# Patient Record
Sex: Female | Born: 1935 | Race: White | Hispanic: No | Smoking: Former smoker
Health system: Southern US, Community
[De-identification: ages and names within clinical notes are randomized; demographics above are authoritative.]

## PROBLEM LIST (undated history)

## (undated) DIAGNOSIS — I10 Essential (primary) hypertension: Secondary | ICD-10-CM

## (undated) DIAGNOSIS — C3490 Malignant neoplasm of unspecified part of unspecified bronchus or lung: Secondary | ICD-10-CM

## (undated) DIAGNOSIS — C801 Malignant (primary) neoplasm, unspecified: Secondary | ICD-10-CM

## (undated) DIAGNOSIS — R05 Cough: Principal | ICD-10-CM

## (undated) HISTORY — DX: Essential (primary) hypertension: I10

## (undated) HISTORY — DX: Malignant neoplasm of unspecified part of unspecified bronchus or lung: C34.90

## (undated) HISTORY — DX: Cough: R05

---

## 2004-07-11 ENCOUNTER — Ambulatory Visit: Payer: Self-pay | Admitting: Internal Medicine

## 2004-08-07 ENCOUNTER — Ambulatory Visit: Payer: Self-pay | Admitting: Internal Medicine

## 2005-11-14 ENCOUNTER — Ambulatory Visit: Payer: Self-pay | Admitting: Internal Medicine

## 2006-07-09 ENCOUNTER — Ambulatory Visit: Payer: Self-pay | Admitting: Ophthalmology

## 2006-07-16 ENCOUNTER — Ambulatory Visit: Payer: Self-pay | Admitting: Ophthalmology

## 2006-11-19 ENCOUNTER — Ambulatory Visit: Payer: Self-pay | Admitting: Internal Medicine

## 2007-10-19 ENCOUNTER — Ambulatory Visit: Payer: Self-pay | Admitting: Oncology

## 2007-10-22 ENCOUNTER — Ambulatory Visit: Payer: Self-pay | Admitting: Internal Medicine

## 2007-10-31 ENCOUNTER — Ambulatory Visit: Payer: Self-pay | Admitting: Oncology

## 2007-11-04 ENCOUNTER — Ambulatory Visit: Payer: Self-pay | Admitting: Oncology

## 2007-11-14 ENCOUNTER — Ambulatory Visit: Payer: Self-pay | Admitting: Oncology

## 2007-11-19 ENCOUNTER — Ambulatory Visit: Payer: Self-pay | Admitting: Oncology

## 2007-11-26 ENCOUNTER — Other Ambulatory Visit: Payer: Self-pay

## 2007-11-26 ENCOUNTER — Ambulatory Visit: Payer: Self-pay | Admitting: General Surgery

## 2007-12-01 ENCOUNTER — Ambulatory Visit: Payer: Self-pay | Admitting: Oncology

## 2007-12-03 ENCOUNTER — Inpatient Hospital Stay: Payer: Self-pay | Admitting: General Surgery

## 2007-12-19 ENCOUNTER — Ambulatory Visit: Payer: Self-pay | Admitting: Oncology

## 2008-01-19 ENCOUNTER — Ambulatory Visit: Payer: Self-pay | Admitting: Oncology

## 2008-02-18 ENCOUNTER — Ambulatory Visit: Payer: Self-pay | Admitting: Oncology

## 2008-03-20 ENCOUNTER — Ambulatory Visit: Payer: Self-pay | Admitting: Oncology

## 2008-04-06 ENCOUNTER — Ambulatory Visit: Payer: Self-pay | Admitting: Internal Medicine

## 2008-04-20 ENCOUNTER — Ambulatory Visit: Payer: Self-pay | Admitting: Oncology

## 2008-05-20 ENCOUNTER — Ambulatory Visit: Payer: Self-pay | Admitting: Oncology

## 2008-06-20 ENCOUNTER — Ambulatory Visit: Payer: Self-pay | Admitting: Oncology

## 2008-07-02 ENCOUNTER — Ambulatory Visit: Payer: Self-pay | Admitting: General Surgery

## 2008-07-20 ENCOUNTER — Ambulatory Visit: Payer: Self-pay | Admitting: Oncology

## 2008-08-03 ENCOUNTER — Ambulatory Visit: Payer: Self-pay | Admitting: Oncology

## 2008-08-05 ENCOUNTER — Ambulatory Visit: Payer: Self-pay | Admitting: Oncology

## 2008-08-20 ENCOUNTER — Ambulatory Visit: Payer: Self-pay | Admitting: Oncology

## 2008-08-20 DIAGNOSIS — C801 Malignant (primary) neoplasm, unspecified: Secondary | ICD-10-CM

## 2008-08-20 HISTORY — DX: Malignant (primary) neoplasm, unspecified: C80.1

## 2008-11-18 ENCOUNTER — Ambulatory Visit: Payer: Self-pay | Admitting: Oncology

## 2008-12-03 ENCOUNTER — Ambulatory Visit: Payer: Self-pay | Admitting: Oncology

## 2008-12-18 ENCOUNTER — Ambulatory Visit: Payer: Self-pay | Admitting: Oncology

## 2009-04-12 ENCOUNTER — Ambulatory Visit: Payer: Self-pay | Admitting: Internal Medicine

## 2009-05-20 ENCOUNTER — Ambulatory Visit: Payer: Self-pay | Admitting: Oncology

## 2009-05-23 ENCOUNTER — Ambulatory Visit: Payer: Self-pay | Admitting: Oncology

## 2009-06-01 ENCOUNTER — Ambulatory Visit: Payer: Self-pay | Admitting: Oncology

## 2009-06-20 ENCOUNTER — Ambulatory Visit: Payer: Self-pay | Admitting: Oncology

## 2009-08-20 HISTORY — PX: BREAST BIOPSY: SHX20

## 2009-09-20 ENCOUNTER — Ambulatory Visit: Payer: Self-pay | Admitting: Oncology

## 2009-09-28 ENCOUNTER — Ambulatory Visit: Payer: Self-pay | Admitting: Oncology

## 2009-10-18 ENCOUNTER — Ambulatory Visit: Payer: Self-pay | Admitting: Oncology

## 2009-11-18 ENCOUNTER — Ambulatory Visit: Payer: Self-pay | Admitting: Oncology

## 2009-12-06 ENCOUNTER — Ambulatory Visit: Payer: Self-pay | Admitting: Oncology

## 2009-12-14 ENCOUNTER — Ambulatory Visit: Payer: Self-pay | Admitting: Oncology

## 2010-05-02 ENCOUNTER — Ambulatory Visit: Payer: Self-pay | Admitting: Internal Medicine

## 2010-05-05 ENCOUNTER — Ambulatory Visit: Payer: Self-pay | Admitting: Internal Medicine

## 2010-06-19 ENCOUNTER — Ambulatory Visit: Payer: Self-pay | Admitting: Oncology

## 2010-06-20 ENCOUNTER — Ambulatory Visit: Payer: Self-pay | Admitting: Oncology

## 2010-07-03 ENCOUNTER — Ambulatory Visit: Payer: Self-pay | Admitting: Surgery

## 2010-11-06 ENCOUNTER — Ambulatory Visit: Payer: Self-pay | Admitting: Internal Medicine

## 2010-12-07 ENCOUNTER — Ambulatory Visit: Payer: Self-pay | Admitting: Oncology

## 2010-12-11 ENCOUNTER — Ambulatory Visit: Payer: Self-pay | Admitting: Oncology

## 2010-12-19 ENCOUNTER — Ambulatory Visit: Payer: Self-pay | Admitting: Oncology

## 2010-12-21 ENCOUNTER — Ambulatory Visit: Payer: Self-pay | Admitting: Unknown Physician Specialty

## 2010-12-25 ENCOUNTER — Ambulatory Visit: Payer: Self-pay | Admitting: Unknown Physician Specialty

## 2010-12-26 ENCOUNTER — Ambulatory Visit: Payer: Self-pay | Admitting: Unknown Physician Specialty

## 2010-12-27 ENCOUNTER — Ambulatory Visit: Payer: Self-pay | Admitting: Oncology

## 2010-12-28 LAB — PATHOLOGY REPORT

## 2011-01-16 ENCOUNTER — Ambulatory Visit: Payer: Self-pay

## 2011-02-15 ENCOUNTER — Ambulatory Visit: Payer: Self-pay | Admitting: General Surgery

## 2011-06-07 ENCOUNTER — Ambulatory Visit: Payer: Self-pay | Admitting: Oncology

## 2011-06-08 ENCOUNTER — Ambulatory Visit: Payer: Self-pay

## 2011-06-21 ENCOUNTER — Ambulatory Visit: Payer: Self-pay | Admitting: Oncology

## 2011-09-07 ENCOUNTER — Ambulatory Visit: Payer: Self-pay | Admitting: Oncology

## 2011-09-10 ENCOUNTER — Ambulatory Visit: Payer: Self-pay | Admitting: Oncology

## 2011-09-11 ENCOUNTER — Ambulatory Visit: Payer: Self-pay | Admitting: Oncology

## 2011-09-12 LAB — COMPREHENSIVE METABOLIC PANEL
Albumin: 4 g/dL (ref 3.4–5.0)
Anion Gap: 8 (ref 7–16)
BUN: 27 mg/dL — ABNORMAL HIGH (ref 7–18)
Chloride: 99 mmol/L (ref 98–107)
Co2: 30 mmol/L (ref 21–32)
Creatinine: 1.32 mg/dL — ABNORMAL HIGH (ref 0.60–1.30)
EGFR (African American): 50 — ABNORMAL LOW
EGFR (Non-African Amer.): 41 — ABNORMAL LOW
Potassium: 3.9 mmol/L (ref 3.5–5.1)
SGOT(AST): 21 U/L (ref 15–37)
SGPT (ALT): 23 U/L
Sodium: 137 mmol/L (ref 136–145)

## 2011-09-12 LAB — CBC CANCER CENTER
Basophil #: 0 x10 3/mm (ref 0.0–0.1)
Basophil %: 0.5 %
Eosinophil %: 0.8 %
HCT: 36.7 % (ref 35.0–47.0)
HGB: 13.6 g/dL (ref 12.0–16.0)
Lymphocyte #: 1.7 x10 3/mm (ref 1.0–3.6)
MCH: 35.6 pg — ABNORMAL HIGH (ref 26.0–34.0)
MCV: 96 fL (ref 80–100)
Monocyte #: 0.7 x10 3/mm (ref 0.0–0.7)
Monocyte %: 8.3 %
Neutrophil #: 6.2 x10 3/mm (ref 1.4–6.5)
Platelet: 255 x10 3/mm (ref 150–440)
RBC: 3.82 10*6/uL (ref 3.80–5.20)

## 2011-09-21 ENCOUNTER — Ambulatory Visit: Payer: Self-pay | Admitting: Oncology

## 2012-01-17 ENCOUNTER — Ambulatory Visit: Payer: Self-pay | Admitting: Oncology

## 2012-01-17 LAB — COMPREHENSIVE METABOLIC PANEL
Albumin: 4.1 g/dL (ref 3.4–5.0)
BUN: 28 mg/dL — ABNORMAL HIGH (ref 7–18)
Chloride: 101 mmol/L (ref 98–107)
Co2: 26 mmol/L (ref 21–32)
Creatinine: 1.03 mg/dL (ref 0.60–1.30)
EGFR (Non-African Amer.): 52 — ABNORMAL LOW
Glucose: 89 mg/dL (ref 65–99)
Osmolality: 277 (ref 275–301)
SGPT (ALT): 26 U/L
Sodium: 136 mmol/L (ref 136–145)
Total Protein: 7.6 g/dL (ref 6.4–8.2)

## 2012-01-17 LAB — CBC CANCER CENTER
Basophil #: 0 x10 3/mm (ref 0.0–0.1)
Basophil %: 0.7 %
Eosinophil #: 0.1 x10 3/mm (ref 0.0–0.7)
HCT: 38.8 % (ref 35.0–47.0)
HGB: 13.1 g/dL (ref 12.0–16.0)
Lymphocyte #: 1.7 x10 3/mm (ref 1.0–3.6)
Lymphocyte %: 27 %
MCHC: 33.8 g/dL (ref 32.0–36.0)
MCV: 93 fL (ref 80–100)
Monocyte #: 0.6 x10 3/mm (ref 0.2–0.9)
Monocyte %: 9.3 %
RDW: 14.3 % (ref 11.5–14.5)
WBC: 6.4 x10 3/mm (ref 3.6–11.0)

## 2012-01-17 LAB — TSH: Thyroid Stimulating Horm: 0.743 u[IU]/mL

## 2012-01-19 ENCOUNTER — Ambulatory Visit: Payer: Self-pay | Admitting: Oncology

## 2012-02-18 ENCOUNTER — Ambulatory Visit: Payer: Self-pay | Admitting: Oncology

## 2012-05-27 ENCOUNTER — Ambulatory Visit: Payer: Self-pay | Admitting: Oncology

## 2012-07-22 ENCOUNTER — Ambulatory Visit: Payer: Self-pay | Admitting: Oncology

## 2012-07-22 LAB — CBC CANCER CENTER
Basophil %: 0.7 %
Lymphocyte #: 1.5 x10 3/mm (ref 1.0–3.6)
Lymphocyte %: 22.9 %
MCH: 32 pg (ref 26.0–34.0)
MCV: 92 fL (ref 80–100)
Monocyte %: 9 %
Neutrophil #: 4.4 x10 3/mm (ref 1.4–6.5)
Platelet: 235 x10 3/mm (ref 150–440)
RBC: 4.28 10*6/uL (ref 3.80–5.20)
RDW: 13.5 % (ref 11.5–14.5)

## 2012-07-22 LAB — COMPREHENSIVE METABOLIC PANEL
Anion Gap: 10 (ref 7–16)
BUN: 26 mg/dL — ABNORMAL HIGH (ref 7–18)
Bilirubin,Total: 0.6 mg/dL (ref 0.2–1.0)
Calcium, Total: 10.2 mg/dL — ABNORMAL HIGH (ref 8.5–10.1)
Chloride: 100 mmol/L (ref 98–107)
Co2: 27 mmol/L (ref 21–32)
Creatinine: 1.17 mg/dL (ref 0.60–1.30)
EGFR (African American): 52 — ABNORMAL LOW
EGFR (Non-African Amer.): 45 — ABNORMAL LOW
Osmolality: 278 (ref 275–301)
Potassium: 4.3 mmol/L (ref 3.5–5.1)
Sodium: 137 mmol/L (ref 136–145)
Total Protein: 7.5 g/dL (ref 6.4–8.2)

## 2012-08-20 ENCOUNTER — Ambulatory Visit: Payer: Self-pay | Admitting: Oncology

## 2012-11-20 ENCOUNTER — Ambulatory Visit: Payer: Self-pay | Admitting: Oncology

## 2012-11-21 ENCOUNTER — Ambulatory Visit: Payer: Self-pay | Admitting: Oncology

## 2012-11-24 LAB — COMPREHENSIVE METABOLIC PANEL
Albumin: 4.2 g/dL (ref 3.4–5.0)
Alkaline Phosphatase: 89 U/L (ref 50–136)
Anion Gap: 7 (ref 7–16)
Bilirubin,Total: 0.7 mg/dL (ref 0.2–1.0)
Calcium, Total: 11.2 mg/dL — ABNORMAL HIGH (ref 8.5–10.1)
Chloride: 99 mmol/L (ref 98–107)
Creatinine: 1.33 mg/dL — ABNORMAL HIGH (ref 0.60–1.30)
EGFR (African American): 44 — ABNORMAL LOW
Glucose: 91 mg/dL (ref 65–99)
Osmolality: 277 (ref 275–301)
Potassium: 3.8 mmol/L (ref 3.5–5.1)
SGOT(AST): 24 U/L (ref 15–37)
Total Protein: 7.9 g/dL (ref 6.4–8.2)

## 2012-11-24 LAB — CBC CANCER CENTER
Eosinophil %: 3.3 %
HCT: 43.1 % (ref 35.0–47.0)
HGB: 14.6 g/dL (ref 12.0–16.0)
Lymphocyte #: 1.6 x10 3/mm (ref 1.0–3.6)
Lymphocyte %: 22.7 %
MCH: 31 pg (ref 26.0–34.0)
MCHC: 33.8 g/dL (ref 32.0–36.0)
MCV: 92 fL (ref 80–100)
Monocyte #: 0.6 x10 3/mm (ref 0.2–0.9)
Monocyte %: 8.7 %
Neutrophil #: 4.6 x10 3/mm (ref 1.4–6.5)
Neutrophil %: 64.5 %
Platelet: 229 x10 3/mm (ref 150–440)
RBC: 4.7 10*6/uL (ref 3.80–5.20)
WBC: 7.1 x10 3/mm (ref 3.6–11.0)

## 2012-12-01 LAB — CBC CANCER CENTER
Basophil %: 0.2 %
Eosinophil #: 0.2 x10 3/mm (ref 0.0–0.7)
Eosinophil %: 3.5 %
HGB: 13.6 g/dL (ref 12.0–16.0)
Lymphocyte %: 26.1 %
MCHC: 35.1 g/dL (ref 32.0–36.0)
MCV: 92 fL (ref 80–100)
Monocyte #: 0.6 x10 3/mm (ref 0.2–0.9)
Monocyte %: 9.1 %
Neutrophil #: 4.2 x10 3/mm (ref 1.4–6.5)
Platelet: 248 x10 3/mm (ref 150–440)
RBC: 4.2 10*6/uL (ref 3.80–5.20)
RDW: 13.6 % (ref 11.5–14.5)

## 2012-12-01 LAB — COMPREHENSIVE METABOLIC PANEL
Albumin: 3.9 g/dL (ref 3.4–5.0)
Alkaline Phosphatase: 83 U/L (ref 50–136)
Anion Gap: 6 — ABNORMAL LOW (ref 7–16)
Calcium, Total: 9.6 mg/dL (ref 8.5–10.1)
Chloride: 99 mmol/L (ref 98–107)
Co2: 28 mmol/L (ref 21–32)
Glucose: 92 mg/dL (ref 65–99)
Osmolality: 269 (ref 275–301)
SGOT(AST): 21 U/L (ref 15–37)
SGPT (ALT): 19 U/L (ref 12–78)
Sodium: 133 mmol/L — ABNORMAL LOW (ref 136–145)
Total Protein: 7.3 g/dL (ref 6.4–8.2)

## 2012-12-18 ENCOUNTER — Ambulatory Visit: Payer: Self-pay | Admitting: Oncology

## 2013-02-25 ENCOUNTER — Ambulatory Visit: Payer: Self-pay

## 2013-03-20 ENCOUNTER — Ambulatory Visit: Payer: Self-pay | Admitting: Oncology

## 2013-03-23 LAB — COMPREHENSIVE METABOLIC PANEL
Albumin: 3.9 g/dL (ref 3.4–5.0)
Anion Gap: 7 (ref 7–16)
BUN: 18 mg/dL (ref 7–18)
Bilirubin,Total: 0.8 mg/dL (ref 0.2–1.0)
Calcium, Total: 9.4 mg/dL (ref 8.5–10.1)
Chloride: 96 mmol/L — ABNORMAL LOW (ref 98–107)
Co2: 29 mmol/L (ref 21–32)
EGFR (African American): 58 — ABNORMAL LOW
Potassium: 4 mmol/L (ref 3.5–5.1)
SGOT(AST): 20 U/L (ref 15–37)
SGPT (ALT): 18 U/L (ref 12–78)
Total Protein: 7.2 g/dL (ref 6.4–8.2)

## 2013-03-23 LAB — CBC CANCER CENTER
Eosinophil #: 0.1 x10 3/mm (ref 0.0–0.7)
HGB: 13.9 g/dL (ref 12.0–16.0)
Lymphocyte #: 1.3 x10 3/mm (ref 1.0–3.6)
Lymphocyte %: 23.3 %
MCV: 100 fL (ref 80–100)
Monocyte #: 0.5 x10 3/mm (ref 0.2–0.9)
Neutrophil #: 3.6 x10 3/mm (ref 1.4–6.5)
WBC: 5.5 x10 3/mm (ref 3.6–11.0)

## 2013-04-20 ENCOUNTER — Ambulatory Visit: Payer: Self-pay | Admitting: Oncology

## 2013-07-23 ENCOUNTER — Ambulatory Visit: Payer: Self-pay | Admitting: Oncology

## 2013-07-27 ENCOUNTER — Ambulatory Visit: Payer: Self-pay | Admitting: Oncology

## 2013-07-27 LAB — CBC CANCER CENTER
Basophil #: 0.1 x10 3/mm (ref 0.0–0.1)
Eosinophil #: 0.1 x10 3/mm (ref 0.0–0.7)
Eosinophil %: 2.2 %
HGB: 13.7 g/dL (ref 12.0–16.0)
Lymphocyte #: 1.5 x10 3/mm (ref 1.0–3.6)
MCH: 37 pg — ABNORMAL HIGH (ref 26.0–34.0)
MCV: 101 fL — ABNORMAL HIGH (ref 80–100)
Monocyte #: 0.6 x10 3/mm (ref 0.2–0.9)
Monocyte %: 9.3 %
Platelet: 243 x10 3/mm (ref 150–440)
RDW: 14 % (ref 11.5–14.5)

## 2013-07-27 LAB — COMPREHENSIVE METABOLIC PANEL
Albumin: 3.6 g/dL (ref 3.4–5.0)
BUN: 20 mg/dL — ABNORMAL HIGH (ref 7–18)
Bilirubin,Total: 0.6 mg/dL (ref 0.2–1.0)
Calcium, Total: 9.8 mg/dL (ref 8.5–10.1)
Chloride: 103 mmol/L (ref 98–107)
Creatinine: 1.19 mg/dL (ref 0.60–1.30)
EGFR (African American): 50 — ABNORMAL LOW
EGFR (Non-African Amer.): 43 — ABNORMAL LOW
Glucose: 91 mg/dL (ref 65–99)
Potassium: 4.6 mmol/L (ref 3.5–5.1)
SGOT(AST): 22 U/L (ref 15–37)
SGPT (ALT): 19 U/L (ref 12–78)
Sodium: 137 mmol/L (ref 136–145)

## 2013-08-20 ENCOUNTER — Ambulatory Visit: Payer: Self-pay | Admitting: Oncology

## 2014-01-22 ENCOUNTER — Ambulatory Visit: Payer: Self-pay | Admitting: Oncology

## 2014-01-25 LAB — COMPREHENSIVE METABOLIC PANEL
ALBUMIN: 3.8 g/dL (ref 3.4–5.0)
Alkaline Phosphatase: 82 U/L
Anion Gap: 8 (ref 7–16)
BILIRUBIN TOTAL: 0.8 mg/dL (ref 0.2–1.0)
BUN: 20 mg/dL — ABNORMAL HIGH (ref 7–18)
Calcium, Total: 9.9 mg/dL (ref 8.5–10.1)
Chloride: 98 mmol/L (ref 98–107)
Co2: 30 mmol/L (ref 21–32)
Creatinine: 1.15 mg/dL (ref 0.60–1.30)
GFR CALC AF AMER: 52 — AB
GFR CALC NON AF AMER: 45 — AB
Glucose: 86 mg/dL (ref 65–99)
OSMOLALITY: 274 (ref 275–301)
Potassium: 3.8 mmol/L (ref 3.5–5.1)
SGOT(AST): 18 U/L (ref 15–37)
SGPT (ALT): 20 U/L (ref 12–78)
Sodium: 136 mmol/L (ref 136–145)
TOTAL PROTEIN: 7.1 g/dL (ref 6.4–8.2)

## 2014-01-25 LAB — CBC CANCER CENTER
Basophil #: 0 x10 3/mm (ref 0.0–0.1)
Basophil %: 0.8 %
Eosinophil #: 0.1 x10 3/mm (ref 0.0–0.7)
Eosinophil %: 2.3 %
HCT: 38.6 % (ref 35.0–47.0)
HGB: 14 g/dL (ref 12.0–16.0)
Lymphocyte #: 1.2 x10 3/mm (ref 1.0–3.6)
Lymphocyte %: 19.9 %
MCH: 36.8 pg — ABNORMAL HIGH (ref 26.0–34.0)
MCHC: 36.4 g/dL — ABNORMAL HIGH (ref 32.0–36.0)
MCV: 101 fL — ABNORMAL HIGH (ref 80–100)
MONO ABS: 0.5 x10 3/mm (ref 0.2–0.9)
Monocyte %: 8.7 %
Neutrophil #: 4 x10 3/mm (ref 1.4–6.5)
Neutrophil %: 68.3 %
PLATELETS: 217 x10 3/mm (ref 150–440)
RBC: 3.82 10*6/uL (ref 3.80–5.20)
RDW: 14 % (ref 11.5–14.5)
WBC: 5.8 x10 3/mm (ref 3.6–11.0)

## 2014-02-17 ENCOUNTER — Ambulatory Visit: Payer: Self-pay | Admitting: Oncology

## 2014-05-18 ENCOUNTER — Ambulatory Visit: Payer: Self-pay

## 2014-06-01 ENCOUNTER — Ambulatory Visit: Payer: Self-pay | Admitting: Ophthalmology

## 2014-06-01 DIAGNOSIS — Z01810 Encounter for preprocedural cardiovascular examination: Secondary | ICD-10-CM

## 2014-06-01 DIAGNOSIS — I10 Essential (primary) hypertension: Secondary | ICD-10-CM

## 2014-06-01 LAB — POTASSIUM: Potassium: 3.9 mmol/L (ref 3.5–5.1)

## 2014-06-24 ENCOUNTER — Ambulatory Visit: Payer: Self-pay | Admitting: Ophthalmology

## 2014-07-27 ENCOUNTER — Ambulatory Visit: Payer: Self-pay | Admitting: Oncology

## 2014-07-27 LAB — COMPREHENSIVE METABOLIC PANEL
Albumin: 3.8 g/dL (ref 3.4–5.0)
Alkaline Phosphatase: 87 U/L
Anion Gap: 6 — ABNORMAL LOW (ref 7–16)
BUN: 29 mg/dL — ABNORMAL HIGH (ref 7–18)
Bilirubin,Total: 0.7 mg/dL (ref 0.2–1.0)
CREATININE: 1.43 mg/dL — AB (ref 0.60–1.30)
Calcium, Total: 10.2 mg/dL — ABNORMAL HIGH (ref 8.5–10.1)
Chloride: 101 mmol/L (ref 98–107)
Co2: 31 mmol/L (ref 21–32)
EGFR (African American): 45 — ABNORMAL LOW
EGFR (Non-African Amer.): 38 — ABNORMAL LOW
Glucose: 83 mg/dL (ref 65–99)
OSMOLALITY: 281 (ref 275–301)
POTASSIUM: 3.8 mmol/L (ref 3.5–5.1)
SGOT(AST): 19 U/L (ref 15–37)
SGPT (ALT): 20 U/L
SODIUM: 138 mmol/L (ref 136–145)
Total Protein: 7.3 g/dL (ref 6.4–8.2)

## 2014-07-27 LAB — CBC CANCER CENTER
BASOS PCT: 1 %
Basophil #: 0.1 x10 3/mm (ref 0.0–0.1)
Eosinophil #: 0.1 x10 3/mm (ref 0.0–0.7)
Eosinophil %: 2 %
HCT: 41.2 % (ref 35.0–47.0)
HGB: 14.3 g/dL (ref 12.0–16.0)
LYMPHS PCT: 18.9 %
Lymphocyte #: 1.2 x10 3/mm (ref 1.0–3.6)
MCH: 32.1 pg (ref 26.0–34.0)
MCHC: 34.7 g/dL (ref 32.0–36.0)
MCV: 92 fL (ref 80–100)
MONO ABS: 0.6 x10 3/mm (ref 0.2–0.9)
Monocyte %: 8.6 %
Neutrophil #: 4.5 x10 3/mm (ref 1.4–6.5)
Neutrophil %: 69.5 %
PLATELETS: 257 x10 3/mm (ref 150–440)
RBC: 4.46 10*6/uL (ref 3.80–5.20)
RDW: 13.9 % (ref 11.5–14.5)
WBC: 6.5 x10 3/mm (ref 3.6–11.0)

## 2014-08-20 ENCOUNTER — Ambulatory Visit: Payer: Self-pay | Admitting: Oncology

## 2014-12-11 NOTE — Op Note (Signed)
PATIENT NAME:  Stephanie Collins, Stephanie Collins MR#:  [id number] DATE OF BIRTH:  07/12/1932  DATE OF PROCEDURE:  06/24/2014  PREOPERATIVE DIAGNOSIS:  Nuclear sclerotic cataract, left eye.  POSTOPERATIVE DIAGNOSIS:  Nuclear sclerotic cataract, left eye. H25.12  PROCEDURE:  Phacoemulsification with posterior chamber intraocular lens left eye, model SN60WF  SURGEON:  Lyla Glassing, MD  INDICATIONS:  This is an 81 year old female with decreased vision in the left eye.  PROCEDURE:  The risks and benefits of cataract surgery were discussed at length with the patient, including bleeding, infection, retinal detachment, re-operation, diplopia, ptosis, loss of vision, and loss of the eye. Informed consent was obtained. On the day of surgery, several sets of preoperative medication were administered to the operative eye including 0.5% tetracaine,1% cyclopentolate, 10% phenylephrine, 0.5% ketorolac, 0.5% gatifloxacin, and 2% lidocaine .  The patient was taken to the operating room and sedated via IV sedation. Topical tetracaine was placed in the eye. The operative eye was prepped using a 10% Betadine solution and then covered in sterile drapes leaving only the operative eye exposed. A Lieberman lid speculum was placed to provide exposure. Using 0.12 forceps and a side-port blade, a paracentesis was created. Then a mixture of BSS, preservative free lidocaine, and epinephrine was injected into the anterior chamber. Next, a 2.4 mm keratome blade was used to create a two-step full-thickness clear corneal incision temporally. The cystitome and Utrata forceps were used to create a continuous capsulorrhexis in the anterior lens capsule. BSS on a hydrodissection cannula was used to perform gentle hydrodissection. Phacoemulsification was then performed to remove the nucleus. Irrigation and aspiration was performed to remove the remaining cortical material. Provisc was injected to fill the capsular bag and anterior chamber. A 23.5 diopter  SN60WF intraocular lens was injected into the capsular bag. The Connor wand was used to rotate it into proper position in the capsular bag. Irrigation and aspiration was performed to remove the remaining Viscoelastic material from the eye. BSS on a 30-gauge cannula was used to hydrate the wound. An intracameral antibiotic was administered. The wounds were checked and found to be watertight. The lid speculum and drapes were carefully removed. Several drops of Vigamox were placed in the operative eye. The eye was covered with protective eyewear. The patient was taken to the recovery area in good condition. There were no complications.   ____________________________ Lyla Glassing, MD nm:at D: 06/24/2014 11:38:26 ET T: 06/24/2014 13:21:21 ET JOB#: [id number]  cc: Lyla Glassing, MD, <Dictator> Lyla Glassing MD ELECTRONICALLY SIGNED 07/01/2014 13:45

## 2015-01-28 ENCOUNTER — Other Ambulatory Visit: Payer: Self-pay | Admitting: Orthopedic Surgery

## 2015-01-28 DIAGNOSIS — S22070A Wedge compression fracture of T9-T10 vertebra, initial encounter for closed fracture: Secondary | ICD-10-CM

## 2015-01-29 ENCOUNTER — Ambulatory Visit
Admission: RE | Admit: 2015-01-29 | Discharge: 2015-01-29 | Disposition: A | Discharge status: Home / Self Care | Payer: Medicare Other | Source: Ambulatory Visit | Attending: Orthopedic Surgery | Admitting: Orthopedic Surgery

## 2015-01-29 DIAGNOSIS — S22070S Wedge compression fracture of T9-T10 vertebra, sequela: Secondary | ICD-10-CM | POA: Insufficient documentation

## 2015-01-29 DIAGNOSIS — X58XXXS Exposure to other specified factors, sequela: Secondary | ICD-10-CM | POA: Insufficient documentation

## 2015-01-29 DIAGNOSIS — S22070A Wedge compression fracture of T9-T10 vertebra, initial encounter for closed fracture: Secondary | ICD-10-CM

## 2015-02-01 ENCOUNTER — Encounter: Payer: Self-pay | Admitting: *Deleted

## 2015-02-01 ENCOUNTER — Ambulatory Visit: Payer: Medicare Other | Admitting: Anesthesiology

## 2015-02-01 ENCOUNTER — Ambulatory Visit
Admission: RE | Admit: 2015-02-01 | Discharge: 2015-02-01 | Disposition: A | Discharge status: Home / Self Care | Payer: Medicare Other | Source: Ambulatory Visit | Attending: Orthopedic Surgery | Admitting: Orthopedic Surgery

## 2015-02-01 ENCOUNTER — Ambulatory Visit: Payer: Medicare Other

## 2015-02-01 ENCOUNTER — Encounter
Admission: RE | Disposition: A | Discharge status: Home / Self Care | Payer: Self-pay | Source: Ambulatory Visit | Attending: Orthopedic Surgery

## 2015-02-01 DIAGNOSIS — G43909 Migraine, unspecified, not intractable, without status migrainosus: Secondary | ICD-10-CM | POA: Diagnosis not present

## 2015-02-01 DIAGNOSIS — J449 Chronic obstructive pulmonary disease, unspecified: Secondary | ICD-10-CM | POA: Insufficient documentation

## 2015-02-01 DIAGNOSIS — K219 Gastro-esophageal reflux disease without esophagitis: Secondary | ICD-10-CM | POA: Diagnosis not present

## 2015-02-01 DIAGNOSIS — E785 Hyperlipidemia, unspecified: Secondary | ICD-10-CM | POA: Diagnosis not present

## 2015-02-01 DIAGNOSIS — Z419 Encounter for procedure for purposes other than remedying health state, unspecified: Secondary | ICD-10-CM

## 2015-02-01 DIAGNOSIS — I10 Essential (primary) hypertension: Secondary | ICD-10-CM | POA: Insufficient documentation

## 2015-02-01 DIAGNOSIS — M810 Age-related osteoporosis without current pathological fracture: Secondary | ICD-10-CM | POA: Diagnosis not present

## 2015-02-01 DIAGNOSIS — M4854XA Collapsed vertebra, not elsewhere classified, thoracic region, initial encounter for fracture: Secondary | ICD-10-CM | POA: Diagnosis present

## 2015-02-01 DIAGNOSIS — Z79899 Other long term (current) drug therapy: Secondary | ICD-10-CM | POA: Diagnosis not present

## 2015-02-01 DIAGNOSIS — Z85118 Personal history of other malignant neoplasm of bronchus and lung: Secondary | ICD-10-CM | POA: Insufficient documentation

## 2015-02-01 HISTORY — PX: KYPHOPLASTY: SHX5884

## 2015-02-01 LAB — POCT I-STAT 4, (NA,K, GLUC, HGB,HCT) (ARMC MAN. ENTRY)
HCT: 40 % (ref 36–46)
HEMOGLOBIN: 13.6 g/dL (ref 12.0–16.0)
POTASSIUM: 4.2 mmol/L (ref 3.4–5.3)
SODIUM: 107 mmol/L — AB (ref 135–145)

## 2015-02-01 SURGERY — KYPHOPLASTY
Anesthesia: General | Site: Thoracic | Wound class: Clean

## 2015-02-01 MED ORDER — FENTANYL CITRATE (PF) 100 MCG/2ML IJ SOLN: 25.0000 ug | INTRAMUSCULAR | Status: DC | PRN

## 2015-02-01 MED ORDER — CEFAZOLIN (ANCEF) 1 G IV SOLR: 1.0000 g | INTRAVENOUS | Status: DC

## 2015-02-01 MED ORDER — LACTATED RINGERS IV SOLN
INTRAVENOUS | Status: DC
  Administered 2015-02-01: 16:00:00 via INTRAVENOUS

## 2015-02-01 MED ORDER — CEFAZOLIN SODIUM 1-5 GM-% IV SOLN
INTRAVENOUS | Status: AC
  Administered 2015-02-01: 1 g via INTRAVENOUS
  Filled 2015-02-01: qty 50

## 2015-02-01 MED ORDER — ONDANSETRON HCL 4 MG/2ML IJ SOLN: 4.0000 mg | Freq: Once | INTRAMUSCULAR | Status: DC | PRN

## 2015-02-01 MED ORDER — FENTANYL CITRATE (PF) 100 MCG/2ML IJ SOLN
INTRAMUSCULAR | Status: DC | PRN
  Administered 2015-02-01: 50 ug via INTRAVENOUS
  Administered 2015-02-01: 25 ug via INTRAVENOUS

## 2015-02-01 MED ORDER — IOHEXOL 180 MG/ML  SOLN
INTRAMUSCULAR | Status: AC
Start: 2015-02-01 — End: 2015-02-01
  Filled 2015-02-01: qty 40

## 2015-02-01 MED ORDER — HYDROCODONE-ACETAMINOPHEN 5-325 MG PO TABS: 1.0000 | ORAL_TABLET | Freq: Four times a day (QID) | ORAL | Status: DC | PRN

## 2015-02-01 MED ORDER — PROPOFOL INFUSION 10 MG/ML OPTIME
INTRAVENOUS | Status: DC | PRN
  Administered 2015-02-01: 50 ug/kg/min via INTRAVENOUS

## 2015-02-01 MED ORDER — LIDOCAINE HCL (PF) 1 % IJ SOLN
INTRAMUSCULAR | Status: AC
  Filled 2015-02-01: qty 60

## 2015-02-01 MED ORDER — BUPIVACAINE-EPINEPHRINE (PF) 0.5% -1:200000 IJ SOLN
INTRAMUSCULAR | Status: AC
  Filled 2015-02-01: qty 30

## 2015-02-01 SURGICAL SUPPLY — 17 items
CEMENT BONE KYPHON CDS (Cement) ×3 IMPLANT
CEMENT KYPHON CX01A KIT/MIXER (Cement) ×3 IMPLANT
DEVICE BIOPSY BONE KYPHX (INSTRUMENTS) ×3 IMPLANT
DRAPE C-ARM XRAY 36X54 (DRAPES) ×3 IMPLANT
DURAPREP 26ML APPLICATOR (WOUND CARE) ×3 IMPLANT
GLOVE SURG CUT RESIS 520032000 (GLOVE) ×3 IMPLANT
GLOVE SURG ORTHO 9.0 STRL STRW (GLOVE) ×3 IMPLANT
GOWN SPECIALTY ULTRA XL (MISCELLANEOUS) ×3 IMPLANT
GOWN STRL REUS W/ TWL LRG LVL3 (GOWN DISPOSABLE) ×1 IMPLANT
GOWN STRL REUS W/TWL LRG LVL3 (GOWN DISPOSABLE) ×2
LIQUID BAND (GAUZE/BANDAGES/DRESSINGS) ×3 IMPLANT
NDL SAFETY 18GX1.5 (NEEDLE) ×3 IMPLANT
PACK KYPHOPLASTY (MISCELLANEOUS) ×3 IMPLANT
STRAP SAFETY BODY (MISCELLANEOUS) ×3 IMPLANT
TRAY KYPHOPAK 15/3 EXPRESS 1ST (MISCELLANEOUS) ×3 IMPLANT
kyphon inflation syringe ×2 IMPLANT
medtronic sofamor danek size 3 kyphon bone biopsy ×3 IMPLANT

## 2015-02-01 NOTE — Transfer of Care (Signed)
Immediate Anesthesia Transfer of Care Note  Patient: Stephanie Collins  Procedure(s) Performed: Procedure(s) with comments: KYPHOPLASTY (N/A) - T10  Patient Location: PACU  Anesthesia Type:General  Level of Consciousness: awake and patient cooperative  Airway & Oxygen Therapy: Patient Spontanous Breathing and Patient connected to nasal cannula oxygen  Post-op Assessment: Report given to RN  Post vital signs: Reviewed and stable  Last Vitals:  Filed Vitals:   02/01/15 1811  BP: 131/63  Pulse: 84  Temp: 97.8  Resp: 12    Complications: No apparent anesthesia complications

## 2015-02-01 NOTE — Anesthesia Preprocedure Evaluation (Signed)
Anesthesia Evaluation    Airway Mallampati: II       Dental   Pulmonary          Cardiovascular Rhythm:regular Rate:Normal     Neuro/Psych    GI/Hepatic   Endo/Other    Renal/GU      Musculoskeletal   Abdominal   Peds  Hematology   Anesthesia Other Findings   Reproductive/Obstetrics                             Anesthesia Physical Anesthesia Plan  ASA: II  Anesthesia Plan:    Post-op Pain Management:    Induction:   Airway Management Planned:   Additional Equipment:   Intra-op Plan:   Post-operative Plan:   Informed Consent:   Plan Discussed with:   Anesthesia Plan Comments:         Anesthesia Quick Evaluation

## 2015-02-01 NOTE — Discharge Instructions (Addendum)
AMBULATORY SURGERY  DISCHARGE INSTRUCTIONS   1) The drugs that you were given will stay in your system until tomorrow so for the next 24 hours you should not:  A) Drive an automobile B) Make any legal decisions C) Drink any alcoholic beverage   2) You may resume regular meals tomorrow.  Today it is better to start with liquids and gradually work up to solid foods.  You may eat anything you prefer, but it is better to start with liquids, then soup and crackers, and gradually work up to solid foods.   3) Please notify your doctor immediately if you have any unusual bleeding, trouble breathing, redness and pain at the surgery site, drainage, fever, or pain not relieved by medication. 4)   5) Your post-operative visit with Dr.    George Ina                                 is: Date:                        Time:    Please call to schedule your post-operative visit.  6) Additional Instructions: 7) Remove Band-Aid on Thursday okay to shower after that. No lifting today, resume more normal activities tomorrow

## 2015-02-01 NOTE — Op Note (Signed)
02/01/2015  6:20 PM    PATIENT:  Stephanie Collins  81 y.o. female  PRE-OPERATIVE DIAGNOSIS:  COMPRESSION FX T10  POST-OPERATIVE DIAGNOSIS:  COMPRESSION FX T10  PROCEDURE:  Procedure(s) with comments: KYPHOPLASTY (N/A) - T10  SURGEON: Laurene Footman, MD  ASSISTANTS: None  ANESTHESIA:   MAC  EBL:  Total I/O In: 300 [I.V.:300] Out: 5 [Blood:5]  BLOOD ADMINISTERED:none  DRAINS: none   LOCAL MEDICATIONS USED:  MARCAINE    and XYLOCAINE   SPECIMEN:  Source of Specimen:  T10  DISPOSITION OF SPECIMEN:  PATHOLOGY  COUNTS:  YES  TOURNIQUET:  * No tourniquets in log *   IMPLANTS: Bone cement  DICTATION: .Dragon Dictation  patient brought the operating room and after adequate anesthesia was obtained with Mac the patient was placed prone, and local aspect infiltrated near the planned incision. The back was prepped and draped in sterile fashion and repeat timeout procedure and patient identification completed. Spinal needle was used to get down to the right side of T10. A small incision was made after localizing with Sensorcaine and lidocaine with epinephrine were placed. A trocar was advanced to the right T 10 pedicle and a parapedicular approach utilized. Biopsy was obtained of the vertebral body after being certain that the trocar had not entered the spinal canal or neural foramen. Drilling was carried out after biopsy obtained the balloon inflated to 3 cc and then the bone cement mixed and when it was the appropriate consistency proximal before it was infiltrated. It is good crossing of the midline and partial correction of deformity. Trochars removed and permanent C-arm views obtained. Dermabond was used to close the incision and a Band-Aid applied  PLAN OF CARE: Discharge to home after PACU  PATIENT DISPOSITION:  PACU - hemodynamically stable.

## 2015-02-01 NOTE — H&P (Signed)
Reviewed paper H+P, will be scanned into chart. No changes noted.  

## 2015-02-02 ENCOUNTER — Encounter: Payer: Self-pay | Admitting: Orthopedic Surgery

## 2015-02-03 LAB — SURGICAL PATHOLOGY

## 2015-02-14 NOTE — Anesthesia Postprocedure Evaluation (Signed)
  Anesthesia Post-op Note  Patient: Stephanie Collins  Procedure(s) Performed: Procedure(s) with comments: KYPHOPLASTY (N/A) - T10  Anesthesia type:General  Patient location: PACU  Post pain: Pain level controlled  Post assessment: Post-op Vital signs reviewed, Patient's Cardiovascular Status Stable, Respiratory Function Stable, Patent Airway and No signs of Nausea or vomiting  Post vital signs: Reviewed and stable  Last Vitals:  Filed Vitals:   02/01/15 1902  BP: 118/62  Pulse: 79  Temp:   Resp: 18    Level of consciousness: awake, alert  and patient cooperative  Complications: No apparent anesthesia complications

## 2015-02-14 NOTE — Addendum Note (Signed)
Addendum  created 02/14/15 1055 by Molli Barrows, MD   Modules edited: Notes Section   Notes Section:  Delete: [id number]; File: [id number]

## 2015-02-14 NOTE — Anesthesia Postprocedure Evaluation (Deleted)
  Anesthesia Post-op Note  Patient: Stephanie Collins  Procedure(s) Performed: Procedure(s) with comments: KYPHOPLASTY (N/A) - T10  Anesthesia type:General  Patient location: PACU  Post pain: Pain level controlled  Post assessment: Post-op Vital signs reviewed, Patient's Cardiovascular Status Stable, Respiratory Function Stable, Patent Airway and No signs of Nausea or vomiting  Post vital signs: Reviewed and stable  Last Vitals:  Filed Vitals:   02/01/15 1902  BP: 118/62  Pulse: 79  Temp:   Resp: 18    Level of consciousness: awake, alert  and patient cooperative  Complications: No apparent anesthesia complications

## 2015-04-21 DIAGNOSIS — K449 Diaphragmatic hernia without obstruction or gangrene: Secondary | ICD-10-CM | POA: Insufficient documentation

## 2015-04-22 ENCOUNTER — Other Ambulatory Visit: Payer: Self-pay | Admitting: Infectious Diseases

## 2015-04-22 DIAGNOSIS — Z1231 Encounter for screening mammogram for malignant neoplasm of breast: Secondary | ICD-10-CM

## 2015-04-26 DIAGNOSIS — R634 Abnormal weight loss: Secondary | ICD-10-CM | POA: Insufficient documentation

## 2015-05-20 ENCOUNTER — Other Ambulatory Visit: Payer: Self-pay | Admitting: Infectious Diseases

## 2015-05-20 ENCOUNTER — Ambulatory Visit
Admission: RE | Admit: 2015-05-20 | Discharge: 2015-05-20 | Disposition: A | Discharge status: Home / Self Care | Payer: Medicare Other | Source: Ambulatory Visit | Attending: Infectious Diseases | Admitting: Infectious Diseases

## 2015-05-20 DIAGNOSIS — Z1231 Encounter for screening mammogram for malignant neoplasm of breast: Secondary | ICD-10-CM

## 2015-05-20 HISTORY — DX: Malignant (primary) neoplasm, unspecified: C80.1

## 2015-06-23 ENCOUNTER — Other Ambulatory Visit: Payer: Self-pay | Admitting: Student

## 2015-06-23 DIAGNOSIS — R1310 Dysphagia, unspecified: Secondary | ICD-10-CM

## 2015-06-29 ENCOUNTER — Ambulatory Visit
Admission: RE | Admit: 2015-06-29 | Discharge: 2015-06-29 | Disposition: A | Discharge status: Home / Self Care | Payer: Medicare Other | Source: Ambulatory Visit | Attending: Student | Admitting: Student

## 2015-06-29 DIAGNOSIS — K449 Diaphragmatic hernia without obstruction or gangrene: Secondary | ICD-10-CM | POA: Insufficient documentation

## 2015-06-29 DIAGNOSIS — K228 Other specified diseases of esophagus: Secondary | ICD-10-CM | POA: Insufficient documentation

## 2015-06-29 DIAGNOSIS — R1310 Dysphagia, unspecified: Secondary | ICD-10-CM | POA: Diagnosis present

## 2015-07-25 ENCOUNTER — Other Ambulatory Visit: Payer: Self-pay | Admitting: *Deleted

## 2015-07-25 DIAGNOSIS — C3490 Malignant neoplasm of unspecified part of unspecified bronchus or lung: Secondary | ICD-10-CM

## 2015-07-28 ENCOUNTER — Inpatient Hospital Stay: Payer: Medicare Other | Attending: Oncology

## 2015-07-28 ENCOUNTER — Other Ambulatory Visit: Payer: Self-pay | Admitting: *Deleted

## 2015-07-28 ENCOUNTER — Ambulatory Visit
Admission: RE | Admit: 2015-07-28 | Discharge: 2015-07-28 | Disposition: A | Discharge status: Home / Self Care | Payer: Medicare Other | Source: Ambulatory Visit | Attending: Oncology | Admitting: Oncology

## 2015-07-28 ENCOUNTER — Encounter: Payer: Self-pay | Admitting: Oncology

## 2015-07-28 ENCOUNTER — Inpatient Hospital Stay (HOSPITAL_BASED_OUTPATIENT_CLINIC_OR_DEPARTMENT_OTHER): Payer: Medicare Other | Admitting: Oncology

## 2015-07-28 VITALS — BP 132/83 | HR 77 | Temp 95.5°F | Wt 126.3 lb

## 2015-07-28 DIAGNOSIS — M810 Age-related osteoporosis without current pathological fracture: Secondary | ICD-10-CM | POA: Diagnosis not present

## 2015-07-28 DIAGNOSIS — J9801 Acute bronchospasm: Secondary | ICD-10-CM

## 2015-07-28 DIAGNOSIS — R05 Cough: Secondary | ICD-10-CM

## 2015-07-28 DIAGNOSIS — I10 Essential (primary) hypertension: Secondary | ICD-10-CM | POA: Insufficient documentation

## 2015-07-28 DIAGNOSIS — Z87891 Personal history of nicotine dependence: Secondary | ICD-10-CM | POA: Insufficient documentation

## 2015-07-28 DIAGNOSIS — Z9221 Personal history of antineoplastic chemotherapy: Secondary | ICD-10-CM | POA: Insufficient documentation

## 2015-07-28 DIAGNOSIS — Z85118 Personal history of other malignant neoplasm of bronchus and lung: Secondary | ICD-10-CM | POA: Diagnosis not present

## 2015-07-28 DIAGNOSIS — C342 Malignant neoplasm of middle lobe, bronchus or lung: Secondary | ICD-10-CM

## 2015-07-28 DIAGNOSIS — Z79899 Other long term (current) drug therapy: Secondary | ICD-10-CM

## 2015-07-28 DIAGNOSIS — Z9889 Other specified postprocedural states: Secondary | ICD-10-CM | POA: Insufficient documentation

## 2015-07-28 DIAGNOSIS — C3490 Malignant neoplasm of unspecified part of unspecified bronchus or lung: Secondary | ICD-10-CM

## 2015-07-28 DIAGNOSIS — J449 Chronic obstructive pulmonary disease, unspecified: Secondary | ICD-10-CM | POA: Insufficient documentation

## 2015-07-28 DIAGNOSIS — M1990 Unspecified osteoarthritis, unspecified site: Secondary | ICD-10-CM | POA: Insufficient documentation

## 2015-07-28 DIAGNOSIS — Z859 Personal history of malignant neoplasm, unspecified: Secondary | ICD-10-CM | POA: Insufficient documentation

## 2015-07-28 DIAGNOSIS — K449 Diaphragmatic hernia without obstruction or gangrene: Secondary | ICD-10-CM | POA: Diagnosis not present

## 2015-07-28 DIAGNOSIS — Z08 Encounter for follow-up examination after completed treatment for malignant neoplasm: Secondary | ICD-10-CM | POA: Insufficient documentation

## 2015-07-28 LAB — CBC WITH DIFFERENTIAL/PLATELET
BASOS ABS: 0.1 10*3/uL (ref 0–0.1)
Basophils Relative: 1 %
EOS PCT: 2 %
Eosinophils Absolute: 0.1 10*3/uL (ref 0–0.7)
HEMATOCRIT: 42.2 % (ref 35.0–47.0)
HEMOGLOBIN: 14.4 g/dL (ref 12.0–16.0)
LYMPHS PCT: 24 %
Lymphs Abs: 1.4 10*3/uL (ref 1.0–3.6)
MCH: 29.4 pg (ref 26.0–34.0)
MCHC: 34.2 g/dL (ref 32.0–36.0)
MCV: 86 fL (ref 80.0–100.0)
Monocytes Absolute: 0.6 10*3/uL (ref 0.2–0.9)
Monocytes Relative: 10 %
NEUTROS ABS: 3.6 10*3/uL (ref 1.4–6.5)
NEUTROS PCT: 63 %
PLATELETS: 214 10*3/uL (ref 150–440)
RBC: 4.91 MIL/uL (ref 3.80–5.20)
RDW: 13.8 % (ref 11.5–14.5)
WBC: 5.8 10*3/uL (ref 3.6–11.0)

## 2015-07-28 LAB — COMPREHENSIVE METABOLIC PANEL
ALT: 12 U/L — AB (ref 14–54)
AST: 22 U/L (ref 15–41)
Albumin: 4.6 g/dL (ref 3.5–5.0)
Alkaline Phosphatase: 72 U/L (ref 38–126)
Anion gap: 10 (ref 5–15)
BUN: 28 mg/dL — AB (ref 6–20)
CHLORIDE: 97 mmol/L — AB (ref 101–111)
CO2: 25 mmol/L (ref 22–32)
CREATININE: 1.11 mg/dL — AB (ref 0.44–1.00)
Calcium: 9.4 mg/dL (ref 8.9–10.3)
GFR calc Af Amer: 53 mL/min — ABNORMAL LOW (ref >60)
GFR, EST NON AFRICAN AMERICAN: 45 mL/min — AB (ref >60)
Glucose, Bld: 105 mg/dL — ABNORMAL HIGH (ref 65–99)
Potassium: 3.9 mmol/L (ref 3.5–5.1)
Sodium: 132 mmol/L — ABNORMAL LOW (ref 135–145)
Total Bilirubin: 0.9 mg/dL (ref 0.3–1.2)
Total Protein: 7.5 g/dL (ref 6.5–8.1)

## 2015-07-28 MED ORDER — IPRATROPIUM-ALBUTEROL 18-103 MCG/ACT IN AERO: 1.0000 | INHALATION_SPRAY | Freq: Four times a day (QID) | RESPIRATORY_TRACT | Status: DC

## 2015-07-30 ENCOUNTER — Encounter: Payer: Self-pay | Admitting: Oncology

## 2015-07-31 ENCOUNTER — Encounter: Payer: Self-pay | Admitting: Oncology

## 2015-07-31 NOTE — Progress Notes (Signed)
[place] @ [facility] Telephone:([phone]) [phone]  Fax:([phone]     LORAYNE GETCHELL OB: [birth year]  MR#: [id number]  JWL#:[id number]  Patient Care Team: Adrian Prows, MD as PCP - General (Infectious Diseases)  CHIEF COMPLAINT:  Chief Complaint  Patient presents with  . Lung Cancer     No history exists.    No flowsheet data found.  INTERVAL HISTORY:  Chief Complaint/Diagnosis:   1. CT scan of the chest revealed 3.7 cm soft tissue mass involving medial right lower lobeneedle biopsies positive for adenocarcinomadiagnosis in March of 2009. Clinically, stage Ib 2. Had, abnormal uptake with SUV of 9 in the left parotid gland biopsy of, which was negative 3. April, 2009, Status post lobectomy pathological staging T3, N0, M0, stage IIB 4. Finished adjuvant therapy 4 cycles with cisplatinAnd Alimta INTERVAL HISTORY:  Patient is here for ongoing evaluation and follow-up.  Regarding carcinoma of lung.  Patient has stopped smoking.  Continues to have dry hacking cough taking left Tessalon pearls.  Had a chest x-ray done. No hemoptysis.  Appetite has been stable.  Getting regular mammogram done as well as at a flu shot by primary care physician REVIEW OF SYSTEMS:   GENERAL:  Feels good.  Active.  No fevers, sweats or weight loss. PERFORMANCE STATUS (ECOG): 01 HEENT:  No visual changes, runny nose, sore throat, mouth sores or tenderness. Lungs: Dry hacking cough as described above Cardiac:  No chest pain, palpitations, orthopnea, or PND. GI:  No nausea, vomiting, diarrhea, constipation, melena or hematochezia. GU:  No urgency, frequency, dysuria, or hematuria. Musculoskeletal:  No back pain.  No joint pain.  No muscle tenderness. Extremities:  No pain or swelling. Skin:  No rashes or skin changes. Neuro:  No headache, numbness or weakness, balance or coordination issues. Endocrine:  No diabetes, thyroid issues, hot flashes or night sweats. Psych:  No mood changes, depression or  anxiety. Pain:  No focal pain. Review of systems:  All other systems reviewed and found to be negative. As per HPI. Otherwise, a complete review of systems is negatve.  PAST MEDICAL HISTORY: Past Medical History  Diagnosis Date  . Cancer ([place]) 2010    lung    PAST SURGICAL HISTORY: Past Surgical History  Procedure Laterality Date  . Kyphoplasty N/A 02/01/2015    Procedure: KYPHOPLASTY;  Surgeon: Hessie Knows, MD;  Location: [facility] ORS;  Service: Orthopedics;  Laterality: N/A;  T10   Significant History/PMH:   Osteoporosis:    WARMER FOR LAB STICKS: Due to severe RBC agglutination   AAA - Abdominal Aortic Anuerysm:    Indigestion:    Hiatal Hernia:    Chronic cough:    Migraine headaches:    Asthma:    Arthritis:    Phlebitis:    HTN:    Lung Ca:    Right lower lobectomy and thoracotomy: 03-Dec-2007   right cataract removal:    left hip fracture: 2002   right parotid gland removal:    Hysterectomy - Partial:   Smoking History: Smoking History Never Smoked.(1)  PFSH: Family History: noncontributory  Social History: noncontributory  Additional Past Medical and Surgical History: resection of PAROTID  gland, hysterectomyaccording to patient,PAROTID gland. Tumor was benign  Used  to smoke in the past, but now quit smoking  Does not drink    ADVANCED DIRECTIVES:  Patient does not have any living will or healthcare power of attorney.  Information was given .  Available resources had been discussed.  We will follow-up on  subsequent appointments regarding this issue  HEALTH MAINTENANCE: Social History  Substance Use Topics  . Smoking status: Former Research scientist (life sciences)  . Smokeless tobacco: None  . Alcohol Use: None      Allergies  Allergen Reactions  . Tramadol     confusion    Current Outpatient Prescriptions  Medication Sig Dispense Refill  . benzonatate (TESSALON) 200 MG capsule Take by mouth.    . Fluticasone-Salmeterol (ADVAIR) 250-50 MCG/DOSE AEPB  Inhale 1 puff into the lungs 2 (two) times daily.    Marland Kitchen triamterene-hydrochlorothiazide (MAXZIDE-25) 37.5-25 MG per tablet Take 1 tablet by mouth daily.    Marland Kitchen albuterol-ipratropium (COMBIVENT) 18-103 MCG/ACT inhaler Inhale 1 puff into the lungs 4 (four) times daily. 1 Inhaler 1   No current facility-administered medications for this visit.    OBJECTIVE:  Filed Vitals:   07/28/15 1059  BP: 132/83  Pulse: 77  Temp: 95.5 F (35.3 C)     Body mass index is 21.02 kg/(m^2).    ECOG FS:1 - Symptomatic but completely ambulatory  PHYSICAL EXAM: GENERAL:  Well developed, well nourished, sitting comfortably in the exam room in no acute distress. MENTAL STATUS:  Alert and oriented to person, place and time.  RESPIRATORY:  Emphysematous chest.  Bilateral rhonchi. CARDIOVASCULAR:  Regular rate and rhythm without murmur, rub or gallop. BREAST:  Right breast without masses, skin changes or nipple discharge.  Left breast without masses, skin changes or nipple discharge. ABDOMEN:  Soft, non-tender, with active bowel sounds, and no hepatosplenomegaly.  No masses. BACK:  No CVA tenderness.  No tenderness on percussion of the back or rib cage. SKIN:  No rashes, ulcers or lesions. EXTREMITIES: No edema, no skin discoloration or tenderness.  No palpable cords. LYMPH NODES: No palpable cervical, supraclavicular, axillary or inguinal adenopathy  NEUROLOGICAL: Unremarkable. PSYCH:  Appropriate.   LAB RESULTS:  CBC Latest Ref Rng 07/28/2015 02/01/2015  WBC 3.6 - 11.0 K/uL 5.8 -  Hemoglobin 12.0 - 16.0 g/dL 14.4 13.6  Hematocrit 35.0 - 47.0 % 42.2 40  Platelets 150 - 440 K/uL 214 -    Appointment on 07/28/2015  Component Date Value Ref Range Status  . WBC 07/28/2015 5.8  3.6 - 11.0 K/uL Final  . RBC 07/28/2015 4.91  3.80 - 5.20 MIL/uL Final  . Hemoglobin 07/28/2015 14.4  12.0 - 16.0 g/dL Final  . HCT 07/28/2015 42.2  35.0 - 47.0 % Final  . MCV 07/28/2015 86.0  80.0 - 100.0 fL Final  . MCH 07/28/2015  29.4  26.0 - 34.0 pg Final  . MCHC 07/28/2015 34.2  32.0 - 36.0 g/dL Final  . RDW 07/28/2015 13.8  11.5 - 14.5 % Final  . Platelets 07/28/2015 214  150 - 440 K/uL Final  . Neutrophils Relative % 07/28/2015 63   Final  . Neutro Abs 07/28/2015 3.6  1.4 - 6.5 K/uL Final  . Lymphocytes Relative 07/28/2015 24   Final  . Lymphs Abs 07/28/2015 1.4  1.0 - 3.6 K/uL Final  . Monocytes Relative 07/28/2015 10   Final  . Monocytes Absolute 07/28/2015 0.6  0.2 - 0.9 K/uL Final  . Eosinophils Relative 07/28/2015 2   Final  . Eosinophils Absolute 07/28/2015 0.1  0 - 0.7 K/uL Final  . Basophils Relative 07/28/2015 1   Final  . Basophils Absolute 07/28/2015 0.1  0 - 0.1 K/uL Final  . Sodium 07/28/2015 132* 135 - 145 mmol/L Final  . Potassium 07/28/2015 3.9  3.5 - 5.1 mmol/L Final  . Chloride 07/28/2015  97* 101 - 111 mmol/L Final  . CO2 07/28/2015 25  22 - 32 mmol/L Final  . Glucose, Bld 07/28/2015 105* 65 - 99 mg/dL Final  . BUN 07/28/2015 28* 6 - 20 mg/dL Final  . Creatinine, Ser 07/28/2015 1.11* 0.44 - 1.00 mg/dL Final  . Calcium 07/28/2015 9.4  8.9 - 10.3 mg/dL Final  . Total Protein 07/28/2015 7.5  6.5 - 8.1 g/dL Final  . Albumin 07/28/2015 4.6  3.5 - 5.0 g/dL Final  . AST 07/28/2015 22  15 - 41 U/L Final  . ALT 07/28/2015 12* 14 - 54 U/L Final  . Alkaline Phosphatase 07/28/2015 72  38 - 126 U/L Final  . Total Bilirubin 07/28/2015 0.9  0.3 - 1.2 mg/dL Final  . GFR calc non Af Amer 07/28/2015 45* >60 mL/min Final  . GFR calc Af Amer 07/28/2015 53* >60 mL/min Final   Comment: (NOTE) The eGFR has been calculated using the CKD EPI equation. This calculation has not been validated in all clinical situations. eGFR's persistently <60 mL/min signify possible Chronic Kidney Disease.   . Anion gap 07/28/2015 10  5 - 15 Final       STUDIES: Dg Chest 2 View  07/28/2015  CLINICAL DATA:  History of lung cancer. Prior right lung surgery. Prior history of pneumonia. EXAM: CHEST  2 VIEW COMPARISON:   07/27/2014.  01/25/2014. FINDINGS: Mediastinum and hilar structures are normal. Right lung pleural parenchymal thickening again noted consistent with scarring. Lungs are clear of acute infiltrates. Heart size stable. Prominent sliding hiatal hernia again noted. Prior vertebroplasties. Diffuse thoracic spine osteopenia and degenerative change. IMPRESSION: 1. Right base pleural parenchymal scarring consistent patient's prior surgery. No interim change. No acute cardiopulmonary disease. 2. Large sliding hiatal hernia again noted. Electronically Signed   By: Marcello Moores  Register   On: 07/28/2015 09:34    ASSESSMENT: 1.  Carcinoma of right middle lobe lung.  Adenocarcinoma status post resection and chemotherapy.  On clinical ground there is no evidence of recurrent disease chest x-ray has been reviewed all lab data has been reviewed.  Chest x-ray has been reviewed with the patient.  Dry hacking cough Bronchospasm patient was advised to use Advair as well as DuoNeb inhaler. Continue Robitussin and Tessalon Perles    Patient expressed understanding and was in agreement with this plan. She also understands that She can call clinic at any time with any questions, concerns, or complaints.    No matching staging information was found for the patient.  Forest Gleason, MD   07/31/2015 7:59 PM

## 2015-09-30 ENCOUNTER — Other Ambulatory Visit: Payer: Self-pay | Admitting: *Deleted

## 2015-09-30 DIAGNOSIS — C3490 Malignant neoplasm of unspecified part of unspecified bronchus or lung: Secondary | ICD-10-CM

## 2016-01-19 ENCOUNTER — Inpatient Hospital Stay (HOSPITAL_BASED_OUTPATIENT_CLINIC_OR_DEPARTMENT_OTHER): Payer: Medicare Other | Admitting: Family Medicine

## 2016-01-19 ENCOUNTER — Inpatient Hospital Stay: Payer: Medicare Other | Attending: Family Medicine

## 2016-01-19 ENCOUNTER — Ambulatory Visit: Payer: Medicare Other | Admitting: Oncology

## 2016-01-19 ENCOUNTER — Ambulatory Visit
Admission: RE | Admit: 2016-01-19 | Discharge: 2016-01-19 | Disposition: A | Discharge status: Home / Self Care | Payer: Medicare Other | Source: Ambulatory Visit | Attending: Family Medicine | Admitting: Family Medicine

## 2016-01-19 ENCOUNTER — Encounter: Payer: Self-pay | Admitting: Family Medicine

## 2016-01-19 ENCOUNTER — Other Ambulatory Visit: Payer: Medicare Other

## 2016-01-19 VITALS — BP 153/80 | HR 70 | Temp 97.2°F | Resp 18 | Ht 65.0 in | Wt 128.1 lb

## 2016-01-19 DIAGNOSIS — R05 Cough: Secondary | ICD-10-CM

## 2016-01-19 DIAGNOSIS — Z9221 Personal history of antineoplastic chemotherapy: Secondary | ICD-10-CM | POA: Insufficient documentation

## 2016-01-19 DIAGNOSIS — R918 Other nonspecific abnormal finding of lung field: Secondary | ICD-10-CM | POA: Insufficient documentation

## 2016-01-19 DIAGNOSIS — C3490 Malignant neoplasm of unspecified part of unspecified bronchus or lung: Secondary | ICD-10-CM | POA: Insufficient documentation

## 2016-01-19 DIAGNOSIS — Z902 Acquired absence of lung [part of]: Secondary | ICD-10-CM | POA: Diagnosis not present

## 2016-01-19 DIAGNOSIS — R059 Cough, unspecified: Secondary | ICD-10-CM | POA: Insufficient documentation

## 2016-01-19 DIAGNOSIS — C3431 Malignant neoplasm of lower lobe, right bronchus or lung: Secondary | ICD-10-CM

## 2016-01-19 DIAGNOSIS — K449 Diaphragmatic hernia without obstruction or gangrene: Secondary | ICD-10-CM | POA: Diagnosis not present

## 2016-01-19 DIAGNOSIS — M419 Scoliosis, unspecified: Secondary | ICD-10-CM | POA: Insufficient documentation

## 2016-01-19 DIAGNOSIS — I10 Essential (primary) hypertension: Secondary | ICD-10-CM | POA: Insufficient documentation

## 2016-01-19 DIAGNOSIS — Z79899 Other long term (current) drug therapy: Secondary | ICD-10-CM | POA: Insufficient documentation

## 2016-01-19 DIAGNOSIS — Z87891 Personal history of nicotine dependence: Secondary | ICD-10-CM | POA: Diagnosis not present

## 2016-01-19 HISTORY — DX: Malignant neoplasm of unspecified part of unspecified bronchus or lung: C34.90

## 2016-01-19 HISTORY — DX: Cough, unspecified: R05.9

## 2016-01-19 LAB — CBC WITH DIFFERENTIAL/PLATELET
Basophils Absolute: 0.1 10*3/uL (ref 0–0.1)
Basophils Relative: 1 %
Eosinophils Absolute: 0.1 10*3/uL (ref 0–0.7)
Eosinophils Relative: 2 %
HEMATOCRIT: 39.1 % (ref 35.0–47.0)
Hemoglobin: 13.7 g/dL (ref 12.0–16.0)
LYMPHS PCT: 20 %
Lymphs Abs: 1.3 10*3/uL (ref 1.0–3.6)
MCH: 30.5 pg (ref 26.0–34.0)
MCHC: 35.1 g/dL (ref 32.0–36.0)
MCV: 86.7 fL (ref 80.0–100.0)
MONO ABS: 0.6 10*3/uL (ref 0.2–0.9)
MONOS PCT: 10 %
NEUTROS ABS: 4.3 10*3/uL (ref 1.4–6.5)
Neutrophils Relative %: 67 %
Platelets: 237 10*3/uL (ref 150–440)
RBC: 4.51 MIL/uL (ref 3.80–5.20)
RDW: 14.5 % (ref 11.5–14.5)
WBC: 6.4 10*3/uL (ref 3.6–11.0)

## 2016-01-19 LAB — COMPREHENSIVE METABOLIC PANEL
ALT: 11 U/L — ABNORMAL LOW (ref 14–54)
ANION GAP: 8 (ref 5–15)
AST: 18 U/L (ref 15–41)
Albumin: 4.3 g/dL (ref 3.5–5.0)
Alkaline Phosphatase: 70 U/L (ref 38–126)
BILIRUBIN TOTAL: 1 mg/dL (ref 0.3–1.2)
BUN: 34 mg/dL — AB (ref 6–20)
CO2: 26 mmol/L (ref 22–32)
Calcium: 9.5 mg/dL (ref 8.9–10.3)
Chloride: 102 mmol/L (ref 101–111)
Creatinine, Ser: 1.09 mg/dL — ABNORMAL HIGH (ref 0.44–1.00)
GFR calc Af Amer: 53 mL/min — ABNORMAL LOW (ref >60)
GFR, EST NON AFRICAN AMERICAN: 46 mL/min — AB (ref >60)
Glucose, Bld: 98 mg/dL (ref 65–99)
POTASSIUM: 3.7 mmol/L (ref 3.5–5.1)
Sodium: 136 mmol/L (ref 135–145)
TOTAL PROTEIN: 7.3 g/dL (ref 6.5–8.1)

## 2016-01-19 MED ORDER — PREDNISONE 10 MG PO TABS: 60.0000 mg | ORAL_TABLET | Freq: Every day | ORAL | Status: DC

## 2016-01-19 MED ORDER — LEVOFLOXACIN 500 MG PO TABS
500.0000 mg | ORAL_TABLET | Freq: Every day | ORAL | Status: DC
Start: 2016-01-19 — End: 2016-07-20

## 2016-01-19 NOTE — Progress Notes (Signed)
[facility]  [phone]  [phone]     Stephanie Collins DOB: 03/27/1932  MR#: [id number]  AOZ#:[id number]  Patient Care Team: Leonel Ramsay, MD as PCP - General (Infectious Diseases)  CHIEF COMPLAINT:  Chief Complaint  Patient presents with  . Lung Cancer    INTERVAL HISTORY:  Patient is here for further follow-up regarding carcinoma of the lung. Patient was originally diagnosed in March 2009 and is status post right lower lobectomy as well as 4 cycles of cisplatin and Alimta. She overall reports feeling very well but continues to have persistent cough with occasional yellow or green sputum production. She's been taking Delsym OTC. She denies any hemoptysis, chest pain. She occasionally has shortness of breath. Otherwise she reports feeling very well and denies any other acute complaints.  REVIEW OF SYSTEMS:   Review of Systems  Constitutional: Negative for fever, chills, weight loss, malaise/fatigue and diaphoresis.  HENT: Positive for congestion.   Eyes: Negative.   Respiratory: Positive for cough, sputum production and shortness of breath. Negative for hemoptysis and wheezing.   Cardiovascular: Negative for chest pain, palpitations, orthopnea, claudication, leg swelling and PND.  Gastrointestinal: Negative for heartburn, nausea, vomiting, abdominal pain, diarrhea, constipation, blood in stool and melena.  Genitourinary: Negative.   Musculoskeletal: Negative.   Skin: Negative.   Neurological: Negative for dizziness, tingling, focal weakness, seizures and weakness.  Endo/Heme/Allergies: Does not bruise/bleed easily.  Psychiatric/Behavioral: Negative for depression. The patient is not nervous/anxious and does not have insomnia.     As per HPI. Otherwise, a complete review of systems is negatve.  ONCOLOGY HISTORY: Oncology History   1. CT scan of the chest revealed 3.7 cm soft tissue mass involving medial right lower lobeneedle biopsies  positive for adenocarcinomadiagnosis in March of 2009. Clinically, stage Ib 2. Had, abnormal uptake with SUV of 9 in the left parotid gland biopsy of, which was negative 3. April, 2009, Status post lobectomy pathological staging T3, N0, M0, stage IIB 4. Finished adjuvant therapy 4 cycles with cisplatin And Alimta     Lung cancer ([place])   01/19/2016 Initial Diagnosis Lung cancer ([place])    PAST MEDICAL HISTORY: Past Medical History  Diagnosis Date  . Cancer ([place]) 2010    lung  . Hypertension   . Cough 01/19/2016  . Lung cancer ([place]) 01/19/2016    PAST SURGICAL HISTORY: Past Surgical History  Procedure Laterality Date  . Kyphoplasty N/A 02/01/2015    Procedure: KYPHOPLASTY;  Surgeon: Hessie Knows, MD;  Location: [facility] ORS;  Service: Orthopedics;  Laterality: N/A;  T10    FAMILY HISTORY History reviewed. No pertinent family history.  GYNECOLOGIC HISTORY:  No LMP recorded. Patient has had a hysterectomy.     ADVANCED DIRECTIVES:    HEALTH MAINTENANCE: Social History  Substance Use Topics  . Smoking status: Former Research scientist (life sciences)  . Smokeless tobacco: None  . Alcohol Use: No     Allergies  Allergen Reactions  . Tramadol     confusion    Current Outpatient Prescriptions  Medication Sig Dispense Refill  . albuterol-ipratropium (COMBIVENT) 18-103 MCG/ACT inhaler Inhale 1 puff into the lungs 4 (four) times daily. 1 Inhaler 1  . benzonatate (TESSALON) 200 MG capsule Take by mouth.    . dextromethorphan (DELSYM) 30 MG/5ML liquid Take 15 mg by mouth 3 (three) times daily as needed for cough.    . Fluticasone-Salmeterol (ADVAIR) 250-50 MCG/DOSE AEPB Inhale 1 puff into the lungs 2 (two) times daily.    Marland Kitchen  triamterene-hydrochlorothiazide (MAXZIDE-25) 37.5-25 MG per tablet Take 1 tablet by mouth daily.    Marland Kitchen levofloxacin (LEVAQUIN) 500 MG tablet Take 1 tablet (500 mg total) by mouth daily. 7 tablet 0  . predniSONE (DELTASONE) 10 MG tablet Take 6 tablets (60 mg total) by mouth daily with  breakfast. Taper by '10mg'$  daily until complete 21 tablet 0   No current facility-administered medications for this visit.    OBJECTIVE: BP 153/80 mmHg  Pulse 70  Temp(Src) 97.2 F (36.2 C) (Tympanic)  Resp 18  Ht '5\' 5"'$  (1.651 m)  Wt 128 lb 1.4 oz (58.1 kg)  BMI 21.31 kg/m2   Body mass index is 21.31 kg/(m^2).    ECOG FS:1 - Symptomatic but completely ambulatory  General: Well-developed, well-nourished, no acute distress. Eyes: Pink conjunctiva, anicteric sclera. HEENT: Normocephalic, moist mucous membranes, clear oropharnyx. Lungs: Scattered bilateral rhonchi. Heart: Regular rate and rhythm. No rubs, murmurs, or gallops. Abdomen: Soft, nontender, nondistended. No organomegaly noted, normoactive bowel sounds. Musculoskeletal: No edema, cyanosis, or clubbing. Scoliosis Neuro: Alert, answering all questions appropriately. Cranial nerves grossly intact. Skin: No rashes or petechiae noted. Psych: Normal affect. Lymphatics: No cervical, clavicular LAD.   LAB RESULTS:  Appointment on 01/19/2016  Component Date Value Ref Range Status  . WBC 01/19/2016 6.4  3.6 - 11.0 K/uL Final  . RBC 01/19/2016 4.51  3.80 - 5.20 MIL/uL Final  . Hemoglobin 01/19/2016 13.7  12.0 - 16.0 g/dL Final  . HCT 01/19/2016 39.1  35.0 - 47.0 % Final  . MCV 01/19/2016 86.7  80.0 - 100.0 fL Final  . MCH 01/19/2016 30.5  26.0 - 34.0 pg Final  . MCHC 01/19/2016 35.1  32.0 - 36.0 g/dL Final  . RDW 01/19/2016 14.5  11.5 - 14.5 % Final  . Platelets 01/19/2016 237  150 - 440 K/uL Final  . Neutrophils Relative % 01/19/2016 67   Final  . Neutro Abs 01/19/2016 4.3  1.4 - 6.5 K/uL Final  . Lymphocytes Relative 01/19/2016 20   Final  . Lymphs Abs 01/19/2016 1.3  1.0 - 3.6 K/uL Final  . Monocytes Relative 01/19/2016 10   Final  . Monocytes Absolute 01/19/2016 0.6  0.2 - 0.9 K/uL Final  . Eosinophils Relative 01/19/2016 2   Final  . Eosinophils Absolute 01/19/2016 0.1  0 - 0.7 K/uL Final  . Basophils Relative  01/19/2016 1   Final  . Basophils Absolute 01/19/2016 0.1  0 - 0.1 K/uL Final  . Sodium 01/19/2016 136  135 - 145 mmol/L Final  . Potassium 01/19/2016 3.7  3.5 - 5.1 mmol/L Final  . Chloride 01/19/2016 102  101 - 111 mmol/L Final  . CO2 01/19/2016 26  22 - 32 mmol/L Final  . Glucose, Bld 01/19/2016 98  65 - 99 mg/dL Final  . BUN 01/19/2016 34* 6 - 20 mg/dL Final  . Creatinine, Ser 01/19/2016 1.09* 0.44 - 1.00 mg/dL Final  . Calcium 01/19/2016 9.5  8.9 - 10.3 mg/dL Final  . Total Protein 01/19/2016 7.3  6.5 - 8.1 g/dL Final  . Albumin 01/19/2016 4.3  3.5 - 5.0 g/dL Final  . AST 01/19/2016 18  15 - 41 U/L Final  . ALT 01/19/2016 11* 14 - 54 U/L Final  . Alkaline Phosphatase 01/19/2016 70  38 - 126 U/L Final  . Total Bilirubin 01/19/2016 1.0  0.3 - 1.2 mg/dL Final  . GFR calc non Af Amer 01/19/2016 46* >60 mL/min Final  . GFR calc Af Amer 01/19/2016 53* >60 mL/min Final  Comment: (NOTE) The eGFR has been calculated using the CKD EPI equation. This calculation has not been validated in all clinical situations. eGFR's persistently <60 mL/min signify possible Chronic Kidney Disease.   . Anion gap 01/19/2016 8  5 - 15 Final    STUDIES: Dg Chest 2 View  01/19/2016  CLINICAL DATA:  History of lung carcinoma with previous right lower lobe resection , now with cough EXAM: CHEST  2 VIEW COMPARISON:  Chest x-ray of 07/28/2015 and CT chest of 07/23/2013 FINDINGS: Chronic blunting of the right costophrenic angle is stable consistent with scarring from prior lobectomy. No active infiltrate or effusion is seen. Mediastinal and hilar contours are unremarkable. The heart is borderline enlarged. A large hiatal hernia is present. The bones are osteopenic and several lower thoracic lumbar vertebral body vertebroplasties are noted. IMPRESSION: 1. Stable scarring at the right lung base. 2. Stable moderate size hiatal hernia. Electronically Signed   By: Ivar Drape M.D.   On: 01/19/2016 15:24    ASSESSMENT:    Adenocarcinoma of the right lower lobe of lung, stage IIB, T3 N0 M0. Diagnosed in 2009.  PLAN:   1. Right lower lobe lung cancer. Patient is status post right lower lobectomy as well as adjuvant chemotherapy with 4 cycles of cisplatin and Alimta. Today she reports having a persistent dry hacking cough that on occasion produces yellow or green sputum but denies hemoptysis. Chest x-ray overall negative for any acute abnormality. Advised patient that we would start antibiotics as well as prednisone taper. She was also encouraged to use her Combivent inhalers prior to using her Advair on a daily basis. Patient is to contact us if the cough has not improved in the next 2-3 weeks. If persistent coughing with sputum continues we may need to get a CT scan of the chest.  We will schedule routine follow-up again in 6 months with a chest x-ray prior to appointment.  Patient expressed understanding and was in agreement with this plan. She also understands that She can call clinic at any time with any questions, concerns, or complaints.   Dr. Oliva Bustard was available for consultation and review of plan of care for this patient.   Evlyn Kanner, NP   01/20/2016 9:04 AM

## 2016-01-19 NOTE — Progress Notes (Signed)
Pt doing good. Coughing and productive sputum sometimes yellow or green and no fever. Breathing ok. Uses MOM to help with bowels. No issues

## 2016-03-28 ENCOUNTER — Other Ambulatory Visit: Payer: Self-pay | Admitting: *Deleted

## 2016-03-28 DIAGNOSIS — C3430 Malignant neoplasm of lower lobe, unspecified bronchus or lung: Secondary | ICD-10-CM

## 2016-05-09 ENCOUNTER — Other Ambulatory Visit: Payer: Self-pay | Admitting: *Deleted

## 2016-05-17 ENCOUNTER — Other Ambulatory Visit: Payer: Self-pay | Admitting: Infectious Diseases

## 2016-05-18 ENCOUNTER — Other Ambulatory Visit: Payer: Self-pay | Admitting: Infectious Diseases

## 2016-05-18 DIAGNOSIS — Z1231 Encounter for screening mammogram for malignant neoplasm of breast: Secondary | ICD-10-CM

## 2016-06-13 ENCOUNTER — Ambulatory Visit
Admission: RE | Admit: 2016-06-13 | Discharge: 2016-06-13 | Disposition: A | Discharge status: Home / Self Care | Payer: Medicare Other | Source: Ambulatory Visit | Attending: Infectious Diseases | Admitting: Infectious Diseases

## 2016-06-13 DIAGNOSIS — Z1231 Encounter for screening mammogram for malignant neoplasm of breast: Secondary | ICD-10-CM

## 2016-07-20 ENCOUNTER — Inpatient Hospital Stay: Payer: Medicare Other | Attending: Internal Medicine | Admitting: Internal Medicine

## 2016-07-20 ENCOUNTER — Inpatient Hospital Stay: Payer: Medicare Other

## 2016-07-20 DIAGNOSIS — I10 Essential (primary) hypertension: Secondary | ICD-10-CM | POA: Insufficient documentation

## 2016-07-20 DIAGNOSIS — Z902 Acquired absence of lung [part of]: Secondary | ICD-10-CM | POA: Insufficient documentation

## 2016-07-20 DIAGNOSIS — M818 Other osteoporosis without current pathological fracture: Secondary | ICD-10-CM | POA: Insufficient documentation

## 2016-07-20 DIAGNOSIS — C3490 Malignant neoplasm of unspecified part of unspecified bronchus or lung: Secondary | ICD-10-CM

## 2016-07-20 DIAGNOSIS — C3431 Malignant neoplasm of lower lobe, right bronchus or lung: Secondary | ICD-10-CM | POA: Diagnosis not present

## 2016-07-20 DIAGNOSIS — Z79899 Other long term (current) drug therapy: Secondary | ICD-10-CM | POA: Insufficient documentation

## 2016-07-20 DIAGNOSIS — Z9221 Personal history of antineoplastic chemotherapy: Secondary | ICD-10-CM | POA: Diagnosis not present

## 2016-07-20 DIAGNOSIS — Z87891 Personal history of nicotine dependence: Secondary | ICD-10-CM | POA: Diagnosis not present

## 2016-07-20 DIAGNOSIS — R05 Cough: Secondary | ICD-10-CM

## 2016-07-20 DIAGNOSIS — R059 Cough, unspecified: Secondary | ICD-10-CM

## 2016-07-20 DIAGNOSIS — C3430 Malignant neoplasm of lower lobe, unspecified bronchus or lung: Secondary | ICD-10-CM

## 2016-07-20 LAB — COMPREHENSIVE METABOLIC PANEL
ALBUMIN: 4.2 g/dL (ref 3.5–5.0)
ALK PHOS: 59 U/L (ref 38–126)
ALT: 11 U/L — ABNORMAL LOW (ref 14–54)
ANION GAP: 7 (ref 5–15)
AST: 21 U/L (ref 15–41)
BUN: 25 mg/dL — ABNORMAL HIGH (ref 6–20)
CALCIUM: 9.3 mg/dL (ref 8.9–10.3)
CHLORIDE: 98 mmol/L — AB (ref 101–111)
CO2: 27 mmol/L (ref 22–32)
Creatinine, Ser: 1.33 mg/dL — ABNORMAL HIGH (ref 0.44–1.00)
GFR calc Af Amer: 42 mL/min — ABNORMAL LOW (ref >60)
GFR calc non Af Amer: 36 mL/min — ABNORMAL LOW (ref >60)
GLUCOSE: 105 mg/dL — AB (ref 65–99)
POTASSIUM: 3.6 mmol/L (ref 3.5–5.1)
SODIUM: 132 mmol/L — AB (ref 135–145)
Total Bilirubin: 1.1 mg/dL (ref 0.3–1.2)
Total Protein: 7.1 g/dL (ref 6.5–8.1)

## 2016-07-20 LAB — CBC WITH DIFFERENTIAL/PLATELET
Basophils Absolute: 0.1 10*3/uL (ref 0–0.1)
Basophils Relative: 1 %
Eosinophils Absolute: 0.1 10*3/uL (ref 0–0.7)
Eosinophils Relative: 2 %
HEMATOCRIT: 41.8 % (ref 35.0–47.0)
Hemoglobin: 14.4 g/dL (ref 12.0–16.0)
LYMPHS ABS: 1.3 10*3/uL (ref 1.0–3.6)
LYMPHS PCT: 23 %
MCH: 30 pg (ref 26.0–34.0)
MCHC: 34.4 g/dL (ref 32.0–36.0)
MCV: 87.2 fL (ref 80.0–100.0)
MONO ABS: 0.4 10*3/uL (ref 0.2–0.9)
MONOS PCT: 8 %
NEUTROS ABS: 3.6 10*3/uL (ref 1.4–6.5)
Neutrophils Relative %: 66 %
Platelets: 214 10*3/uL (ref 150–440)
RBC: 4.8 MIL/uL (ref 3.80–5.20)
RDW: 14 % (ref 11.5–14.5)
WBC: 5.4 10*3/uL (ref 3.6–11.0)

## 2016-07-20 NOTE — Progress Notes (Signed)
Pt here for follow up. With no voiced concerns

## 2016-07-20 NOTE — Assessment & Plan Note (Addendum)
#   STAGE II LUNG CANCER s/p surgery followed by adjuvant chemotherapy. Clinically no evidence of recurrence. I would not recommend further imaging; last screening CT scan 2016 NED.  # OSTEOPOROSIS- s/p Reclast   # no follow up at this time unless any other issues or concerns arise. discussed with the patient and daughter. They agree with the plan.   CC: Dr.Fiztgerald.

## 2016-07-20 NOTE — Progress Notes (Signed)
[facility] OFFICE PROGRESS NOTE  Patient Care Team: Leonel Ramsay, MD as PCP - General (Infectious Diseases)  No matching staging information was found for the patient.   Oncology History   1. CT scan of the chest revealed 3.7 cm soft tissue mass involving medial right lower lobeneedle biopsies positive for adenocarcinomadiagnosis in March of 2009. Clinically, stage Ib 2. Had, abnormal uptake with SUV of 9 in the left parotid gland biopsy of, which was negative 3. April, 2009, Status post lobectomy pathological staging T3, N0, M0, stage IIB 4. Finished adjuvant therapy 4 cycles with cisplatin And Alimta     Malignant neoplasm of lower lobe, right bronchus or lung (CODE) ([place])   01/19/2016 Initial Diagnosis    Lung cancer ([place])        This is my first interaction with the patient as patient's primary oncologist has been Dr.Choksi. I reviewed the patient's prior charts/pertinent labs/imaging in detail; findings are summarized above.     INTERVAL HISTORY:  Stephanie Collins 82 y.o.  female pleasant patient above history of Lung cancer status post surgery followed by adjuvant chemotherapy is here for follow-up. Patient denies any shortness of breath. Denies any chest pain or cough. No bone pain. No headaches. Patient continues to be very active for her age.  REVIEW OF SYSTEMS:  A complete 10 point review of system is done which is negative except mentioned above/history of present illness.   PAST MEDICAL HISTORY :  Past Medical History:  Diagnosis Date  . Cancer ([place]) 2010   lung  . Cough 01/19/2016  . Hypertension   . Lung cancer ([place]) 01/19/2016    PAST SURGICAL HISTORY :   Past Surgical History:  Procedure Laterality Date  . KYPHOPLASTY N/A 02/01/2015   Procedure: KYPHOPLASTY;  Surgeon: Hessie Knows, MD;  Location: [facility] ORS;  Service: Orthopedics;  Laterality: N/A;  T10    FAMILY HISTORY :   Family History  Problem Relation Age of Onset  . Breast cancer Neg  Hx     SOCIAL HISTORY:   Social History  Substance Use Topics  . Smoking status: Former Research scientist (life sciences)  . Smokeless tobacco: Not on file  . Alcohol use No    ALLERGIES:  is allergic to tramadol.  MEDICATIONS:  Current Outpatient Prescriptions  Medication Sig Dispense Refill  . albuterol-ipratropium (COMBIVENT) 18-103 MCG/ACT inhaler Inhale 1 puff into the lungs 4 (four) times daily. 1 Inhaler 1  . Fluticasone-Salmeterol (ADVAIR) 250-50 MCG/DOSE AEPB Inhale 1 puff into the lungs 2 (two) times daily.    Marland Kitchen triamterene-hydrochlorothiazide (MAXZIDE-25) 37.5-25 MG per tablet Take 1 tablet by mouth daily.    Marland Kitchen dextromethorphan (DELSYM) 30 MG/5ML liquid Take 15 mg by mouth 3 (three) times daily as needed for cough.     No current facility-administered medications for this visit.     PHYSICAL EXAMINATION: ECOG PERFORMANCE STATUS: 0 - Asymptomatic  BP 122/73 (BP Location: Right Arm, Patient Position: Sitting)   Pulse 72   Temp (!) 96.2 F (35.7 C)   Resp 18   Wt 128 lb 6.4 oz (58.2 kg)   BMI 21.37 kg/m   Filed Weights   07/20/16 1116  Weight: 128 lb 6.4 oz (58.2 kg)    GENERAL: Well-nourished well-developed; Alert, no distress and comfortable.   Accompanied by daughter.  EYES: no pallor or icterus OROPHARYNX: no thrush or ulceration; good dentition  NECK: supple, no masses felt LYMPH:  no palpable lymphadenopathy in the cervical, axillary or inguinal  regions LUNGS: clear to auscultation and  No wheeze or crackles HEART/CVS: regular rate & rhythm and no murmurs; No lower extremity edema ABDOMEN:abdomen soft, non-tender and normal bowel sounds Musculoskeletal:no cyanosis of digits and no clubbing  PSYCH: alert & oriented x 3 with fluent speech NEURO: no focal motor/sensory deficits SKIN:  no rashes or significant lesions  LABORATORY DATA:  I have reviewed the data as listed    Component Value Date/Time   NA 132 (L) 07/20/2016 1030   NA 138 07/27/2014 1028   K 3.6 07/20/2016  1030   K 3.8 07/27/2014 1028   CL 98 (L) 07/20/2016 1030   CL 101 07/27/2014 1028   CO2 27 07/20/2016 1030   CO2 31 07/27/2014 1028   GLUCOSE 105 (H) 07/20/2016 1030   GLUCOSE 83 07/27/2014 1028   BUN 25 (H) 07/20/2016 1030   BUN 29 (H) 07/27/2014 1028   CREATININE 1.33 (H) 07/20/2016 1030   CREATININE 1.43 (H) 07/27/2014 1028   CALCIUM 9.3 07/20/2016 1030   CALCIUM 10.2 (H) 07/27/2014 1028   PROT 7.1 07/20/2016 1030   PROT 7.3 07/27/2014 1028   ALBUMIN 4.2 07/20/2016 1030   ALBUMIN 3.8 07/27/2014 1028   AST 21 07/20/2016 1030   AST 19 07/27/2014 1028   ALT 11 (L) 07/20/2016 1030   ALT 20 07/27/2014 1028   ALKPHOS 59 07/20/2016 1030   ALKPHOS 87 07/27/2014 1028   BILITOT 1.1 07/20/2016 1030   BILITOT 0.7 07/27/2014 1028   GFRNONAA 36 (L) 07/20/2016 1030   GFRNONAA 38 (L) 07/27/2014 1028   GFRNONAA 45 (L) 01/25/2014 1003   GFRAA 42 (L) 07/20/2016 1030   GFRAA 45 (L) 07/27/2014 1028   GFRAA 52 (L) 01/25/2014 1003    No results found for: SPEP, UPEP  Lab Results  Component Value Date   WBC 5.4 07/20/2016   NEUTROABS 3.6 07/20/2016   HGB 14.4 07/20/2016   HCT 41.8 07/20/2016   MCV 87.2 07/20/2016   PLT 214 07/20/2016      Chemistry      Component Value Date/Time   NA 132 (L) 07/20/2016 1030   NA 138 07/27/2014 1028   K 3.6 07/20/2016 1030   K 3.8 07/27/2014 1028   CL 98 (L) 07/20/2016 1030   CL 101 07/27/2014 1028   CO2 27 07/20/2016 1030   CO2 31 07/27/2014 1028   BUN 25 (H) 07/20/2016 1030   BUN 29 (H) 07/27/2014 1028   CREATININE 1.33 (H) 07/20/2016 1030   CREATININE 1.43 (H) 07/27/2014 1028      Component Value Date/Time   CALCIUM 9.3 07/20/2016 1030   CALCIUM 10.2 (H) 07/27/2014 1028   ALKPHOS 59 07/20/2016 1030   ALKPHOS 87 07/27/2014 1028   AST 21 07/20/2016 1030   AST 19 07/27/2014 1028   ALT 11 (L) 07/20/2016 1030   ALT 20 07/27/2014 1028   BILITOT 1.1 07/20/2016 1030   BILITOT 0.7 07/27/2014 1028       RADIOGRAPHIC STUDIES: I have  personally reviewed the radiological images as listed and agreed with the findings in the report. No results found.   ASSESSMENT & PLAN:  Malignant neoplasm of lower lobe, right bronchus or lung (CODE) ([place]) # STAGE II LUNG CANCER s/p surgery followed by adjuvant chemotherapy. Clinically no evidence of recurrence. I would not recommend further imaging; last screening CT scan 2016 NED.  # OSTEOPOROSIS- s/p Reclast   # no follow up at this time unless any other issues or concerns arise. discussed  with the patient and daughter. They agree with the plan.   CC: Dr.Fiztgerald.    No orders of the defined types were placed in this encounter.  All questions were answered. The patient knows to call the clinic with any problems, questions or concerns.      Cammie Sickle, MD 07/21/2016 11:50 AM

## 2016-07-23 IMAGING — XA DG THORACIC SPINE 1V
1 series · 1 of 1 positions shown · non-contrast
Comparison: MRI thoracic spine 01/29/2015

FLUOROSCOPY TIME:  0 minutes 41 seconds

CLINICAL DATA: T10 kyphoplasty for compression fracture

EXAM:
OPERATIVE THORACIC SPINE 2 VIEW(S) ; FLUOROSCOPY 61-121 MINUTES

[Series 1: cont. · 1 of 1 slices shown]
[im 1/1]
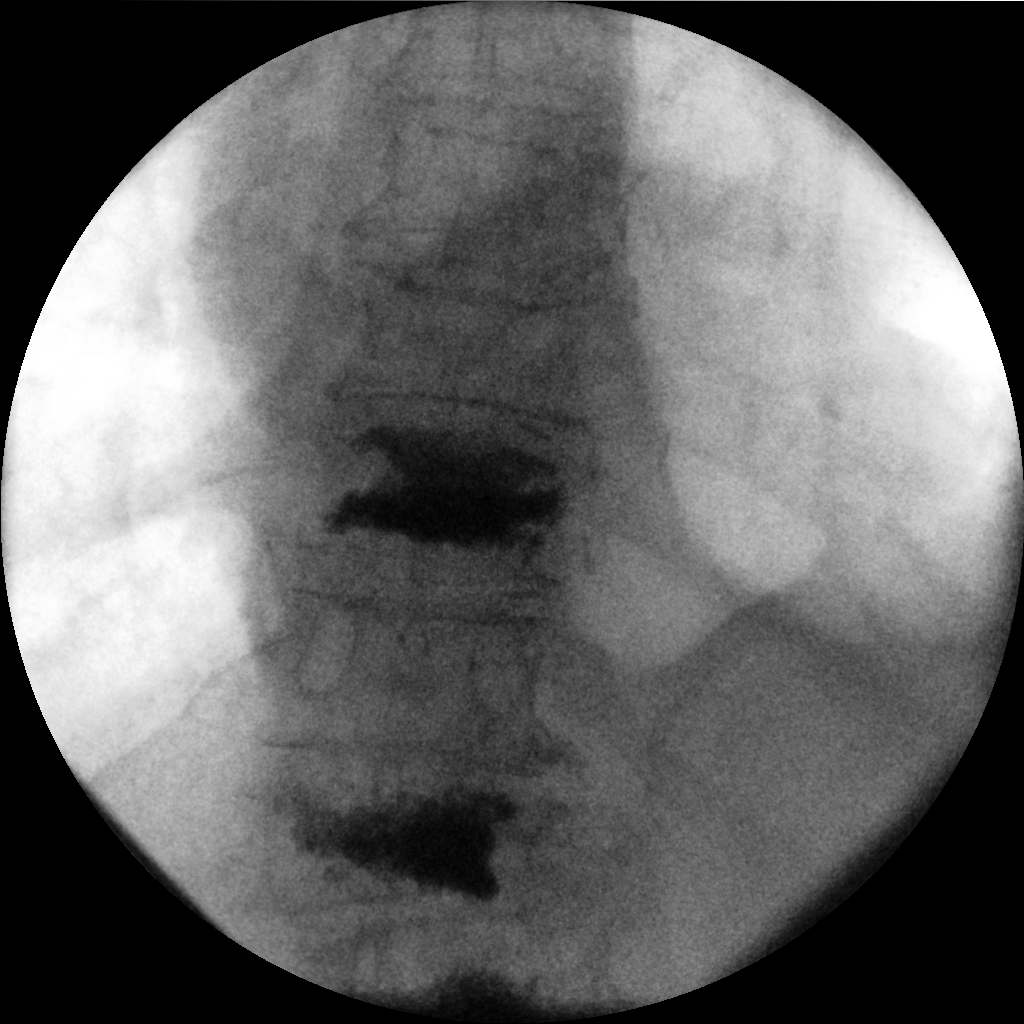

[1 of 1 positions shown; findings below may reference images not displayed]

FINDINGS: Prior spinal augmentation procedure at T12.

New cement identified at T10 compression fracture.

Bones severely demineralized limiting assessment.
IMPRESSION: Old T12 and interval T10 spinal augmentation procedures.

## 2017-01-16 IMAGING — CR DG CHEST 2V
2 series · 2 of 2 positions shown · non-contrast
Comparison: 07/27/2014.  01/25/2014.

CLINICAL DATA: History of lung cancer. Prior right lung surgery.
Prior history of pneumonia.

EXAM:
CHEST  2 VIEW

[chest pa]
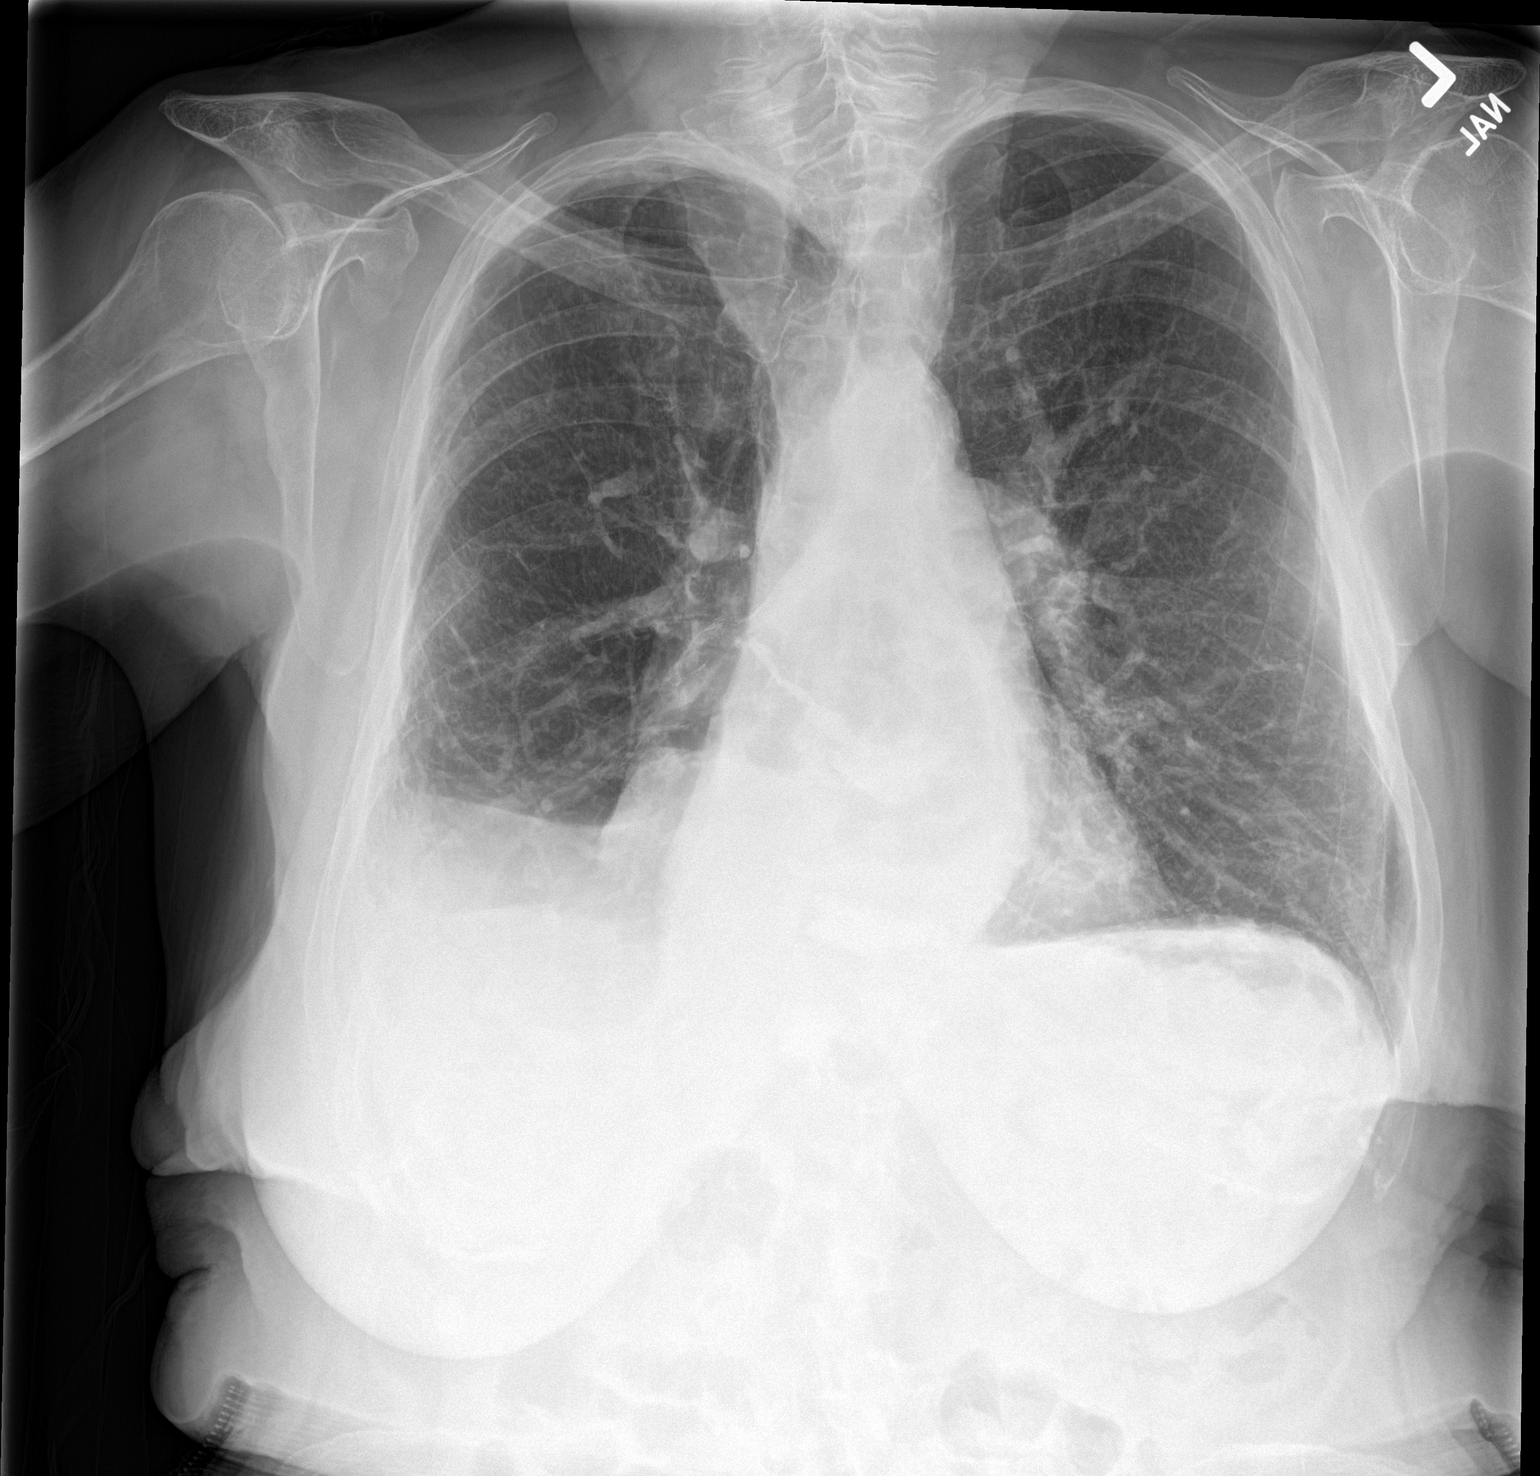

[chest lat]
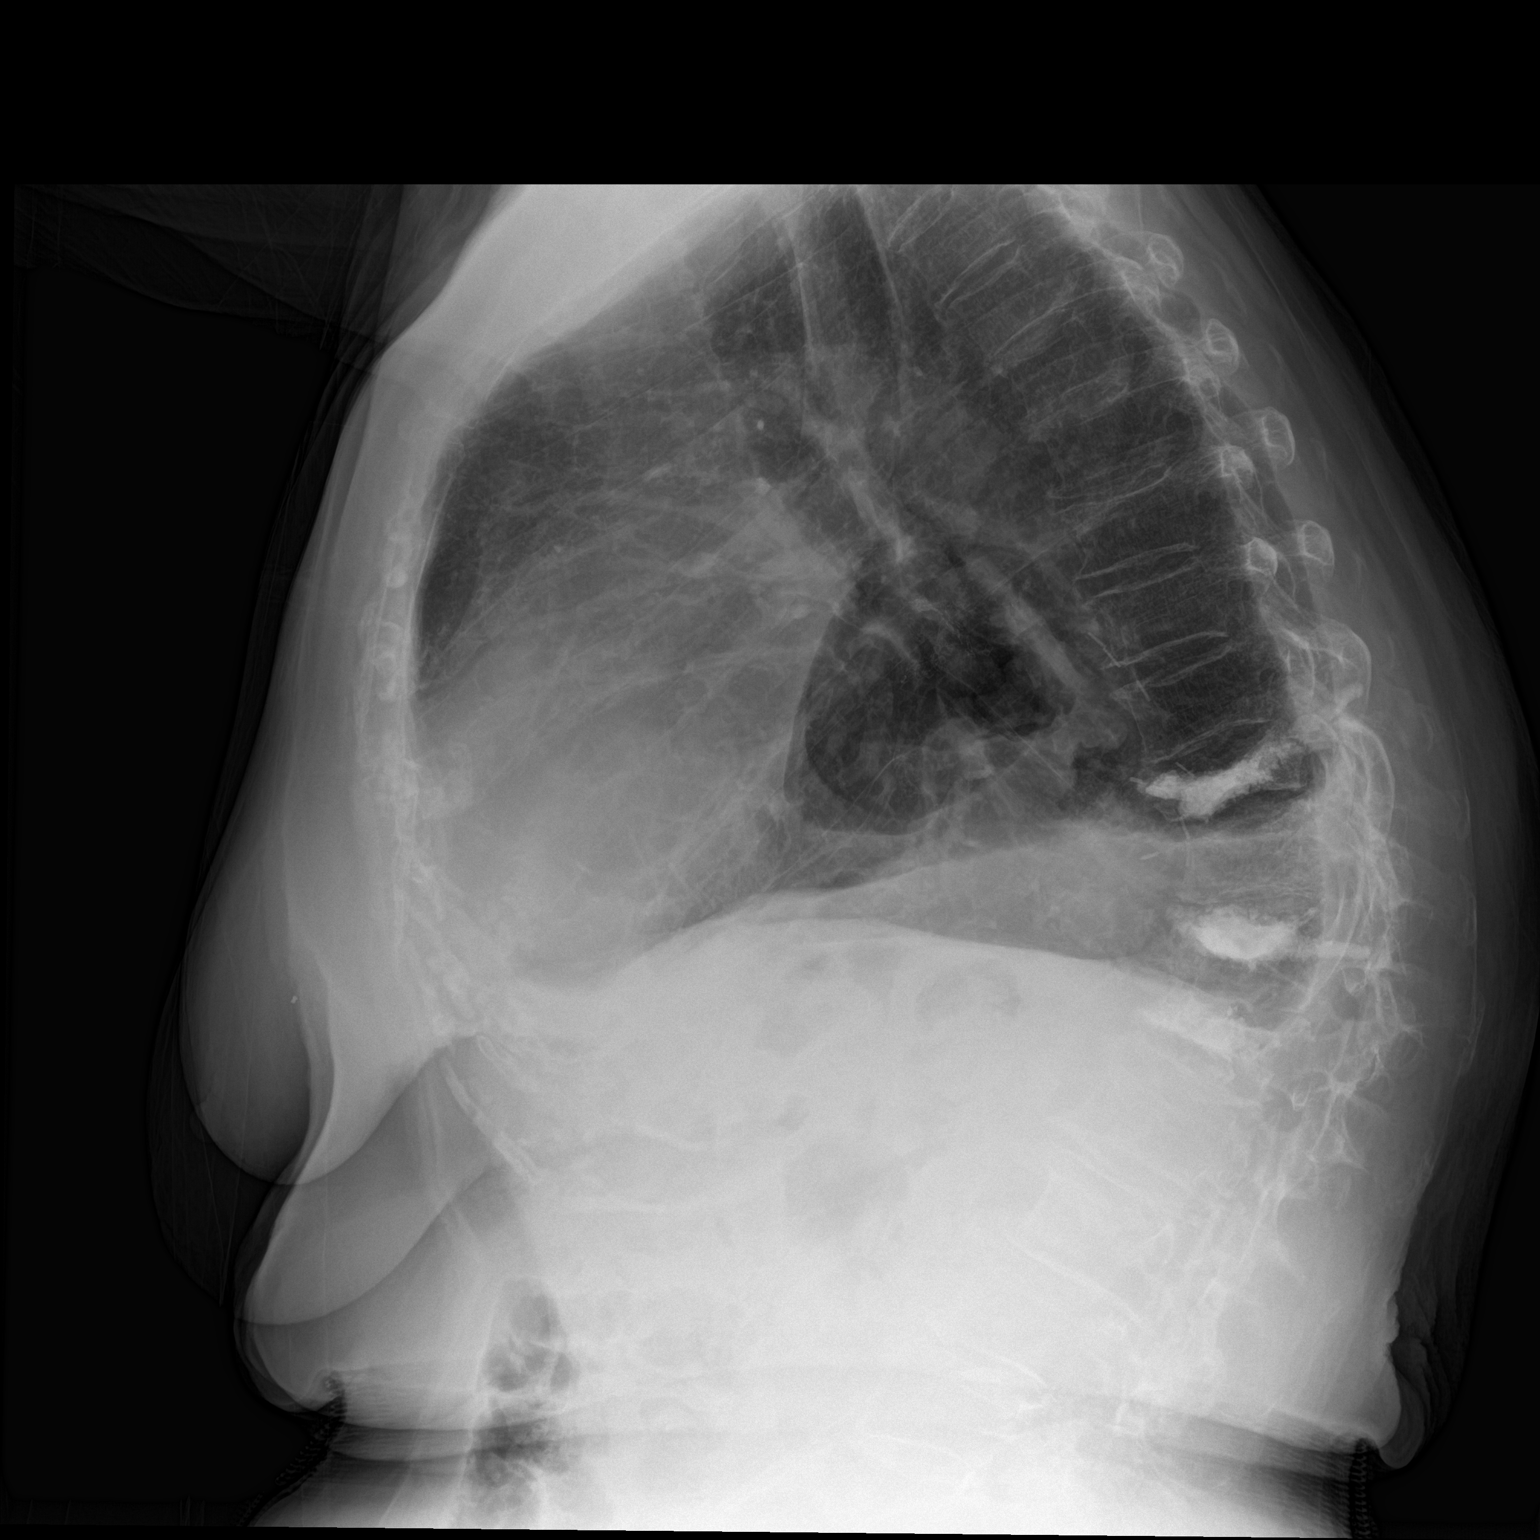

[2 of 2 positions shown; findings below may reference images not displayed]

FINDINGS: Mediastinum and hilar structures are normal. Right lung pleural
parenchymal thickening again noted consistent with scarring. Lungs
are clear of acute infiltrates. Heart size stable. Prominent sliding
hiatal hernia again noted. Prior vertebroplasties. Diffuse thoracic
spine osteopenia and degenerative change.
IMPRESSION: 1. Right base pleural parenchymal scarring consistent patient's
prior surgery. No interim change. No acute cardiopulmonary disease.
2. Large sliding hiatal hernia again noted.

## 2017-05-07 ENCOUNTER — Other Ambulatory Visit: Payer: Self-pay | Admitting: Infectious Diseases

## 2017-05-27 ENCOUNTER — Other Ambulatory Visit: Payer: Self-pay | Admitting: Infectious Diseases

## 2017-05-27 DIAGNOSIS — Z1231 Encounter for screening mammogram for malignant neoplasm of breast: Secondary | ICD-10-CM

## 2017-06-14 ENCOUNTER — Ambulatory Visit
Admission: RE | Admit: 2017-06-14 | Discharge: 2017-06-14 | Disposition: A | Discharge status: Home / Self Care | Payer: Medicare Other | Source: Ambulatory Visit | Attending: Infectious Diseases | Admitting: Infectious Diseases

## 2017-06-14 DIAGNOSIS — Z1231 Encounter for screening mammogram for malignant neoplasm of breast: Secondary | ICD-10-CM | POA: Diagnosis not present

## 2017-07-10 IMAGING — CR DG CHEST 2V
2 series · 2 of 2 positions shown · non-contrast
Comparison: Chest x-ray of 07/28/2015 and CT chest of 07/23/2013

CLINICAL DATA: History of lung carcinoma with previous right lower
lobe resection , now with cough

EXAM:
CHEST  2 VIEW

[chest pa]
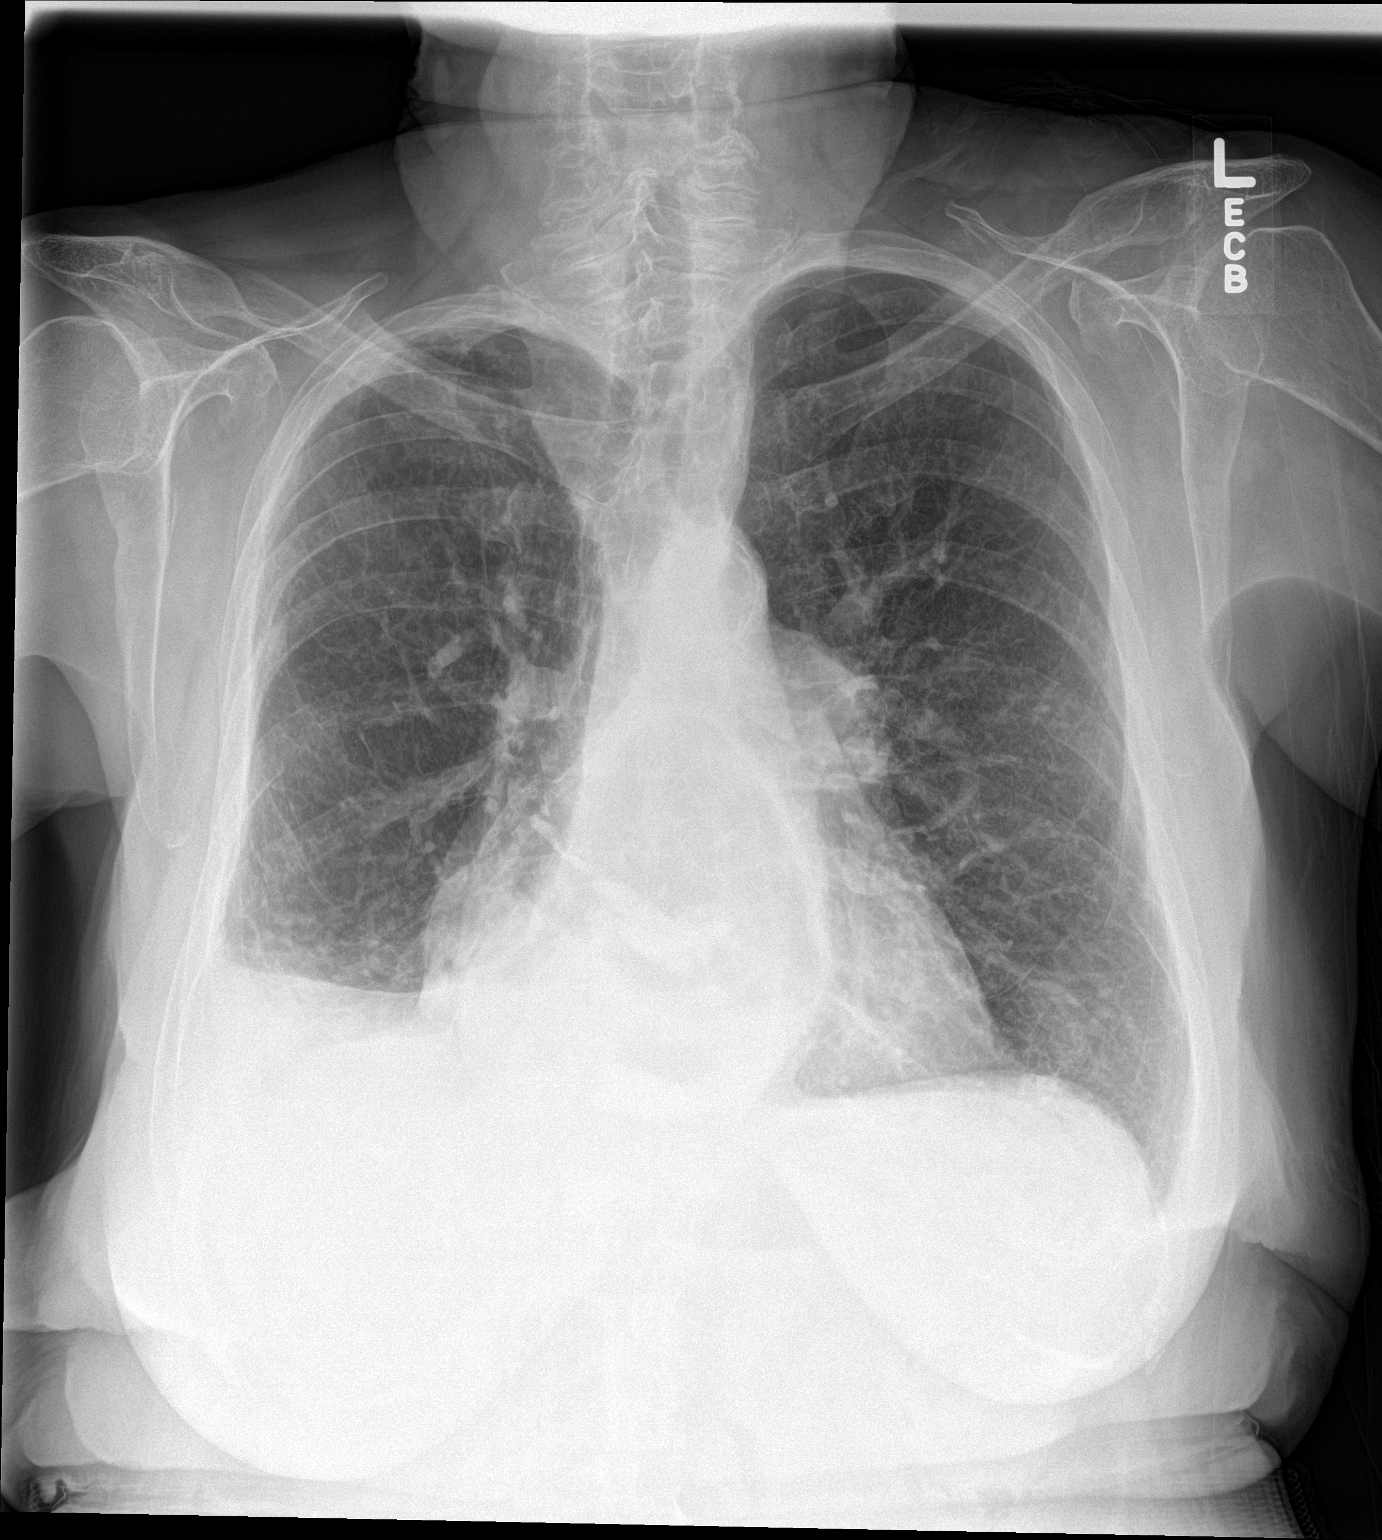

[chest lat]
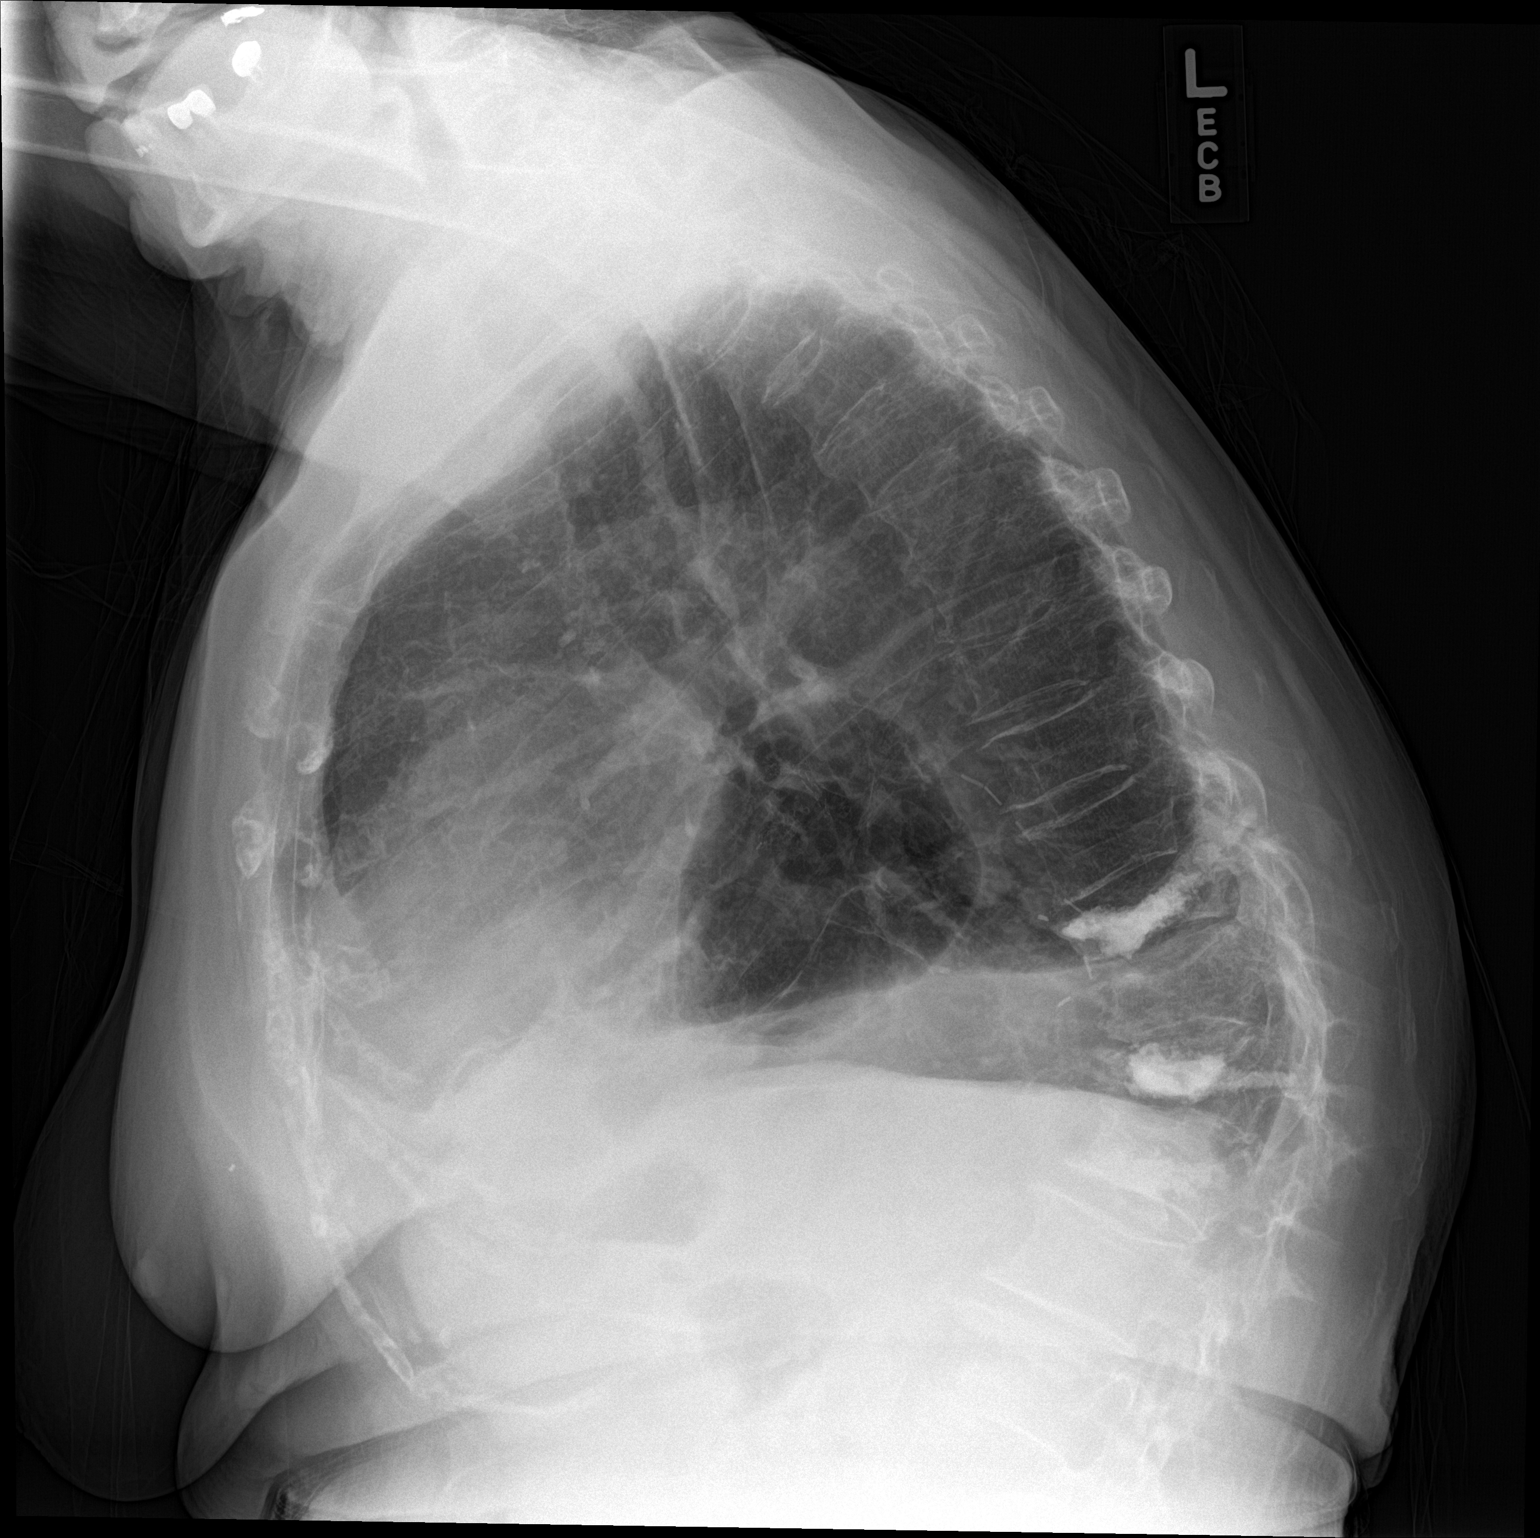

[2 of 2 positions shown; findings below may reference images not displayed]

FINDINGS: Chronic blunting of the right costophrenic angle is stable
consistent with scarring from prior lobectomy. No active infiltrate
or effusion is seen. Mediastinal and hilar contours are
unremarkable. The heart is borderline enlarged. A large hiatal
hernia is present. The bones are osteopenic and several lower
thoracic lumbar vertebral body vertebroplasties are noted.
IMPRESSION: 1. Stable scarring at the right lung base.
2. Stable moderate size hiatal hernia.

## 2018-08-05 ENCOUNTER — Other Ambulatory Visit: Payer: Self-pay | Admitting: Internal Medicine

## 2018-08-05 DIAGNOSIS — Z1231 Encounter for screening mammogram for malignant neoplasm of breast: Secondary | ICD-10-CM

## 2018-08-06 ENCOUNTER — Ambulatory Visit
Admission: RE | Admit: 2018-08-06 | Discharge: 2018-08-06 | Disposition: A | Discharge status: Home / Self Care | Payer: Medicare Other | Source: Ambulatory Visit | Attending: Internal Medicine | Admitting: Internal Medicine

## 2018-08-06 DIAGNOSIS — Z1231 Encounter for screening mammogram for malignant neoplasm of breast: Secondary | ICD-10-CM | POA: Diagnosis present

## 2019-10-12 ENCOUNTER — Other Ambulatory Visit: Payer: Self-pay | Admitting: Internal Medicine

## 2019-10-12 DIAGNOSIS — Z1231 Encounter for screening mammogram for malignant neoplasm of breast: Secondary | ICD-10-CM

## 2019-12-15 ENCOUNTER — Ambulatory Visit
Admission: RE | Admit: 2019-12-15 | Discharge: 2019-12-15 | Disposition: A | Discharge status: Home / Self Care | Payer: Medicare Other | Source: Ambulatory Visit | Attending: Internal Medicine | Admitting: Internal Medicine

## 2019-12-15 DIAGNOSIS — Z1231 Encounter for screening mammogram for malignant neoplasm of breast: Secondary | ICD-10-CM | POA: Diagnosis present

## 2020-03-22 ENCOUNTER — Inpatient Hospital Stay
Admission: EM | Admit: 2020-03-22 | Discharge: 2020-03-25 | DRG: 291 | Disposition: A | Discharge status: Home / Self Care | Payer: Medicare Other | Attending: Internal Medicine | Admitting: Internal Medicine

## 2020-03-22 ENCOUNTER — Emergency Department: Payer: Medicare Other

## 2020-03-22 ENCOUNTER — Inpatient Hospital Stay
Admit: 2020-03-22 | Discharge: 2020-03-22 | Disposition: A | Discharge status: Home / Self Care | Payer: Medicare Other | Attending: Internal Medicine | Admitting: Internal Medicine

## 2020-03-22 ENCOUNTER — Other Ambulatory Visit: Payer: Self-pay

## 2020-03-22 ENCOUNTER — Inpatient Hospital Stay: Payer: Medicare Other

## 2020-03-22 ENCOUNTER — Encounter: Payer: Self-pay | Admitting: Emergency Medicine

## 2020-03-22 DIAGNOSIS — J9621 Acute and chronic respiratory failure with hypoxia: Secondary | ICD-10-CM

## 2020-03-22 DIAGNOSIS — Z85118 Personal history of other malignant neoplasm of bronchus and lung: Secondary | ICD-10-CM | POA: Diagnosis not present

## 2020-03-22 DIAGNOSIS — J441 Chronic obstructive pulmonary disease with (acute) exacerbation: Secondary | ICD-10-CM | POA: Diagnosis present

## 2020-03-22 DIAGNOSIS — I509 Heart failure, unspecified: Secondary | ICD-10-CM | POA: Diagnosis not present

## 2020-03-22 DIAGNOSIS — Z885 Allergy status to narcotic agent status: Secondary | ICD-10-CM

## 2020-03-22 DIAGNOSIS — G4700 Insomnia, unspecified: Secondary | ICD-10-CM | POA: Diagnosis present

## 2020-03-22 DIAGNOSIS — E876 Hypokalemia: Secondary | ICD-10-CM | POA: Diagnosis not present

## 2020-03-22 DIAGNOSIS — Z902 Acquired absence of lung [part of]: Secondary | ICD-10-CM

## 2020-03-22 DIAGNOSIS — Z79899 Other long term (current) drug therapy: Secondary | ICD-10-CM | POA: Diagnosis not present

## 2020-03-22 DIAGNOSIS — I130 Hypertensive heart and chronic kidney disease with heart failure and stage 1 through stage 4 chronic kidney disease, or unspecified chronic kidney disease: Principal | ICD-10-CM | POA: Diagnosis present

## 2020-03-22 DIAGNOSIS — N1832 Chronic kidney disease, stage 3b: Secondary | ICD-10-CM | POA: Diagnosis present

## 2020-03-22 DIAGNOSIS — R600 Localized edema: Secondary | ICD-10-CM | POA: Insufficient documentation

## 2020-03-22 DIAGNOSIS — I10 Essential (primary) hypertension: Secondary | ICD-10-CM

## 2020-03-22 DIAGNOSIS — M810 Age-related osteoporosis without current pathological fracture: Secondary | ICD-10-CM | POA: Diagnosis present

## 2020-03-22 DIAGNOSIS — Z87891 Personal history of nicotine dependence: Secondary | ICD-10-CM

## 2020-03-22 DIAGNOSIS — Z20822 Contact with and (suspected) exposure to covid-19: Secondary | ICD-10-CM | POA: Diagnosis present

## 2020-03-22 DIAGNOSIS — J449 Chronic obstructive pulmonary disease, unspecified: Secondary | ICD-10-CM | POA: Diagnosis not present

## 2020-03-22 DIAGNOSIS — E785 Hyperlipidemia, unspecified: Secondary | ICD-10-CM | POA: Diagnosis present

## 2020-03-22 DIAGNOSIS — I5031 Acute diastolic (congestive) heart failure: Secondary | ICD-10-CM

## 2020-03-22 LAB — TROPONIN I (HIGH SENSITIVITY)
Troponin I (High Sensitivity): 16 ng/L (ref <18)
Troponin I (High Sensitivity): 18 ng/L — ABNORMAL HIGH (ref <18)

## 2020-03-22 LAB — CBC
HCT: 40 % (ref 36.0–46.0)
Hemoglobin: 13.4 g/dL (ref 12.0–15.0)
MCH: 27.7 pg (ref 26.0–34.0)
MCHC: 33.5 g/dL (ref 30.0–36.0)
MCV: 82.8 fL (ref 80.0–100.0)
Platelets: 337 10*3/uL (ref 150–400)
RBC: 4.83 MIL/uL (ref 3.87–5.11)
RDW: 15.6 % — ABNORMAL HIGH (ref 11.5–15.5)
WBC: 10 10*3/uL (ref 4.0–10.5)
nRBC: 0 % (ref 0.0–0.2)

## 2020-03-22 LAB — COMPREHENSIVE METABOLIC PANEL
ALT: 22 U/L (ref 0–44)
AST: 22 U/L (ref 15–41)
Albumin: 3.9 g/dL (ref 3.5–5.0)
Alkaline Phosphatase: 110 U/L (ref 38–126)
Anion gap: 9 (ref 5–15)
BUN: 26 mg/dL — ABNORMAL HIGH (ref 8–23)
CO2: 28 mmol/L (ref 22–32)
Calcium: 9.5 mg/dL (ref 8.9–10.3)
Chloride: 98 mmol/L (ref 98–111)
Creatinine, Ser: 1.16 mg/dL — ABNORMAL HIGH (ref 0.44–1.00)
GFR calc Af Amer: 49 mL/min — ABNORMAL LOW (ref >60)
GFR calc non Af Amer: 43 mL/min — ABNORMAL LOW (ref >60)
Glucose, Bld: 94 mg/dL (ref 70–99)
Potassium: 4.7 mmol/L (ref 3.5–5.1)
Sodium: 135 mmol/L (ref 135–145)
Total Bilirubin: 1.6 mg/dL — ABNORMAL HIGH (ref 0.3–1.2)
Total Protein: 7.4 g/dL (ref 6.5–8.1)

## 2020-03-22 LAB — BLOOD GAS, VENOUS
Acid-Base Excess: 1.4 mmol/L (ref 0.0–2.0)
Bicarbonate: 27.2 mmol/L (ref 20.0–28.0)
O2 Saturation: 75.8 %
Patient temperature: 37
pCO2, Ven: 47 mmHg (ref 44.0–60.0)
pH, Ven: 7.37 (ref 7.250–7.430)
pO2, Ven: 42 mmHg (ref 32.0–45.0)

## 2020-03-22 LAB — PROCALCITONIN: Procalcitonin: <0.1 ng/mL

## 2020-03-22 LAB — SARS CORONAVIRUS 2 BY RT PCR (HOSPITAL ORDER, PERFORMED IN ~~LOC~~ HOSPITAL LAB): SARS Coronavirus 2: NEGATIVE

## 2020-03-22 LAB — FIBRIN DERIVATIVES D-DIMER (ARMC ONLY): Fibrin derivatives D-dimer (ARMC): 1559.23 ng/mL (FEU) — ABNORMAL HIGH (ref 0.00–499.00)

## 2020-03-22 LAB — BRAIN NATRIURETIC PEPTIDE: B Natriuretic Peptide: 1976.3 pg/mL — ABNORMAL HIGH (ref 0.0–100.0)

## 2020-03-22 MED ORDER — SODIUM CHLORIDE 0.9% FLUSH: 3.0000 mL | INTRAVENOUS | Status: DC | PRN

## 2020-03-22 MED ORDER — METHYLPREDNISOLONE SODIUM SUCC 40 MG IJ SOLR
40.0000 mg | Freq: Once | INTRAMUSCULAR | Status: AC
  Administered 2020-03-22: 40 mg via INTRAVENOUS
  Filled 2020-03-22: qty 1

## 2020-03-22 MED ORDER — IPRATROPIUM-ALBUTEROL 0.5-2.5 (3) MG/3ML IN SOLN: 3.0000 mL | RESPIRATORY_TRACT | Status: DC | PRN

## 2020-03-22 MED ORDER — FUROSEMIDE 10 MG/ML IJ SOLN
40.0000 mg | Freq: Two times a day (BID) | INTRAMUSCULAR | Status: DC
  Administered 2020-03-22 – 2020-03-24 (×4): 40 mg via INTRAVENOUS
  Filled 2020-03-22 (×4): qty 4

## 2020-03-22 MED ORDER — SODIUM CHLORIDE 0.9% FLUSH: 3.0000 mL | Freq: Once | INTRAVENOUS | Status: DC

## 2020-03-22 MED ORDER — LISINOPRIL 5 MG PO TABS
2.5000 mg | ORAL_TABLET | Freq: Every day | ORAL | Status: DC
  Administered 2020-03-22 – 2020-03-25 (×4): 2.5 mg via ORAL
  Filled 2020-03-22 (×4): qty 1

## 2020-03-22 MED ORDER — IOHEXOL 350 MG/ML SOLN
60.0000 mL | Freq: Once | INTRAVENOUS | Status: AC | PRN
  Administered 2020-03-23: 60 mL via INTRAVENOUS
  Filled 2020-03-22: qty 60

## 2020-03-22 MED ORDER — NITROGLYCERIN 2 % TD OINT
0.5000 [in_us] | TOPICAL_OINTMENT | Freq: Once | TRANSDERMAL | Status: AC
  Administered 2020-03-22: 0.5 [in_us] via TOPICAL
  Filled 2020-03-22: qty 1

## 2020-03-22 MED ORDER — SODIUM CHLORIDE 0.9% FLUSH
3.0000 mL | Freq: Two times a day (BID) | INTRAVENOUS | Status: DC
  Administered 2020-03-23 – 2020-03-24 (×4): 3 mL via INTRAVENOUS

## 2020-03-22 MED ORDER — ONDANSETRON HCL 4 MG/2ML IJ SOLN: 4.0000 mg | Freq: Four times a day (QID) | INTRAMUSCULAR | Status: DC | PRN

## 2020-03-22 MED ORDER — SODIUM CHLORIDE 0.9 % IV SOLN: 250.0000 mL | INTRAVENOUS | Status: DC | PRN

## 2020-03-22 MED ORDER — CARVEDILOL 3.125 MG PO TABS
3.1250 mg | ORAL_TABLET | Freq: Two times a day (BID) | ORAL | Status: DC
  Administered 2020-03-22 – 2020-03-25 (×6): 3.125 mg via ORAL
  Filled 2020-03-22 (×6): qty 1

## 2020-03-22 MED ORDER — ACETAMINOPHEN 325 MG PO TABS
650.0000 mg | ORAL_TABLET | ORAL | Status: DC | PRN
  Administered 2020-03-23 (×2): 650 mg via ORAL
  Filled 2020-03-22 (×2): qty 2

## 2020-03-22 MED ORDER — ENOXAPARIN SODIUM 30 MG/0.3ML ~~LOC~~ SOLN
30.0000 mg | SUBCUTANEOUS | Status: DC
  Administered 2020-03-23 – 2020-03-25 (×3): 30 mg via SUBCUTANEOUS
  Filled 2020-03-22 (×3): qty 0.3

## 2020-03-22 MED ORDER — ALBUTEROL SULFATE (2.5 MG/3ML) 0.083% IN NEBU
3.0000 mL | INHALATION_SOLUTION | RESPIRATORY_TRACT | Status: DC | PRN
  Administered 2020-03-22: 3 mL via RESPIRATORY_TRACT
  Filled 2020-03-22: qty 3

## 2020-03-22 MED ORDER — FUROSEMIDE 10 MG/ML IJ SOLN
40.0000 mg | Freq: Once | INTRAMUSCULAR | Status: AC
  Administered 2020-03-22: 40 mg via INTRAVENOUS
  Filled 2020-03-22: qty 4

## 2020-03-22 MED ORDER — MOMETASONE FURO-FORMOTEROL FUM 200-5 MCG/ACT IN AERO
2.0000 | INHALATION_SPRAY | Freq: Two times a day (BID) | RESPIRATORY_TRACT | Status: DC
  Administered 2020-03-23 – 2020-03-24 (×4): 2 via RESPIRATORY_TRACT
  Filled 2020-03-22: qty 8.8

## 2020-03-22 NOTE — ED Notes (Signed)
Pt SpO2 85%. Randol Kern NP and RT contacted. Pt placed back on NRB

## 2020-03-22 NOTE — ED Notes (Signed)
Lab contacted to add trop

## 2020-03-22 NOTE — ED Triage Notes (Signed)
See this RN's first RN note; pt with noted labored and tachypneic respirations on arrival to triage. Pt with noted difficulty speaking in complete sentences.   Pt with swelling noted to bilateral lower extremities.

## 2020-03-22 NOTE — ED Provider Notes (Signed)
[facility] Emergency Department Provider Note   ____________________________________________   First MD Initiated Contact with Patient 03/22/20 1457     (approximate)  I have reviewed the triage vital signs and the nursing notes.   HISTORY  Chief Complaint Shortness of Breath    HPI Stephanie Collins is a 86 y.o. female with past medical history of hypertension, COPD, and lung cancer status post lobectomy who presents to the ED complaining of shortness of breath.  Patient reports that she has had increasing difficulty breathing over the past couple of days along with swelling in both of her legs.  She states it is especially hard to breathe when she goes to lay flat and she is having a dry cough.  She denies any fevers or chest pain, has not had any vomiting or diarrhea.  She states she has been vaccinated against COVID-19 and current symptoms do not feel similar to prior COPD exacerbations.  She denies any history of CHF or other cardiac history.        Past Medical History:  Diagnosis Date  . Cancer ([place]) 2010   lung  . Cough 01/19/2016  . Hypertension   . Lung cancer ([place]) 01/19/2016    Patient Active Problem List   Diagnosis Date Noted  . Cough 01/19/2016  . Malignant neoplasm of lower lobe, right bronchus or lung (CODE) 01/19/2016  . Chronic obstructive pulmonary disease ([place]) 07/28/2015  . H/O malignant neoplasm 07/28/2015  . BP (high blood pressure) 07/28/2015  . Arthritis, degenerative 07/28/2015  . OP (osteoporosis) 07/28/2015  . Abnormal loss of weight 04/26/2015  . 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 BREAST BIOPSY Left 2011   benign  . KYPHOPLASTY N/A 02/01/2015   Procedure: KYPHOPLASTY;  Surgeon: Hessie Knows, MD;  Location: [facility] ORS;  Service: Orthopedics;  Laterality: N/A;  T10    Prior to Admission medications   Medication Sig Start Date End Date Taking? Authorizing Provider    albuterol-ipratropium (COMBIVENT) 18-103 MCG/ACT inhaler Inhale 1 puff into the lungs 4 (four) times daily. 07/28/15   Forest Gleason, MD  dextromethorphan (DELSYM) 30 MG/5ML liquid Take 15 mg by mouth 3 (three) times daily as needed for cough.    [provider]  Fluticasone-Salmeterol (ADVAIR) 250-50 MCG/DOSE AEPB Inhale 1 puff into the lungs 2 (two) times daily.    [provider]  triamterene-hydrochlorothiazide (MAXZIDE-25) 37.5-25 MG per tablet Take 1 tablet by mouth daily.    [provider]    Allergies Tramadol  Family History  Problem Relation Age of Onset  . Breast cancer Neg Hx     Social History Social History   Tobacco Use  . Smoking status: Former Smoker  Substance Use Topics  . Alcohol use: No    Alcohol/week: 0.0 standard drinks  . Drug use: No    Review of Systems  Constitutional: No fever/chills Eyes: No visual changes. ENT: No sore throat. Cardiovascular: Denies chest pain. Respiratory: Positive for cough and shortness of breath. Gastrointestinal: No abdominal pain.  No nausea, no vomiting.  No diarrhea.  No constipation. Genitourinary: Negative for dysuria. Musculoskeletal: Negative for back pain.  Positive for leg swelling.   Skin: Negative for rash. Neurological: Negative for headaches, focal weakness or numbness.  ____________________________________________   PHYSICAL EXAM:  VITAL SIGNS: ED Triage Vitals  Enc Vitals Group     BP 03/22/20 1424 (!) 145/84     Pulse Rate 03/22/20 1424  85     Resp 03/22/20 1424 (!) 48     Temp --      Temp Source 03/22/20 1424 Oral     SpO2 03/22/20 1424 98 %     Weight 03/22/20 1424 115 lb (52.2 kg)     Height 03/22/20 1424 5' (1.524 m)     Head Circumference --      Peak Flow --      Pain Score 03/22/20 1428 0     Pain Loc --      Pain Edu? --      Excl. in [place]? --     Constitutional: Alert and oriented. Eyes: Conjunctivae are normal. Head: Atraumatic. Nose: No  congestion/rhinnorhea. Mouth/Throat: Mucous membranes are moist. Neck: Normal ROM Cardiovascular: Normal rate, regular rhythm. Grossly normal heart sounds. Respiratory: Tachypneic with increased respiratory effort and accessory muscle use, crackles to bilateral bases. Gastrointestinal: Soft and nontender. No distention. Genitourinary: deferred Musculoskeletal: No lower extremity tenderness, 3+ pitting edema to knees bilaterally. Neurologic:  Normal speech and language. No gross focal neurologic deficits are appreciated. Skin:  Skin is warm, dry and intact. No rash noted. Psychiatric: Mood and affect are normal. Speech and behavior are normal.  ____________________________________________   LABS (all labs ordered are listed, but only abnormal results are displayed)  Labs Reviewed  CBC - Abnormal; Notable for the following components:      Result Value   RDW 15.6 (*)    All other components within normal limits  COMPREHENSIVE METABOLIC PANEL - Abnormal; Notable for the following components:   BUN 26 (*)    Creatinine, Ser 1.16 (*)    Total Bilirubin 1.6 (*)    GFR calc non Af Amer 43 (*)    GFR calc Af Amer 49 (*)    All other components within normal limits  BRAIN NATRIURETIC PEPTIDE - Abnormal; Notable for the following components:   B Natriuretic Peptide 1,976.3 (*)    All other components within normal limits  SARS CORONAVIRUS 2 BY RT PCR (HOSPITAL ORDER, [place] [facility])  BLOOD GAS, VENOUS  TROPONIN I (HIGH SENSITIVITY)   ____________________________________________  EKG  ED ECG REPORT I, Blake Divine, the attending physician, personally viewed and interpreted this ECG.   Date: 03/22/2020  EKG Time: 14:29  Rate: 83  Rhythm: normal sinus rhythm, PAC's noted  Axis: RAD  Intervals:none  ST&T Change: Nonspecific ST/T changes   PROCEDURES  Procedure(s) performed (including Critical Care):  .Critical Care Performed by: Blake Divine,  MD Authorized by: Blake Divine, MD   Critical care provider statement:    Critical care time (minutes):  45   Critical care time was exclusive of:  Separately billable procedures and treating other patients and teaching time   Critical care was necessary to treat or prevent imminent or life-threatening deterioration of the following conditions:  Respiratory failure   Critical care was time spent personally by me on the following activities:  Discussions with consultants, evaluation of patient's response to treatment, examination of patient, ordering and performing treatments and interventions, ordering and review of laboratory studies, ordering and review of radiographic studies, pulse oximetry, re-evaluation of patient's condition, obtaining history from patient or surrogate and review of old charts   I assumed direction of critical care for this patient from another provider in my specialty: no       ____________________________________________   INITIAL IMPRESSION / ASSESSMENT AND PLAN / ED COURSE       86 year old female  with past medical history of hypertension, COPD, lung cancer status post lobectomy who presents to the ED complaining of increasing shortness of breath as well as leg swelling over the past couple of days.  She was initially seen at her PCPs office, who noted her O2 sats to be in the 80s on room air with significant tachypnea and leg swelling.  She appears clinically fluid overloaded and I suspect her symptoms are due to CHF, no wheezing noted on exam to suggest COPD exacerbation.  Given her increased work of breathing and tachypnea, patient was transitioned to BiPAP and states this has helped her feel much better.  Chest x-ray shows bilateral pleural effusions and pulmonary edema by my read.  Low suspicion for pneumonia as her cough is nonproductive, she has not had any fevers or white count.  VBG is reassuring, will also treat with Nitropaste and Lasix.  Patient turned  over to oncoming provider pending reassessment and admission.      ____________________________________________   FINAL CLINICAL IMPRESSION(S) / ED DIAGNOSES  Final diagnoses:  Acute congestive heart failure, unspecified heart failure type [facility])     ED Discharge Orders    None       Note:  This document was prepared using Dragon voice recognition software and may include unintentional dictation errors.   Blake Divine, MD 03/22/20 [phone]

## 2020-03-22 NOTE — ED Notes (Signed)
Report called to Suzzanne Cloud floor nurse

## 2020-03-22 NOTE — ED Notes (Signed)
Randol Kern NP and RT at bedside, pt placed back on 4L [place]. SpO2 94%

## 2020-03-22 NOTE — ED Notes (Addendum)
RN to bedside, pt reports SHOB, RR labored. 90% on 5L [place]. RT contacted and Randol Kern NP contacted. Placed on NRB

## 2020-03-22 NOTE — ED Notes (Signed)
Pt found to have urinated in bed. Purewick had becoming dislodged. Pt reports she urinated twice so far. Pt linens changed and purewick replaced.

## 2020-03-22 NOTE — ED Notes (Signed)
Pt to room from xray. Pt with deep congested cough and labored breathing. Pt placed on bipap per Dr. Charna Archer.

## 2020-03-22 NOTE — H&P (Signed)
[place] at [place] NAME: Stephanie Collins    MR#:  [id number]  DATE OF BIRTH:  June 23, 1932  DATE OF ADMISSION:  03/22/2020  PRIMARY CARE PHYSICIAN: Baxter Hire, MD   REQUESTING/REFERRING PHYSICIAN: Dr. Kerman Passey  Patient coming from : [facility] clinic   CHIEF COMPLAINT:   Increasing shortness of breath and leg edema HISTORY OF PRESENT ILLNESS:  Stephanie Collins  is a 86 y.o. female with a known history of COPD, X smoker, history of lung cancer status post right lower lobectomy, hyperlipidemia, hypertension, osteoarthritis and osteoporosis comes to the emergency room accompanied by her daughter with increasing shortness of breath bilateral lower extremity swelling for 3 to 4 days.  She was seen at Dr. Tillman Sers office. She was hypoxic with sats in the 80s sent to the emergency room.  ED course: in the ER patient was tachypnea tachycardic. She was found to have elevated BNP to 1900 with chest x-ray consistent with pulmonary edema. She was placed on BiPAP. Received IV Lasix of 40 mg times one patient clinically feels improved. She is been off BiPAP and on 3 L nasal cannula oxygen sats are 95 to 97%  patient is being admitted for acute congestive heart failure. EF unknown. Cannot find any old echo.  PAST MEDICAL HISTORY:   Past Medical History:  Diagnosis Date  . Cancer ([place]) 2010   lung  . Cough 01/19/2016  . Hypertension   . Lung cancer ([place]) 01/19/2016    PAST SURGICAL HISTOIRY:   Past Surgical History:  Procedure Laterality Date  . BREAST BIOPSY Left 2011   benign  . KYPHOPLASTY N/A 02/01/2015   Procedure: KYPHOPLASTY;  Surgeon: Hessie Knows, MD;  Location: [facility] ORS;  Service: Orthopedics;  Laterality: N/A;  T10    SOCIAL HISTORY:   Social History   Tobacco Use  . Smoking status: Former Smoker  Substance Use Topics  . Alcohol use: No    Alcohol/week: 0.0 standard drinks    FAMILY HISTORY:   Family History  Problem Relation  Age of Onset  . Breast cancer Neg Hx     DRUG ALLERGIES:   Allergies  Allergen Reactions  . Tramadol     confusion    REVIEW OF SYSTEMS:  Review of Systems  Constitutional: Negative for chills, fever and weight loss.  HENT: Negative for ear discharge, ear pain and nosebleeds.   Eyes: Negative for blurred vision, pain and discharge.  Respiratory: Positive for shortness of breath. Negative for sputum production, wheezing and stridor.   Cardiovascular: Positive for orthopnea, leg swelling and PND. Negative for chest pain and palpitations.  Gastrointestinal: Negative for abdominal pain, diarrhea, nausea and vomiting.  Genitourinary: Negative for frequency and urgency.  Musculoskeletal: Negative for back pain and joint pain.  Neurological: Positive for weakness. Negative for sensory change, speech change and focal weakness.  Psychiatric/Behavioral: Negative for depression and hallucinations. The patient is not nervous/anxious.      MEDICATIONS AT HOME:   Prior to Admission medications   Medication Sig Start Date End Date Taking? Authorizing Provider  amLODipine (NORVASC) 5 MG tablet Take 5 mg by mouth daily. 01/18/20  Yes [provider]  Fluticasone-Salmeterol (ADVAIR) 250-50 MCG/DOSE AEPB Inhale 1 puff into the lungs 2 (two) times daily.   Yes [provider]  albuterol (VENTOLIN HFA) 108 (90 Base) MCG/ACT inhaler Inhale 2 puffs into the lungs every 4 (four) hours as needed for wheezing or shortness of breath. 01/04/20   [provider]      VITAL SIGNS:  Blood pressure (!) 145/84, pulse 85, resp. rate (!) 48, height 5' (1.524 m), weight 52.2 kg, SpO2 98 %.  PHYSICAL EXAMINATION:  GENERAL:  86 y.o.-year-old patient lying in the bed with no acute distress. Thin friale EYES: Pupils equal, round, reactive to light and accommodation. No scleral icterus.  HEENT: Head atraumatic, normocephalic. Oropharynx and nasopharynx clear.  NECK:  Supple, no jugular  venous distention. No thyroid enlargement, no tenderness.  LUNGS: distnat breath sounds bilaterally, no wheezing, ++rales,norhonchi or crepitation. No use of accessory muscles of respiration.  CARDIOVASCULAR: S1, S2 normal. No murmurs, rubs, or gallops.  ABDOMEN: Soft, nontender, nondistended. Bowel sounds present. No organomegaly or mass.  EXTREMITIES:    NEUROLOGIC: Cranial nerves II through XII are intact. Muscle strength 5/5 in all extremities. Sensation intact. Gait not checked.  PSYCHIATRIC: The patient is alert and oriented x 3.  SKIN: as above   LABORATORY PANEL:   CBC Recent Labs  Lab 03/22/20 1434  WBC 10.0  HGB 13.4  HCT 40.0  PLT 337   ------------------------------------------------------------------------------------------------------------------  Chemistries  Recent Labs  Lab 03/22/20 1434  NA 135  K 4.7  CL 98  CO2 28  GLUCOSE 94  BUN 26*  CREATININE 1.16*  CALCIUM 9.5  AST 22  ALT 22  ALKPHOS 110  BILITOT 1.6*   ------------------------------------------------------------------------------------------------------------------  Cardiac Enzymes No results for input(s): TROPONINI in the last 168 hours. ------------------------------------------------------------------------------------------------------------------  RADIOLOGY:  DG Chest 2 View  Result Date: 03/22/2020 CLINICAL DATA:  86 year old female with shortness of breath. EXAM: CHEST - 2 VIEW COMPARISON:  Chest radiograph dated 01/19/2016 FINDINGS: Small bilateral pleural effusions, right greater left. There are bibasilar densities which may represent atelectasis or infiltrate. There is background of chronic interstitial coarsening and bronchitic changes. No pneumothorax. Stable cardiac silhouette. Atherosclerotic calcification of the aorta. There is a moderate size hiatal hernia. Osteopenia with degenerative changes of the spine and old multilevel compression fractures and vertebroplasty.  IMPRESSION: Small bilateral pleural effusions with bibasilar atelectasis versus infiltrate. Electronically Signed   By: Anner Crete M.D.   On: 03/22/2020 15:14    EKG:    IMPRESSION AND PLAN:   Stephanie Collins  is a 86 y.o. female with a known history of COPD, X smoker, history of lung cancer status post right lower lobectomy, hyperlipidemia, hypertension, osteoarthritis and osteoporosis comes to the emergency room accompanied by her daughter with increasing shortness of breath bilateral lower extremity swelling for 3 to 4 days. She was seen at Dr. Tillman Sers office. She was hypoxic with sats in the 80s sent to the emergency room.  1. acute hypoxic respiratory failure secondary to CHF acute onset. Unknown EF -admit to progressive cardiac unit -IV Lasix 40 mg BID-start patient on low-dose Coreg, low-dose lisinopril. -Monitor input output  -reds vest reading -cardiology consultation with Dr. Ubaldo Glassing -follow-up metabolic panel -continue oxygen to keep sats greater than 93%. Assess for home oxygen use  2. COPD chronic with mild -currently not wheezing -continue bronchodilators as needed -oxygen  3. Bilateral lower extremity edema -treat as above -hold amlodipine  4. DVT prophylaxis -Lovenox subcu daily patient has a tendency to bleed in her skin very easily. If she has significant-bruises/hematoma will discontinue Lovenox   Family Communication : daughter in the room Consults : cardiology Code Status : full code per daughter DVT prophylaxis : Lovenox admission status: inpatient  TOTAL TIME TAKING CARE OF THIS PATIENT: **50* minutes.    Fritzi Mandes  M.D  [facility]     CC: Primary care physician; Baxter Hire, MD

## 2020-03-22 NOTE — ED Notes (Signed)
Pt to go on bipap per Randol Kern NP

## 2020-03-22 NOTE — Progress Notes (Signed)
Patient on bipap with minimal setting and 35%. Patient tolerating well.

## 2020-03-22 NOTE — ED Triage Notes (Signed)
First RN Note: pt presents from [facility] with c/o SOB, CHF, per [facility] MD pt 85% on RA, KC reports pt up to 96% on 3L. Pt O2 checked by this RN upon arrival to ED, pt O2 noted to be 97% on 3L via [place].

## 2020-03-22 NOTE — ED Notes (Signed)
Breathing better after alb treatment.  Pt alert.  Iv in place.

## 2020-03-22 NOTE — ED Notes (Signed)
Pt reports cramping in legs.  Pt having diff breathing.  Alb treatment given. Pt alert.  Iv in place.

## 2020-03-22 NOTE — ED Notes (Signed)
Respiratory at bedside to place bipap

## 2020-03-23 ENCOUNTER — Encounter: Payer: Self-pay | Admitting: Internal Medicine

## 2020-03-23 DIAGNOSIS — I5031 Acute diastolic (congestive) heart failure: Secondary | ICD-10-CM

## 2020-03-23 LAB — ECHOCARDIOGRAM COMPLETE
Area-P 1/2: 2.69 cm2
Height: 60 in
S' Lateral: 2.52 cm
Weight: 1840 oz

## 2020-03-23 LAB — GLUCOSE, CAPILLARY: Glucose-Capillary: 114 mg/dL — ABNORMAL HIGH (ref 70–99)

## 2020-03-23 MED ORDER — TRAZODONE HCL 50 MG PO TABS
50.0000 mg | ORAL_TABLET | Freq: Every evening | ORAL | Status: DC | PRN
  Administered 2020-03-23: 50 mg via ORAL
  Filled 2020-03-23: qty 1

## 2020-03-23 MED ORDER — PREDNISONE 20 MG PO TABS
40.0000 mg | ORAL_TABLET | Freq: Every day | ORAL | Status: DC
  Administered 2020-03-23 – 2020-03-25 (×3): 40 mg via ORAL
  Filled 2020-03-23 (×4): qty 2

## 2020-03-23 NOTE — Progress Notes (Signed)
Pt. Taken off BIPAP and placed on 4LPM via nasal cannula. SpO2 94%. Pt. Tolerating well with no shortness of breath noted. Will continue to monitor.

## 2020-03-23 NOTE — TOC Initial Note (Signed)
Transition of Care [facility]) - Initial/Assessment Note    Patient Details  Name: Stephanie Collins MRN: [id number] Date of Birth: 1932/07/17  Transition [facility] CM/SW Contact:    Shelbie Ammons, RN Phone Number: 03/23/2020, 1:25 PM  Clinical Narrative:      RNCM met with patient at bedside, patient lives at home alone independently but her daughter checks on her daily. Patient and daughter both report that patient had been doing fairly well until she started having swelling issues earlier this year. Daughter reports this was reported to her PCP but nothing was done about it. Patient reports that up until now she had only been weighing occasionally but now they both understand that she should weigh daily at the same time every morning and she should record her weights. If she gains 2-3 pounds in a day or 5 pounds in a week she should get in contact with her MD. She also understands that she will likely be scheduled to follow up with the heart failure clinic. Both patient and daughter feel that she has everything she needs. RNCM will follow for any further needs.              Expected Discharge Plan: Home/Self Care Barriers to Discharge: Continued Medical Work up   Patient Goals and CMS Choice        Expected Discharge Plan and Services Expected Discharge Plan: Home/Self Care   Discharge Planning Services: CM Consult                                          Prior Living Arrangements/Services   Lives with:: Self Patient language and need for interpreter reviewed:: Yes Do you feel safe going back to the place where you live?: Yes      Need for Family Participation in Patient Care: Yes (Comment) Care giver support system in place?: Yes (comment)   Criminal Activity/Legal Involvement Pertinent to Current Situation/Hospitalization: No - Comment as needed  Activities of Daily Living Home Assistive Devices/Equipment: Walker (specify type) ADL Screening (condition at time of  admission) Patient's cognitive ability adequate to safely complete daily activities?: Yes Is the patient deaf or have difficulty hearing?: No Does the patient have difficulty seeing, even when wearing glasses/contacts?: No Does the patient have difficulty concentrating, remembering, or making decisions?: No Patient able to express need for assistance with ADLs?: Yes Does the patient have difficulty dressing or bathing?: No Independently performs ADLs?: Yes (appropriate for developmental age) Does the patient have difficulty walking or climbing stairs?: No Weakness of Legs: None Weakness of Arms/Hands: None  Permission Sought/Granted Permission sought to share information with : Family Supports    Share Information with NAME: daughter Caren Griffins           Emotional Assessment Appearance:: Appears stated age Attitude/Demeanor/Rapport: Engaged Affect (typically observed): Appropriate, Calm Orientation: : Oriented to Self, Oriented to Place, Oriented to  Time, Oriented to Situation Alcohol / Substance Use: Not Applicable Psych Involvement: No (comment)  Admission diagnosis:  Acute CHF (congestive heart failure) (HCC) [I50.9] Acute congestive heart failure, unspecified heart failure type [facility]) [I50.9] Patient Active Problem List   Diagnosis Date Noted  . Acute CHF (congestive heart failure) ([place]) 03/22/2020  . Leg edema   . Cough 01/19/2016  . Malignant neoplasm of lower lobe, right bronchus or lung (CODE) 01/19/2016  . Chronic obstructive pulmonary disease ([place]) 07/28/2015  .  H/O malignant neoplasm 07/28/2015  . BP (high blood pressure) 07/28/2015  . Arthritis, degenerative 07/28/2015  . OP (osteoporosis) 07/28/2015  . Abnormal loss of weight 04/26/2015  . 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

## 2020-03-23 NOTE — Progress Notes (Signed)
Cross Cover Brief Note Was called with patient with new onset heart failure was in severe resp distresss and decreased oxygen saturations. Course rhonchi and wheeze noted without need for scope with increased work of breathing.  Course dry cough, non productive, Patient endorses history of COPD and Right lung lobectomy for lung cancer Patient briefly improved after breathing treatment and was able to resume oxygen via nasal cannula but distressed returned shortly after and patient was placed on bipap. Given COPD history, patient was started on steroids for likely COPD exacerbation superimposed on pulmonary edema. Procalcitonin and d dimer. Ordered. CT followed secondary to elevated D dimer  FINDINGS: Cardiovascular: End of the central and segmental pulmonary arteries. Extrinsic impression on the distal right lobar pulmonary artery causing partial effacement but the vessel remains patent. No intrinsic filling defects are demonstrated. Cardiac enlargement with prominence of the right heart. Reflux of contrast material into the IVC may indicate right heart failure. Normal caliber thoracic aorta with diffuse calcification.  Mediastinum/Nodes: Esophageal hiatal hernia. Mildly dilated esophagus possibly due to reflux disease. Masslike density in the right lower mediastinum likely represents the fluid-filled herniated stomach. The EG junction appears to be at the normal locations suggesting a paraesophageal hernia. Scattered lymph nodes are not pathologically enlarged.  Lungs/Pleura: Prominent diffuse emphysematous changes throughout the lungs. Peripheral interstitial fibrosis. There is obstruction of the bronchus intermedius on the right, possibly indicating aspirated material, mucoid impaction, or an endobronchial lesion. Consider referral for bronchoscopy if clinically indicated. Mild atelectasis in the lung bases.  Upper Abdomen: No acute abnormality.  Musculoskeletal: Degenerative  changes throughout the spine. Multiple thoracic vertebral compression deformities post kyphoplasty changes.  Review of the MIP images confirms the above findings.  IMPRESSION: 1. No evidence of acute pulmonary embolus. Extrinsic impression on the distal right lobar pulmonary artery causing partial effacement but the vessel remains patent. 2. Cardiac enlargement with evidence of right heart failure. 3. Prominent diffuse emphysematous changes throughout the lungs. Peripheral interstitial fibrosis. 4. Obstruction of the bronchus intermedius on the right, possibly indicating aspirated material, mucoid impaction, or an endobronchial lesion. Consider referral for bronchoscopy if clinically indicated. 5. Masslike density in the right lower mediastinum likely represents the fluid-filled herniated stomach. 6. Esophageal hiatal hernia. With much of the stomach in the chest, possibly a paraesophageal hernia. 7. Multiple thoracic vertebral compression deformities post kyphoplasty changes. 8. Emphysema and aortic atherosclerosis.  Aortic Atherosclerosis (ICD10-I70.0) and Emphysema (ICD10-J43.9).   Given CT finding, requested pulmonary consult Patient had also been given one dose of IV methylprednisolone, daily prednisone for 5 days to start Procaalcitonin not elevated and antibiotics not started

## 2020-03-23 NOTE — Progress Notes (Signed)
Patient transported to CT on Bipap and back to ED room, then immediately transported to room 251. No Complications during transport.

## 2020-03-23 NOTE — Progress Notes (Signed)
PHARMACIST - PHYSICIAN COMMUNICATION  CONCERNING:  Enoxaparin (Lovenox) for DVT Prophylaxis    RECOMMENDATION: Patient was prescribed enoxaprin 40mg  q24 hours for VTE prophylaxis.   Filed Weights   03/22/20 1424 03/23/20 0131  Weight: 52.2 kg (115 lb) 42.2 kg (93 lb)    Body mass index is 18.16 kg/m.  Estimated Creatinine Clearance: 23.2 mL/min (A) (by C-G formula based on SCr of 1.16 mg/dL (H)).   Patient is candidate for enoxaparin 30mg  every 24 hours based on CrCl <20ml/min or Weight less then 45kg for women or <57kg for men   DESCRIPTION: Pharmacy has adjusted enoxaparin dose per [facility]/[place] policy.  Patient is now receiving enoxaparin 30mg  every 24 hours.  Ena Dawley, PharmD Clinical Pharmacist  03/23/2020 6:56 AM

## 2020-03-23 NOTE — Consult Note (Signed)
CARDIOLOGY CONSULT NOTE               Patient ID: Stephanie Collins MRN: [id number] DOB/AGE: May 17, 1932 86 y.o.  Admit date: 03/22/2020 Referring Physician Dr. Fritzi Mandes  Primary Physician Dr. Harrel Lemon  Primary Cardiologist n/a  Reason for Consultation Acute CHF   HPI: Stephanie Collins is an 86 year old female with a past medical history significant for lung cancer s/p right lower lobectomy, non-oxygen dependent COPD, former tobacco abuse, hyperlipidemia, and hypertension who presented to the ED from Dr. Tillman Sers office for a few day history of increased shortness of breath and lower extremity swelling.  Workup in the ED was significant for O2 stats in the 80s on room air, BNP of 1976, chest xray revealing small bilateral pleural effusions, chest CT revealing no evidence of a PE with evidence of right heart failure, high sensitivity troponin 16 and 18 respectively, and ECG revealing normal sinus rhythm with no obvious acute ischemic changes.  She was started on BiPAP and has since been transitioned to [facility].   03/23/20: Stephanie Collins reports significant improvement in shortness of breath and lower extremity swelling since arrival.  She is currently sitting up in bed, eating breakfast, in no acute distress.  She denies chest pain, chest pressure, palpitations, or heart racing sensation.  She denies orthopnea or PND.   Review of systems complete and found to be negative unless listed above     Past Medical History:  Diagnosis Date  . Cancer ([place]) 2010   lung  . Cough 01/19/2016  . Hypertension   . Lung cancer ([place]) 01/19/2016    Past Surgical History:  Procedure Laterality Date  . BREAST BIOPSY Left 2011   benign  . KYPHOPLASTY N/A 02/01/2015   Procedure: KYPHOPLASTY;  Surgeon: Hessie Knows, MD;  Location: [facility] ORS;  Service: Orthopedics;  Laterality: N/A;  T10    Medications Prior to Admission  Medication Sig Dispense Refill Last Dose  . amLODipine (NORVASC) 5 MG tablet Take 5 mg by mouth daily.    03/22/2020 at 0900  . Fluticasone-Salmeterol (ADVAIR) 250-50 MCG/DOSE AEPB Inhale 1 puff into the lungs 2 (two) times daily.   03/22/2020 at 0930  . albuterol (VENTOLIN HFA) 108 (90 Base) MCG/ACT inhaler Inhale 2 puffs into the lungs every 4 (four) hours as needed for wheezing or shortness of breath.   prn at prn   Social History   Socioeconomic History  . Marital status: Widowed    Spouse name: Not on file  . Number of children: Not on file  . Years of education: Not on file  . Highest education level: Not on file  Occupational History  . Not on file  Tobacco Use  . Smoking status: Former Smoker  Substance and Sexual Activity  . Alcohol use: No    Alcohol/week: 0.0 standard drinks  . Drug use: No  . Sexual activity: Not on file  Other Topics Concern  . Not on file  Social History Narrative  . Not on file   Social Determinants of Health   Financial Resource Strain:   . Difficulty of Paying Living Expenses:   Food Insecurity:   . Worried About Charity fundraiser in the Last Year:   . Arboriculturist in the Last Year:   Transportation Needs:   . Film/video editor (Medical):   Marland Kitchen Lack of Transportation (Non-Medical):   Physical Activity:   . Days of Exercise per Week:   . Minutes  of Exercise per Session:   Stress:   . Feeling of Stress :   Social Connections:   . Frequency of Communication with Friends and Family:   . Frequency of Social Gatherings with Friends and Family:   . Attends Religious Services:   . Active Member of Clubs or Organizations:   . Attends Archivist Meetings:   Marland Kitchen Marital Status:   Intimate Partner Violence:   . Fear of Current or Ex-Partner:   . Emotionally Abused:   Marland Kitchen Physically Abused:   . Sexually Abused:     Family History  Problem Relation Age of Onset  . Breast cancer Neg Hx       Review of systems complete and found to be negative unless listed above      PHYSICAL EXAM  General: Well developed, well nourished, in  no acute distress HEENT:  Normocephalic and atramatic Neck:  No JVD.  Lungs: On supplemental O2 via [place]. Clear bilaterally to auscultation and percussion. Heart: HRRR . Normal S1 and S2 without gallops or murmurs.  Abdomen: Bowel sounds are positive, abdomen soft and non-tender  Msk:  Back normal.  Normal strength and tone for age. Extremities: No clubbing, cyanosis or edema.   Neuro: Alert and oriented X 3. Psych:  Good affect, responds appropriately  Labs:   Lab Results  Component Value Date   WBC 10.0 03/22/2020   HGB 13.4 03/22/2020   HCT 40.0 03/22/2020   MCV 82.8 03/22/2020   PLT 337 03/22/2020    Recent Labs  Lab 03/22/20 1434  NA 135  K 4.7  CL 98  CO2 28  BUN 26*  CREATININE 1.16*  CALCIUM 9.5  PROT 7.4  BILITOT 1.6*  ALKPHOS 110  ALT 22  AST 22  GLUCOSE 94   No results found for: CKTOTAL, CKMB, CKMBINDEX, TROPONINI No results found for: CHOL No results found for: HDL No results found for: LDLCALC No results found for: TRIG No results found for: CHOLHDL No results found for: LDLDIRECT    Radiology: DG Chest 2 View  Result Date: 03/22/2020 CLINICAL DATA:  86 year old female with shortness of breath. EXAM: CHEST - 2 VIEW COMPARISON:  Chest radiograph dated 01/19/2016 FINDINGS: Small bilateral pleural effusions, right greater left. There are bibasilar densities which may represent atelectasis or infiltrate. There is background of chronic interstitial coarsening and bronchitic changes. No pneumothorax. Stable cardiac silhouette. Atherosclerotic calcification of the aorta. There is a moderate size hiatal hernia. Osteopenia with degenerative changes of the spine and old multilevel compression fractures and vertebroplasty. IMPRESSION: Small bilateral pleural effusions with bibasilar atelectasis versus infiltrate. Electronically Signed   By: Anner Crete M.D.   On: 03/22/2020 15:14   CT ANGIO CHEST PE W OR WO CONTRAST  Result Date: 03/23/2020 CLINICAL DATA:   Suspected pulmonary embolus. High probability. Shortness of breath, congestive heart failure, decreased oxygen saturation. EXAM: CT ANGIOGRAPHY CHEST WITH CONTRAST TECHNIQUE: Multidetector CT imaging of the chest was performed using the standard protocol during bolus administration of intravenous contrast. Multiplanar CT image reconstructions and MIPs were obtained to evaluate the vascular anatomy. CONTRAST:  74mL OMNIPAQUE IOHEXOL 350 MG/ML SOLN COMPARISON:  CT chest 07/23/2013.  Chest radiograph 03/22/2020 FINDINGS: Cardiovascular: End of the central and segmental pulmonary arteries. Extrinsic impression on the distal right lobar pulmonary artery causing partial effacement but the vessel remains patent. No intrinsic filling defects are demonstrated. Cardiac enlargement with prominence of the right heart. Reflux of contrast material into the IVC may indicate right  heart failure. Normal caliber thoracic aorta with diffuse calcification. Mediastinum/Nodes: Esophageal hiatal hernia. Mildly dilated esophagus possibly due to reflux disease. Masslike density in the right lower mediastinum likely represents the fluid-filled herniated stomach. The EG junction appears to be at the normal locations suggesting a paraesophageal hernia. Scattered lymph nodes are not pathologically enlarged. Lungs/Pleura: Prominent diffuse emphysematous changes throughout the lungs. Peripheral interstitial fibrosis. There is obstruction of the bronchus intermedius on the right, possibly indicating aspirated material, mucoid impaction, or an endobronchial lesion. Consider referral for bronchoscopy if clinically indicated. Mild atelectasis in the lung bases. Upper Abdomen: No acute abnormality. Musculoskeletal: Degenerative changes throughout the spine. Multiple thoracic vertebral compression deformities post kyphoplasty changes. Review of the MIP images confirms the above findings. IMPRESSION: 1. No evidence of acute pulmonary embolus. Extrinsic  impression on the distal right lobar pulmonary artery causing partial effacement but the vessel remains patent. 2. Cardiac enlargement with evidence of right heart failure. 3. Prominent diffuse emphysematous changes throughout the lungs. Peripheral interstitial fibrosis. 4. Obstruction of the bronchus intermedius on the right, possibly indicating aspirated material, mucoid impaction, or an endobronchial lesion. Consider referral for bronchoscopy if clinically indicated. 5. Masslike density in the right lower mediastinum likely represents the fluid-filled herniated stomach. 6. Esophageal hiatal hernia. With much of the stomach in the chest, possibly a paraesophageal hernia. 7. Multiple thoracic vertebral compression deformities post kyphoplasty changes. 8. Emphysema and aortic atherosclerosis. Aortic Atherosclerosis (ICD10-I70.0) and Emphysema (ICD10-J43.9). Electronically Signed   By: Lucienne Capers M.D.   On: 03/23/2020 00:38   ECHOCARDIOGRAM COMPLETE  Result Date: 03/23/2020    ECHOCARDIOGRAM REPORT   Patient Name:   Stephanie Collins Date of Exam: 03/22/2020 Medical Rec #:  [id number]   Height:       60.0 in Accession #:    [id number]  Weight:       115.0 lb Date of Birth:  Aug 19, 1932   BSA:          1.475 m Patient Age:    30 years    BP:           146/79 mmHg Patient Gender: F           HR:           81 bpm. Exam Location:  [facility] Procedure: 2D Echo, Cardiac Doppler and Color Doppler Indications:     R06.89 Acute respiratory insufficiency  History:         Patient has no prior history of Echocardiogram examinations.                  Risk Factors:Hypertension. Lung cancer.  Sonographer:     Wilford Sports Rodgers-Jones Referring Phys:  [place] Diagnosing Phys: Bartholome Bill MD IMPRESSIONS  1. Left ventricular ejection fraction, by estimation, is 70 to 75%. The left ventricle has hyperdynamic function. The left ventricle has no regional wall motion abnormalities. There is mild left ventricular hypertrophy. Left  ventricular diastolic parameters are consistent with Grade I diastolic dysfunction (impaired relaxation).  2. Right ventricular systolic function is normal. The right ventricular size is mildly enlarged. There is moderately elevated pulmonary artery systolic pressure.  3. Left atrial size was mildly dilated.  4. The mitral valve is degenerative. Mild mitral valve regurgitation.  5. The aortic valve is grossly normal. Aortic valve regurgitation is trivial. Mild aortic valve sclerosis is present, with no evidence of aortic valve stenosis. FINDINGS  Left Ventricle: Left ventricular ejection fraction, by estimation, is 70 to 75%. The left  ventricle has hyperdynamic function. The left ventricle has no regional wall motion abnormalities. The left ventricular internal cavity size was normal in size. There is mild left ventricular hypertrophy. Left ventricular diastolic parameters are consistent with Grade I diastolic dysfunction (impaired relaxation). Right Ventricle: The right ventricular size is mildly enlarged. No increase in right ventricular wall thickness. Right ventricular systolic function is normal. There is moderately elevated pulmonary artery systolic pressure. The tricuspid regurgitant velocity is 3.52 m/s, and with an assumed right atrial pressure of 10 mmHg, the estimated right ventricular systolic pressure is 16.1 mmHg. Left Atrium: Left atrial size was mildly dilated. Right Atrium: Right atrial size was normal in size. Pericardium: There is no evidence of pericardial effusion. Mitral Valve: The mitral valve is degenerative in appearance. There is moderate calcification of the mitral valve leaflet(s). Mild mitral valve regurgitation. Tricuspid Valve: The tricuspid valve is grossly normal. Tricuspid valve regurgitation is mild. Aortic Valve: The aortic valve is grossly normal. Aortic valve regurgitation is trivial. Mild aortic valve sclerosis is present, with no evidence of aortic valve stenosis. Pulmonic  Valve: The pulmonic valve was not well visualized. Pulmonic valve regurgitation is trivial. Aorta: The aortic root is normal in size and structure. IAS/Shunts: The interatrial septum was not well visualized.  LEFT VENTRICLE PLAX 2D LVIDd:         4.61 cm  Diastology LVIDs:         2.52 cm  LV e' lateral:   7.83 cm/s LV PW:         0.66 cm  LV E/e' lateral: 7.0 LV IVS:        0.69 cm  LV e' medial:    4.24 cm/s LVOT diam:     1.90 cm  LV E/e' medial:  13.0 LV SV:         43 LV SV Index:   29 LVOT Area:     2.84 cm  RIGHT VENTRICLE             IVC RV Basal diam:  4.10 cm     IVC diam: 2.40 cm RV S prime:     10.10 cm/s TAPSE (M-mode): 1.1 cm LEFT ATRIUM             Index       RIGHT ATRIUM           Index LA diam:        4.00 cm 2.71 cm/m  RA Area:     22.10 cm LA Vol (A2C):   48.1 ml 32.60 ml/m RA Volume:   72.20 ml  48.94 ml/m LA Vol (A4C):   59.7 ml 40.46 ml/m LA Biplane Vol: 58.6 ml 39.72 ml/m  AORTIC VALVE LVOT Vmax:   85.50 cm/s LVOT Vmean:  55.650 cm/s LVOT VTI:    0.153 m  AORTA Ao Root diam: 3.00 cm MITRAL VALVE                TRICUSPID VALVE MV Area (PHT): 2.69 cm     TR Peak grad:   49.6 mmHg MV Decel Time: 282 msec     TR Vmax:        352.00 cm/s MV E velocity: 55.20 cm/s MV A velocity: 100.00 cm/s  SHUNTS MV E/A ratio:  0.55         Systemic VTI:  0.15 m  Systemic Diam: 1.90 cm Bartholome Bill MD Electronically signed by Bartholome Bill MD Signature Date/Time: 03/23/2020/7:59:04 AM    Final     EKG: Normal sinus rhythm with PACs; no evidence of acute ischemia   ASSESSMENT AND PLAN:  1.  Acute CHF   -With no prior history  -Has diuresed 1.6 Liters since admission; continue Lasix 40mg  BID with close monitoring of renal function; creatinine on admission 1.16 - appears close to baseline   -Echocardiogram this admission revealing preserved LV systolic function with an EF estimated between 70-75% with grade 1 diastolic dysfunction   -Discussed the importance of a low sodium  diet, daily weights at discharge   -Will follow clinical status and consider discharge tomorrow with close outpatient cardiology follow up   2.  Bilateral lower extremity edema   -Would agree to continue to hold amlodipine   3.  COPD   -Off of BiPAP, currently on [place]  -Would recommend weaning off of oxygen and ambulating today or tomorrow   4.  Hypertension   -Amlodipine being held due to lower extremity swelling   -Carvedilol and lisinopril added; will continue with this and follow   The history, physical exam findings, and plan of care were all discussed with Dr. Bartholome Bill and all decision making was made in collaboration.   Signed: Avie Arenas PA-C 03/23/2020, 8:41 AM

## 2020-03-23 NOTE — Progress Notes (Addendum)
Progress Note    DAWN CONVERY  ZOX:[id number] DOB: 1932-01-18  DOA: 03/22/2020 PCP: Baxter Hire, MD      Brief Narrative:    Medical records reviewed and are as summarized below:  DESIRAE MANCUSI is a 86 y.o. female with medical history significant for  COPD, ex smoker, history of lung cancer status post right lower lobectomy, hyperlipidemia, hypertension, osteoarthritis and osteoporosis.  She presented to the hospital with increasing shortness of breath and bilateral lower extremity edema for about 3 to 4 days duration.  She had gone to see her PCP in the office but she was referred to the emergency room because she was hypoxemic with oxygen saturation in the 80s.      Assessment/Plan:   Active Problems:   Acute CHF (congestive heart failure) (HCC)   Acute hypoxic respiratory failure secondary to CHF acute onset.  She is off of BiPAP.  Continue oxygen via nasal cannula and taper off as able. Continue IV Lasix and antihypertensives. Monitor BMP, urine output and fluid chart Patient has been evaluated by cardiologist.   COPD with exacerbation Continue prednisone and bronchodilators. Assess for home oxygen prior to discharge  Bilateral lower extremity edema Continue IV Lasix  Hypertension Continue antihypertensives  History of lung cancer status post right lobectomy In remission     Body mass index is 18.16 kg/m.  Diet Order            Diet Heart Room service appropriate? Yes; Fluid consistency: Thin  Diet effective now                       Medications:   . carvedilol  3.125 mg Oral BID WC  . enoxaparin (LOVENOX) injection  30 mg Subcutaneous Q24H  . furosemide  40 mg Intravenous Q12H  . lisinopril  2.5 mg Oral Daily  . mometasone-formoterol  2 puff Inhalation BID  . predniSONE  40 mg Oral Q breakfast  . sodium chloride flush  3 mL Intravenous Once  . sodium chloride flush  3 mL Intravenous Q12H   Continuous Infusions: . sodium  chloride       Anti-infectives (From admission, onward)   None             Family Communication/Anticipated D/C date and plan/Code Status   DVT prophylaxis: enoxaparin (LOVENOX) injection 30 mg Start: 03/23/20 1000 SCDs Start: 03/22/20 1717     Code Status: Full Code  Family Communication: Plan discussed with Jenny Reichmann, daughter, at the bedside Disposition Plan:    Status is: Inpatient  Remains inpatient appropriate because:IV treatments appropriate due to intensity of illness or inability to take PO and Inpatient level of care appropriate due to severity of illness   Dispo: The patient is from: Home              Anticipated d/c is to: Home              Anticipated d/c date is: 1 day              Patient currently is not medically stable to d/c.           Subjective:   Overnight events noted.  C/o shortness of breath.  No chest pain.  Leg swelling is improving.    Objective:    Vitals:   03/23/20 0048 03/23/20 0131 03/23/20 0416 03/23/20 0824  BP: 137/76  124/67 127/74  Pulse: 75  72 77  Resp:  18  Temp:   (!) 97.5 F (36.4 C) 98.8 F (37.1 C)  TempSrc:   Oral Oral  SpO2: 99%  99% 98%  Weight:  42.2 kg    Height:  5' (1.524 m)     No data found.   Intake/Output Summary (Last 24 hours) at 03/23/2020 0940 Last data filed at 03/23/2020 0421 Gross per 24 hour  Intake --  Output 1600 ml  Net -1600 ml   Filed Weights   03/22/20 1424 03/23/20 0131  Weight: 52.2 kg 42.2 kg    Exam:  GEN: NAD SKIN: Multiple bruises on bilateral legs EYES: EOMI ENT: MMM CV: RRR PULM: bronchial breath sounds, bibasilar rales, ABD: soft, ND, NT, +BS CNS: AAO x 3, non focal EXT: Bilateral leg edema is improving.  No tenderness   Data Reviewed:   I have personally reviewed following labs and imaging studies:  Labs: Labs show the following:   Basic Metabolic Panel: Recent Labs  Lab 03/22/20 1434  NA 135  K 4.7  CL 98  CO2 28  GLUCOSE 94  BUN  26*  CREATININE 1.16*  CALCIUM 9.5   GFR Estimated Creatinine Clearance: 23.2 mL/min (A) (by C-G formula based on SCr of 1.16 mg/dL (H)). Liver Function Tests: Recent Labs  Lab 03/22/20 1434  AST 22  ALT 22  ALKPHOS 110  BILITOT 1.6*  PROT 7.4  ALBUMIN 3.9   No results for input(s): LIPASE, AMYLASE in the last 168 hours. No results for input(s): AMMONIA in the last 168 hours. Coagulation profile No results for input(s): INR, PROTIME in the last 168 hours.  CBC: Recent Labs  Lab 03/22/20 1434  WBC 10.0  HGB 13.4  HCT 40.0  MCV 82.8  PLT 337   Cardiac Enzymes: No results for input(s): CKTOTAL, CKMB, CKMBINDEX, TROPONINI in the last 168 hours. BNP (last 3 results) No results for input(s): PROBNP in the last 8760 hours. CBG: Recent Labs  Lab 03/23/20 0826  GLUCAP 114*   D-Dimer: No results for input(s): DDIMER in the last 72 hours. Hgb A1c: No results for input(s): HGBA1C in the last 72 hours. Lipid Profile: No results for input(s): CHOL, HDL, LDLCALC, TRIG, CHOLHDL, LDLDIRECT in the last 72 hours. Thyroid function studies: No results for input(s): TSH, T4TOTAL, T3FREE, THYROIDAB in the last 72 hours.  Invalid input(s): FREET3 Anemia work up: No results for input(s): VITAMINB12, FOLATE, FERRITIN, TIBC, IRON, RETICCTPCT in the last 72 hours. Sepsis Labs: Recent Labs  Lab 03/22/20 1434 03/22/20 2009  PROCALCITON  --  <0.10  WBC 10.0  --     Microbiology Recent Results (from the past 240 hour(s))  SARS Coronavirus 2 by RT PCR (hospital order, performed in [facility] hospital lab) Nasopharyngeal Nasopharyngeal Swab     Status: None   Collection Time: 03/22/20  3:12 PM   Specimen: Nasopharyngeal Swab  Result Value Ref Range Status   SARS Coronavirus 2 NEGATIVE NEGATIVE Final    Comment: (NOTE) SARS-CoV-2 target nucleic acids are NOT DETECTED.  The SARS-CoV-2 RNA is generally detectable in upper and lower respiratory specimens during the acute phase of  infection. The lowest concentration of SARS-CoV-2 viral copies this assay can detect is 250 copies / mL. A negative result does not preclude SARS-CoV-2 infection and should not be used as the sole basis for treatment or other patient management decisions.  A negative result may occur with improper specimen collection / handling, submission of specimen other than nasopharyngeal swab, presence of viral mutation(s)  within the areas targeted by this assay, and inadequate number of viral copies (<250 copies / mL). A negative result must be combined with clinical observations, patient history, and epidemiological information.  Fact Sheet for Patients:   StrictlyIdeas.no  Fact Sheet for Healthcare Providers: BankingDealers.co.za  This test is not yet approved or  cleared by the [place] FDA and has been authorized for detection and/or diagnosis of SARS-CoV-2 by FDA under an Emergency Use Authorization (EUA).  This EUA will remain in effect (meaning this test can be used) for the duration of the COVID-19 declaration under Section 564(b)(1) of the Act, 21 U.S.C. section 360bbb-3(b)(1), unless the authorization is terminated or revoked sooner.  Performed at [facility], [address], [place], [place] [address]     Procedures and diagnostic studies:  DG Chest 2 View  Result Date: 03/22/2020 CLINICAL DATA:  86 year old female with shortness of breath. EXAM: CHEST - 2 VIEW COMPARISON:  Chest radiograph dated 01/19/2016 FINDINGS: Small bilateral pleural effusions, right greater left. There are bibasilar densities which may represent atelectasis or infiltrate. There is background of chronic interstitial coarsening and bronchitic changes. No pneumothorax. Stable cardiac silhouette. Atherosclerotic calcification of the aorta. There is a moderate size hiatal hernia. Osteopenia with degenerative changes of the spine and old multilevel  compression fractures and vertebroplasty. IMPRESSION: Small bilateral pleural effusions with bibasilar atelectasis versus infiltrate. Electronically Signed   By: Anner Crete M.D.   On: 03/22/2020 15:14   CT ANGIO CHEST PE W OR WO CONTRAST  Result Date: 03/23/2020 CLINICAL DATA:  Suspected pulmonary embolus. High probability. Shortness of breath, congestive heart failure, decreased oxygen saturation. EXAM: CT ANGIOGRAPHY CHEST WITH CONTRAST TECHNIQUE: Multidetector CT imaging of the chest was performed using the standard protocol during bolus administration of intravenous contrast. Multiplanar CT image reconstructions and MIPs were obtained to evaluate the vascular anatomy. CONTRAST:  62mL OMNIPAQUE IOHEXOL 350 MG/ML SOLN COMPARISON:  CT chest 07/23/2013.  Chest radiograph 03/22/2020 FINDINGS: Cardiovascular: End of the central and segmental pulmonary arteries. Extrinsic impression on the distal right lobar pulmonary artery causing partial effacement but the vessel remains patent. No intrinsic filling defects are demonstrated. Cardiac enlargement with prominence of the right heart. Reflux of contrast material into the IVC may indicate right heart failure. Normal caliber thoracic aorta with diffuse calcification. Mediastinum/Nodes: Esophageal hiatal hernia. Mildly dilated esophagus possibly due to reflux disease. Masslike density in the right lower mediastinum likely represents the fluid-filled herniated stomach. The EG junction appears to be at the normal locations suggesting a paraesophageal hernia. Scattered lymph nodes are not pathologically enlarged. Lungs/Pleura: Prominent diffuse emphysematous changes throughout the lungs. Peripheral interstitial fibrosis. There is obstruction of the bronchus intermedius on the right, possibly indicating aspirated material, mucoid impaction, or an endobronchial lesion. Consider referral for bronchoscopy if clinically indicated. Mild atelectasis in the lung bases. Upper  Abdomen: No acute abnormality. Musculoskeletal: Degenerative changes throughout the spine. Multiple thoracic vertebral compression deformities post kyphoplasty changes. Review of the MIP images confirms the above findings. IMPRESSION: 1. No evidence of acute pulmonary embolus. Extrinsic impression on the distal right lobar pulmonary artery causing partial effacement but the vessel remains patent. 2. Cardiac enlargement with evidence of right heart failure. 3. Prominent diffuse emphysematous changes throughout the lungs. Peripheral interstitial fibrosis. 4. Obstruction of the bronchus intermedius on the right, possibly indicating aspirated material, mucoid impaction, or an endobronchial lesion. Consider referral for bronchoscopy if clinically indicated. 5. Masslike density in the right lower mediastinum likely represents the fluid-filled herniated  stomach. 6. Esophageal hiatal hernia. With much of the stomach in the chest, possibly a paraesophageal hernia. 7. Multiple thoracic vertebral compression deformities post kyphoplasty changes. 8. Emphysema and aortic atherosclerosis. Aortic Atherosclerosis (ICD10-I70.0) and Emphysema (ICD10-J43.9). Electronically Signed   By: Lucienne Capers M.D.   On: 03/23/2020 00:38   ECHOCARDIOGRAM COMPLETE  Result Date: 03/23/2020    ECHOCARDIOGRAM REPORT   Patient Name:   JONEISHA MILES Date of Exam: 03/22/2020 Medical Rec #:  [id number]   Height:       60.0 in Accession #:    [id number]  Weight:       115.0 lb Date of Birth:  Apr 13, 1932   BSA:          1.475 m Patient Age:    44 years    BP:           146/79 mmHg Patient Gender: F           HR:           81 bpm. Exam Location:  [facility] Procedure: 2D Echo, Cardiac Doppler and Color Doppler Indications:     R06.89 Acute respiratory insufficiency  History:         Patient has no prior history of Echocardiogram examinations.                  Risk Factors:Hypertension. Lung cancer.  Sonographer:     Wilford Sports Rodgers-Jones Referring Phys:  [place] Diagnosing Phys: Bartholome Bill MD IMPRESSIONS  1. Left ventricular ejection fraction, by estimation, is 70 to 75%. The left ventricle has hyperdynamic function. The left ventricle has no regional wall motion abnormalities. There is mild left ventricular hypertrophy. Left ventricular diastolic parameters are consistent with Grade I diastolic dysfunction (impaired relaxation).  2. Right ventricular systolic function is normal. The right ventricular size is mildly enlarged. There is moderately elevated pulmonary artery systolic pressure.  3. Left atrial size was mildly dilated.  4. The mitral valve is degenerative. Mild mitral valve regurgitation.  5. The aortic valve is grossly normal. Aortic valve regurgitation is trivial. Mild aortic valve sclerosis is present, with no evidence of aortic valve stenosis. FINDINGS  Left Ventricle: Left ventricular ejection fraction, by estimation, is 70 to 75%. The left ventricle has hyperdynamic function. The left ventricle has no regional wall motion abnormalities. The left ventricular internal cavity size was normal in size. There is mild left ventricular hypertrophy. Left ventricular diastolic parameters are consistent with Grade I diastolic dysfunction (impaired relaxation). Right Ventricle: The right ventricular size is mildly enlarged. No increase in right ventricular wall thickness. Right ventricular systolic function is normal. There is moderately elevated pulmonary artery systolic pressure. The tricuspid regurgitant velocity is 3.52 m/s, and with an assumed right atrial pressure of 10 mmHg, the estimated right ventricular systolic pressure is 25.4 mmHg. Left Atrium: Left atrial size was mildly dilated. Right Atrium: Right atrial size was normal in size. Pericardium: There is no evidence of pericardial effusion. Mitral Valve: The mitral valve is degenerative in appearance. There is moderate calcification of the mitral valve leaflet(s). Mild mitral valve  regurgitation. Tricuspid Valve: The tricuspid valve is grossly normal. Tricuspid valve regurgitation is mild. Aortic Valve: The aortic valve is grossly normal. Aortic valve regurgitation is trivial. Mild aortic valve sclerosis is present, with no evidence of aortic valve stenosis. Pulmonic Valve: The pulmonic valve was not well visualized. Pulmonic valve regurgitation is trivial. Aorta: The aortic root is normal in size and structure. IAS/Shunts: The interatrial  septum was not well visualized.  LEFT VENTRICLE PLAX 2D LVIDd:         4.61 cm  Diastology LVIDs:         2.52 cm  LV e' lateral:   7.83 cm/s LV PW:         0.66 cm  LV E/e' lateral: 7.0 LV IVS:        0.69 cm  LV e' medial:    4.24 cm/s LVOT diam:     1.90 cm  LV E/e' medial:  13.0 LV SV:         43 LV SV Index:   29 LVOT Area:     2.84 cm  RIGHT VENTRICLE             IVC RV Basal diam:  4.10 cm     IVC diam: 2.40 cm RV S prime:     10.10 cm/s TAPSE (M-mode): 1.1 cm LEFT ATRIUM             Index       RIGHT ATRIUM           Index LA diam:        4.00 cm 2.71 cm/m  RA Area:     22.10 cm LA Vol (A2C):   48.1 ml 32.60 ml/m RA Volume:   72.20 ml  48.94 ml/m LA Vol (A4C):   59.7 ml 40.46 ml/m LA Biplane Vol: 58.6 ml 39.72 ml/m  AORTIC VALVE LVOT Vmax:   85.50 cm/s LVOT Vmean:  55.650 cm/s LVOT VTI:    0.153 m  AORTA Ao Root diam: 3.00 cm MITRAL VALVE                TRICUSPID VALVE MV Area (PHT): 2.69 cm     TR Peak grad:   49.6 mmHg MV Decel Time: 282 msec     TR Vmax:        352.00 cm/s MV E velocity: 55.20 cm/s MV A velocity: 100.00 cm/s  SHUNTS MV E/A ratio:  0.55         Systemic VTI:  0.15 m                             Systemic Diam: 1.90 cm Bartholome Bill MD Electronically signed by Bartholome Bill MD Signature Date/Time: 03/23/2020/7:59:04 AM    Final                LOS: 1 day   Ladanian Kelter  [facility]     03/23/2020, 9:40 AM

## 2020-03-24 LAB — BASIC METABOLIC PANEL
Anion gap: 10 (ref 5–15)
BUN: 26 mg/dL — ABNORMAL HIGH (ref 8–23)
CO2: 31 mmol/L (ref 22–32)
Calcium: 8.6 mg/dL — ABNORMAL LOW (ref 8.9–10.3)
Chloride: 94 mmol/L — ABNORMAL LOW (ref 98–111)
Creatinine, Ser: 1.32 mg/dL — ABNORMAL HIGH (ref 0.44–1.00)
GFR calc Af Amer: 42 mL/min — ABNORMAL LOW (ref >60)
GFR calc non Af Amer: 36 mL/min — ABNORMAL LOW (ref >60)
Glucose, Bld: 109 mg/dL — ABNORMAL HIGH (ref 70–99)
Potassium: 3.2 mmol/L — ABNORMAL LOW (ref 3.5–5.1)
Sodium: 135 mmol/L (ref 135–145)

## 2020-03-24 LAB — MAGNESIUM: Magnesium: 2.2 mg/dL (ref 1.7–2.4)

## 2020-03-24 LAB — BRAIN NATRIURETIC PEPTIDE: B Natriuretic Peptide: 1570.9 pg/mL — ABNORMAL HIGH (ref 0.0–100.0)

## 2020-03-24 MED ORDER — ZOLPIDEM TARTRATE 5 MG PO TABS: 5.0000 mg | ORAL_TABLET | Freq: Every evening | ORAL | Status: DC | PRN

## 2020-03-24 MED ORDER — POTASSIUM CHLORIDE CRYS ER 20 MEQ PO TBCR
40.0000 meq | EXTENDED_RELEASE_TABLET | Freq: Once | ORAL | Status: AC
  Administered 2020-03-24: 40 meq via ORAL
  Filled 2020-03-24: qty 2

## 2020-03-24 NOTE — Progress Notes (Signed)
[facility] Cardiology    SUBJECTIVE:  Stephanie Collins is an 86 year old female with a past medical history significant for lung cancer s/p right lower lobectomy, non-oxygen dependent COPD, former tobacco abuse, hyperlipidemia, and hypertension who presented to the ED from Dr. Tillman Sers office for a few day history of increased shortness of breath and lower extremity swelling.  Workup in the ED was significant for O2 stats in the 80s on room air, BNP of 1976, chest xray revealing small bilateral pleural effusions, chest CT revealing no evidence of a PE with evidence of right heart failure, high sensitivity troponin 16 and 18 respectively, and ECG revealing normal sinus rhythm with no obvious acute ischemic changes.  She was started on BiPAP and has since been transitioned to [facility].   03/23/20: Stephanie Collins reports significant improvement in shortness of breath and lower extremity swelling since arrival.  She is currently sitting up in bed, eating breakfast, in no acute distress.  She denies chest pain, chest pressure, palpitations, or heart racing sensation.  She denies orthopnea or PND.   03/24/20: Stephanie Collins continues to improve and reports back to her baseline.  She denies shortness of breath, lower extremity swelling, orthopnea, or PND.  She was on 2L of supplemental O2 via [place] yesterday and is back up to 4 - unclear why.    Vitals:   03/23/20 1703 03/23/20 1945 03/24/20 0405 03/24/20 0743  BP: 124/67 123/68 110/71 126/72  Pulse: 76 69 (!) 57 64  Resp: 18 20 20 19   Temp:  (!) 97.3 F (36.3 C) 97.6 F (36.4 C)   TempSrc:  Oral Oral   SpO2: 91% 94% 95% 95%  Weight:   47 kg   Height:         Intake/Output Summary (Last 24 hours) at 03/24/2020 5027 Last data filed at 03/24/2020 0744 Gross per 24 hour  Intake --  Output 3200 ml  Net -3200 ml      PHYSICAL EXAM  General: Well developed, well nourished, in no acute distress HEENT:  Normocephalic and atramatic Neck:  No JVD.  Lungs: Clear bilaterally to  auscultation and percussion. Heart: HRRR . Normal S1 and S2 without gallops or murmurs.  Abdomen: Bowel sounds are positive, abdomen soft and non-tender  Msk:  Back normal, normal gait. Normal strength and tone for age. Extremities: No clubbing, cyanosis or edema.   Neuro: Alert and oriented X 3. Psych:  Good affect, responds appropriately   LABS: Basic Metabolic Panel: Recent Labs    03/22/20 1434  NA 135  K 4.7  CL 98  CO2 28  GLUCOSE 94  BUN 26*  CREATININE 1.16*  CALCIUM 9.5   Liver Function Tests: Recent Labs    03/22/20 1434  AST 22  ALT 22  ALKPHOS 110  BILITOT 1.6*  PROT 7.4  ALBUMIN 3.9   No results for input(s): LIPASE, AMYLASE in the last 72 hours. CBC: Recent Labs    03/22/20 1434  WBC 10.0  HGB 13.4  HCT 40.0  MCV 82.8  PLT 337   Cardiac Enzymes: No results for input(s): CKTOTAL, CKMB, CKMBINDEX, TROPONINI in the last 72 hours. BNP: Invalid input(s): POCBNP D-Dimer: No results for input(s): DDIMER in the last 72 hours. Hemoglobin A1C: No results for input(s): HGBA1C in the last 72 hours. Fasting Lipid Panel: No results for input(s): CHOL, HDL, LDLCALC, TRIG, CHOLHDL, LDLDIRECT in the last 72 hours. Thyroid Function Tests: No results for input(s): TSH, T4TOTAL, T3FREE, THYROIDAB in the last  72 hours.  Invalid input(s): FREET3 Anemia Panel: No results for input(s): VITAMINB12, FOLATE, FERRITIN, TIBC, IRON, RETICCTPCT in the last 72 hours.  DG Chest 2 View  Result Date: 03/22/2020 CLINICAL DATA:  86 year old female with shortness of breath. EXAM: CHEST - 2 VIEW COMPARISON:  Chest radiograph dated 01/19/2016 FINDINGS: Small bilateral pleural effusions, right greater left. There are bibasilar densities which may represent atelectasis or infiltrate. There is background of chronic interstitial coarsening and bronchitic changes. No pneumothorax. Stable cardiac silhouette. Atherosclerotic calcification of the aorta. There is a moderate size hiatal  hernia. Osteopenia with degenerative changes of the spine and old multilevel compression fractures and vertebroplasty. IMPRESSION: Small bilateral pleural effusions with bibasilar atelectasis versus infiltrate. Electronically Signed   By: Anner Crete M.D.   On: 03/22/2020 15:14   CT ANGIO CHEST PE W OR WO CONTRAST  Result Date: 03/23/2020 CLINICAL DATA:  Suspected pulmonary embolus. High probability. Shortness of breath, congestive heart failure, decreased oxygen saturation. EXAM: CT ANGIOGRAPHY CHEST WITH CONTRAST TECHNIQUE: Multidetector CT imaging of the chest was performed using the standard protocol during bolus administration of intravenous contrast. Multiplanar CT image reconstructions and MIPs were obtained to evaluate the vascular anatomy. CONTRAST:  70mL OMNIPAQUE IOHEXOL 350 MG/ML SOLN COMPARISON:  CT chest 07/23/2013.  Chest radiograph 03/22/2020 FINDINGS: Cardiovascular: End of the central and segmental pulmonary arteries. Extrinsic impression on the distal right lobar pulmonary artery causing partial effacement but the vessel remains patent. No intrinsic filling defects are demonstrated. Cardiac enlargement with prominence of the right heart. Reflux of contrast material into the IVC may indicate right heart failure. Normal caliber thoracic aorta with diffuse calcification. Mediastinum/Nodes: Esophageal hiatal hernia. Mildly dilated esophagus possibly due to reflux disease. Masslike density in the right lower mediastinum likely represents the fluid-filled herniated stomach. The EG junction appears to be at the normal locations suggesting a paraesophageal hernia. Scattered lymph nodes are not pathologically enlarged. Lungs/Pleura: Prominent diffuse emphysematous changes throughout the lungs. Peripheral interstitial fibrosis. There is obstruction of the bronchus intermedius on the right, possibly indicating aspirated material, mucoid impaction, or an endobronchial lesion. Consider referral for  bronchoscopy if clinically indicated. Mild atelectasis in the lung bases. Upper Abdomen: No acute abnormality. Musculoskeletal: Degenerative changes throughout the spine. Multiple thoracic vertebral compression deformities post kyphoplasty changes. Review of the MIP images confirms the above findings. IMPRESSION: 1. No evidence of acute pulmonary embolus. Extrinsic impression on the distal right lobar pulmonary artery causing partial effacement but the vessel remains patent. 2. Cardiac enlargement with evidence of right heart failure. 3. Prominent diffuse emphysematous changes throughout the lungs. Peripheral interstitial fibrosis. 4. Obstruction of the bronchus intermedius on the right, possibly indicating aspirated material, mucoid impaction, or an endobronchial lesion. Consider referral for bronchoscopy if clinically indicated. 5. Masslike density in the right lower mediastinum likely represents the fluid-filled herniated stomach. 6. Esophageal hiatal hernia. With much of the stomach in the chest, possibly a paraesophageal hernia. 7. Multiple thoracic vertebral compression deformities post kyphoplasty changes. 8. Emphysema and aortic atherosclerosis. Aortic Atherosclerosis (ICD10-I70.0) and Emphysema (ICD10-J43.9). Electronically Signed   By: Lucienne Capers M.D.   On: 03/23/2020 00:38   ECHOCARDIOGRAM COMPLETE  Result Date: 03/23/2020    ECHOCARDIOGRAM REPORT   Patient Name:   Stephanie Collins Date of Exam: 03/22/2020 Medical Rec #:  [id number]   Height:       60.0 in Accession #:    [id number]  Weight:       115.0 lb Date of Birth:  1931-10-21  BSA:          1.475 m Patient Age:    25 years    BP:           146/79 mmHg Patient Gender: F           HR:           81 bpm. Exam Location:  [facility] Procedure: 2D Echo, Cardiac Doppler and Color Doppler Indications:     R06.89 Acute respiratory insufficiency  History:         Patient has no prior history of Echocardiogram examinations.                  Risk  Factors:Hypertension. Lung cancer.  Sonographer:     Wilford Sports Rodgers-Jones Referring Phys:  [place] Diagnosing Phys: Bartholome Bill MD IMPRESSIONS  1. Left ventricular ejection fraction, by estimation, is 70 to 75%. The left ventricle has hyperdynamic function. The left ventricle has no regional wall motion abnormalities. There is mild left ventricular hypertrophy. Left ventricular diastolic parameters are consistent with Grade I diastolic dysfunction (impaired relaxation).  2. Right ventricular systolic function is normal. The right ventricular size is mildly enlarged. There is moderately elevated pulmonary artery systolic pressure.  3. Left atrial size was mildly dilated.  4. The mitral valve is degenerative. Mild mitral valve regurgitation.  5. The aortic valve is grossly normal. Aortic valve regurgitation is trivial. Mild aortic valve sclerosis is present, with no evidence of aortic valve stenosis. FINDINGS  Left Ventricle: Left ventricular ejection fraction, by estimation, is 70 to 75%. The left ventricle has hyperdynamic function. The left ventricle has no regional wall motion abnormalities. The left ventricular internal cavity size was normal in size. There is mild left ventricular hypertrophy. Left ventricular diastolic parameters are consistent with Grade I diastolic dysfunction (impaired relaxation). Right Ventricle: The right ventricular size is mildly enlarged. No increase in right ventricular wall thickness. Right ventricular systolic function is normal. There is moderately elevated pulmonary artery systolic pressure. The tricuspid regurgitant velocity is 3.52 m/s, and with an assumed right atrial pressure of 10 mmHg, the estimated right ventricular systolic pressure is 79.8 mmHg. Left Atrium: Left atrial size was mildly dilated. Right Atrium: Right atrial size was normal in size. Pericardium: There is no evidence of pericardial effusion. Mitral Valve: The mitral valve is degenerative in  appearance. There is moderate calcification of the mitral valve leaflet(s). Mild mitral valve regurgitation. Tricuspid Valve: The tricuspid valve is grossly normal. Tricuspid valve regurgitation is mild. Aortic Valve: The aortic valve is grossly normal. Aortic valve regurgitation is trivial. Mild aortic valve sclerosis is present, with no evidence of aortic valve stenosis. Pulmonic Valve: The pulmonic valve was not well visualized. Pulmonic valve regurgitation is trivial. Aorta: The aortic root is normal in size and structure. IAS/Shunts: The interatrial septum was not well visualized.  LEFT VENTRICLE PLAX 2D LVIDd:         4.61 cm  Diastology LVIDs:         2.52 cm  LV e' lateral:   7.83 cm/s LV PW:         0.66 cm  LV E/e' lateral: 7.0 LV IVS:        0.69 cm  LV e' medial:    4.24 cm/s LVOT diam:     1.90 cm  LV E/e' medial:  13.0 LV SV:         43 LV SV Index:   29 LVOT Area:  2.84 cm  RIGHT VENTRICLE             IVC RV Basal diam:  4.10 cm     IVC diam: 2.40 cm RV S prime:     10.10 cm/s TAPSE (M-mode): 1.1 cm LEFT ATRIUM             Index       RIGHT ATRIUM           Index LA diam:        4.00 cm 2.71 cm/m  RA Area:     22.10 cm LA Vol (A2C):   48.1 ml 32.60 ml/m RA Volume:   72.20 ml  48.94 ml/m LA Vol (A4C):   59.7 ml 40.46 ml/m LA Biplane Vol: 58.6 ml 39.72 ml/m  AORTIC VALVE LVOT Vmax:   85.50 cm/s LVOT Vmean:  55.650 cm/s LVOT VTI:    0.153 m  AORTA Ao Root diam: 3.00 cm MITRAL VALVE                TRICUSPID VALVE MV Area (PHT): 2.69 cm     TR Peak grad:   49.6 mmHg MV Decel Time: 282 msec     TR Vmax:        352.00 cm/s MV E velocity: 55.20 cm/s MV A velocity: 100.00 cm/s  SHUNTS MV E/A ratio:  0.55         Systemic VTI:  0.15 m                             Systemic Diam: 1.90 cm Bartholome Bill MD Electronically signed by Bartholome Bill MD Signature Date/Time: 03/23/2020/7:59:04 AM    Final      Echo:  Normal RV and LV systolic function with an EF estimated between 70-75% with mild LVH, grade 1  diastolic dysfunction. Mild MR. Mild aortic valve sclerosis with no evidence of stenosis.   TELEMETRY:  Normal sinus rhythm   ASSESSMENT AND PLAN: 1.  Acute CHF              -With no prior history             -Has diuresed 4.8 Liters since admission; appears back to her baseline weight  -Would recommend reducing Lasix to 20mg  BID with further adjustments in an outpatient setting              -Echocardiogram this admission revealing preserved LV systolic function with an EF estimated between 70-75% with grade 1 diastolic dysfunction              -Discussed the importance of a low sodium diet, daily weights at discharge              -Back up to 4L [place], unsure why - would recommend reducing and weaning off of O2; would recommend discharge following this   2.  Bilateral lower extremity edema              -Would agree to continue to hold amlodipine   3.  COPD              -Off of BiPAP, currently on              -Would recommend weaning off of oxygen and ambulating  4.  Hypertension              -Amlodipine being held due to lower extremity swelling              -  Carvedilol and lisinopril added; will continue with this and follow   Active Problems:   Acute CHF (congestive heart failure) ([place])    The history, physical exam findings, and plan of care were all discussed with Dr. Bartholome Bill, and all decision making was made in collaboration.   Avie Arenas  PA-C  03/24/2020 8:23 AM

## 2020-03-24 NOTE — Progress Notes (Addendum)
Progress Note    [name]  WCH:[id number] DOB: [birth year]  DOA: 03/22/2020 PCP: Baxter Hire, MD      Brief Narrative:    Medical records reviewed and are as summarized below:  Stephanie Collins is a 86 y.o. female with medical history significant for  COPD, ex smoker, history of lung cancer status post right lower lobectomy, hyperlipidemia, hypertension, osteoarthritis and osteoporosis.  She presented to the hospital with increasing shortness of breath and bilateral lower extremity edema for about 3 to 4 days duration.  She had gone to see her PCP in the office but she was referred to the emergency room because she was hypoxemic with oxygen saturation in the 80s.      Assessment/Plan:   Active Problems:   Acute CHF (congestive heart failure) (HCC)   Acute hypoxic respiratory failure secondary to CHF acute onset.  Oxygen requirement went up from 2 L/min to 4 L/min.  Continue oxygen and taper off as able.  She may need home oxygen at discharge. She had IV Lasix this morning.  Discontinue IV Lasix because she appears euvolemic and creatinine is trending up. Monitor BMP, urine output and fluid chart Follow-up with cardiologist.  2D echo showed EF estimated at 70 to 35%, grade 1 diastolic dysfunction.  COPD with exacerbation Continue prednisone and bronchodilators. Assess for home oxygen prior to discharge  Bilateral lower extremity edema Improved.  Discontinue IV Lasix.  Hypertension Continue antihypertensives  Hypokalemia Replete potassium and monitor levels  CKD stage IIIb Creatinine is trending up.  Continue to monitor.  History of lung cancer status post right lobectomy In remission  Insomnia She says she does not like trazodone because of the way it makes her feel (confused/agitated).  She's willing to try Ambien   Body mass index is 20.25 kg/m.  Diet Order            Diet Heart Room service appropriate? Yes; Fluid consistency: Thin  Diet  effective now                       Medications:   . carvedilol  3.125 mg Oral BID WC  . enoxaparin (LOVENOX) injection  30 mg Subcutaneous Q24H  . lisinopril  2.5 mg Oral Daily  . mometasone-formoterol  2 puff Inhalation BID  . predniSONE  40 mg Oral Q breakfast  . sodium chloride flush  3 mL Intravenous Once  . sodium chloride flush  3 mL Intravenous Q12H   Continuous Infusions: . sodium chloride       Anti-infectives (From admission, onward)   None             Family Communication/Anticipated D/C date and plan/Code Status   DVT prophylaxis: enoxaparin (LOVENOX) injection 30 mg Start: 03/23/20 1000 SCDs Start: 03/22/20 1717     Code Status: Full Code  Family Communication: Plan discussed with Jenny Reichmann, daughter, at the bedside Disposition Plan:    Status is: Inpatient  Remains inpatient appropriate because:IV treatments appropriate due to intensity of illness or inability to take PO and Inpatient level of care appropriate due to severity of illness   Dispo: The patient is from: Home              Anticipated d/c is to: Home              Anticipated d/c date is: 1 day              Patient  currently is not medically stable to d/c.           Subjective:   Overnight events noted.  She said she ambulated this morning but her oxygen saturations dropped.  Oxygen requirement increased from 2 L/min to 4 L/min.  No shortness of breath or chest pain.  She has an occasional cough.   Objective:    Vitals:   03/23/20 1945 03/24/20 0405 03/24/20 0743 03/24/20 1051  BP: 123/68 110/71 126/72 (!) 96/54  Pulse: 69 (!) 57 64 66  Resp: 20 20 19 18   Temp: (!) 97.3 F (36.3 C) 97.6 F (36.4 C)  98.2 F (36.8 C)  TempSrc: Oral Oral  Oral  SpO2: 94% 95% 95% 93%  Weight:  47 kg    Height:       No data found.   Intake/Output Summary (Last 24 hours) at 03/24/2020 1157 Last data filed at 03/24/2020 1015 Gross per 24 hour  Intake 120 ml  Output 3200 ml   Net -3080 ml   Filed Weights   03/22/20 1424 03/23/20 0131 03/24/20 0405  Weight: 52.2 kg 42.2 kg 47 kg    Exam:  GEN: NAD SKIN: Multiple bruises on bilateral legs EYES: EOMI ENT: MMM CV: RRR PULM: Diffuse bilateral expiratory wheezing, mild bibasilar rales ABD: soft, ND, NT, +BS CNS: AAO x 3, non focal EXT: No edema or tenderness   Data Reviewed:   I have personally reviewed following labs and imaging studies:  Labs: Labs show the following:   Basic Metabolic Panel: Recent Labs  Lab 03/22/20 1434 03/24/20 0939  NA 135 135  K 4.7 3.2*  CL 98 94*  CO2 28 31  GLUCOSE 94 109*  BUN 26* 26*  CREATININE 1.16* 1.32*  CALCIUM 9.5 8.6*  MG  --  2.2   GFR Estimated Creatinine Clearance: 22 mL/min (A) (by C-G formula based on SCr of 1.32 mg/dL (H)). Liver Function Tests: Recent Labs  Lab 03/22/20 1434  AST 22  ALT 22  ALKPHOS 110  BILITOT 1.6*  PROT 7.4  ALBUMIN 3.9   No results for input(s): LIPASE, AMYLASE in the last 168 hours. No results for input(s): AMMONIA in the last 168 hours. Coagulation profile No results for input(s): INR, PROTIME in the last 168 hours.  CBC: Recent Labs  Lab 03/22/20 1434  WBC 10.0  HGB 13.4  HCT 40.0  MCV 82.8  PLT 337   Cardiac Enzymes: No results for input(s): CKTOTAL, CKMB, CKMBINDEX, TROPONINI in the last 168 hours. BNP (last 3 results) No results for input(s): PROBNP in the last 8760 hours. CBG: Recent Labs  Lab 03/23/20 0826  GLUCAP 114*   D-Dimer: No results for input(s): DDIMER in the last 72 hours. Hgb A1c: No results for input(s): HGBA1C in the last 72 hours. Lipid Profile: No results for input(s): CHOL, HDL, LDLCALC, TRIG, CHOLHDL, LDLDIRECT in the last 72 hours. Thyroid function studies: No results for input(s): TSH, T4TOTAL, T3FREE, THYROIDAB in the last 72 hours.  Invalid input(s): FREET3 Anemia work up: No results for input(s): VITAMINB12, FOLATE, FERRITIN, TIBC, IRON, RETICCTPCT in the  last 72 hours. Sepsis Labs: Recent Labs  Lab 03/22/20 1434 03/22/20 2009  PROCALCITON  --  <0.10  WBC 10.0  --     Microbiology Recent Results (from the past 240 hour(s))  SARS Coronavirus 2 by RT PCR (hospital order, performed in [facility] hospital lab) Nasopharyngeal Nasopharyngeal Swab     Status: None   Collection Time: 03/22/20  3:12 PM   Specimen: Nasopharyngeal Swab  Result Value Ref Range Status   SARS Coronavirus 2 NEGATIVE NEGATIVE Final    Comment: (NOTE) SARS-CoV-2 target nucleic acids are NOT DETECTED.  The SARS-CoV-2 RNA is generally detectable in upper and lower respiratory specimens during the acute phase of infection. The lowest concentration of SARS-CoV-2 viral copies this assay can detect is 250 copies / mL. A negative result does not preclude SARS-CoV-2 infection and should not be used as the sole basis for treatment or other patient management decisions.  A negative result may occur with improper specimen collection / handling, submission of specimen other than nasopharyngeal swab, presence of viral mutation(s) within the areas targeted by this assay, and inadequate number of viral copies (<250 copies / mL). A negative result must be combined with clinical observations, patient history, and epidemiological information.  Fact Sheet for Patients:   StrictlyIdeas.no  Fact Sheet for Healthcare Providers: BankingDealers.co.za  This test is not yet approved or  cleared by the [place] FDA and has been authorized for detection and/or diagnosis of SARS-CoV-2 by FDA under an Emergency Use Authorization (EUA).  This EUA will remain in effect (meaning this test can be used) for the duration of the COVID-19 declaration under Section 564(b)(1) of the Act, 21 U.S.C. section 360bbb-3(b)(1), unless the authorization is terminated or revoked sooner.  Performed at [facility], [address],  [place], [place] [address]     Procedures and diagnostic studies:  DG Chest 2 View  Result Date: 03/22/2020 CLINICAL DATA:  86 year old female with shortness of breath. EXAM: CHEST - 2 VIEW COMPARISON:  Chest radiograph dated 01/19/2016 FINDINGS: Small bilateral pleural effusions, right greater left. There are bibasilar densities which may represent atelectasis or infiltrate. There is background of chronic interstitial coarsening and bronchitic changes. No pneumothorax. Stable cardiac silhouette. Atherosclerotic calcification of the aorta. There is a moderate size hiatal hernia. Osteopenia with degenerative changes of the spine and old multilevel compression fractures and vertebroplasty. IMPRESSION: Small bilateral pleural effusions with bibasilar atelectasis versus infiltrate. Electronically Signed   By: Anner Crete M.D.   On: 03/22/2020 15:14   CT ANGIO CHEST PE W OR WO CONTRAST  Result Date: 03/23/2020 CLINICAL DATA:  Suspected pulmonary embolus. High probability. Shortness of breath, congestive heart failure, decreased oxygen saturation. EXAM: CT ANGIOGRAPHY CHEST WITH CONTRAST TECHNIQUE: Multidetector CT imaging of the chest was performed using the standard protocol during bolus administration of intravenous contrast. Multiplanar CT image reconstructions and MIPs were obtained to evaluate the vascular anatomy. CONTRAST:  37mL OMNIPAQUE IOHEXOL 350 MG/ML SOLN COMPARISON:  CT chest 07/23/2013.  Chest radiograph 03/22/2020 FINDINGS: Cardiovascular: End of the central and segmental pulmonary arteries. Extrinsic impression on the distal right lobar pulmonary artery causing partial effacement but the vessel remains patent. No intrinsic filling defects are demonstrated. Cardiac enlargement with prominence of the right heart. Reflux of contrast material into the IVC may indicate right heart failure. Normal caliber thoracic aorta with diffuse calcification. Mediastinum/Nodes: Esophageal hiatal hernia. Mildly  dilated esophagus possibly due to reflux disease. Masslike density in the right lower mediastinum likely represents the fluid-filled herniated stomach. The EG junction appears to be at the normal locations suggesting a paraesophageal hernia. Scattered lymph nodes are not pathologically enlarged. Lungs/Pleura: Prominent diffuse emphysematous changes throughout the lungs. Peripheral interstitial fibrosis. There is obstruction of the bronchus intermedius on the right, possibly indicating aspirated material, mucoid impaction, or an endobronchial lesion. Consider referral for bronchoscopy if clinically indicated. Mild atelectasis in  the lung bases. Upper Abdomen: No acute abnormality. Musculoskeletal: Degenerative changes throughout the spine. Multiple thoracic vertebral compression deformities post kyphoplasty changes. Review of the MIP images confirms the above findings. IMPRESSION: 1. No evidence of acute pulmonary embolus. Extrinsic impression on the distal right lobar pulmonary artery causing partial effacement but the vessel remains patent. 2. Cardiac enlargement with evidence of right heart failure. 3. Prominent diffuse emphysematous changes throughout the lungs. Peripheral interstitial fibrosis. 4. Obstruction of the bronchus intermedius on the right, possibly indicating aspirated material, mucoid impaction, or an endobronchial lesion. Consider referral for bronchoscopy if clinically indicated. 5. Masslike density in the right lower mediastinum likely represents the fluid-filled herniated stomach. 6. Esophageal hiatal hernia. With much of the stomach in the chest, possibly a paraesophageal hernia. 7. Multiple thoracic vertebral compression deformities post kyphoplasty changes. 8. Emphysema and aortic atherosclerosis. Aortic Atherosclerosis (ICD10-I70.0) and Emphysema (ICD10-J43.9). Electronically Signed   By: Lucienne Capers M.D.   On: 03/23/2020 00:38   ECHOCARDIOGRAM COMPLETE  Result Date: 03/23/2020     ECHOCARDIOGRAM REPORT   Patient Name:   Stephanie Collins Date of Exam: 03/22/2020 Medical Rec #:  [id number]   Height:       60.0 in Accession #:    [id number]  Weight:       115.0 lb Date of Birth:  May 10, 1932   BSA:          1.475 m Patient Age:    72 years    BP:           146/79 mmHg Patient Gender: F           HR:           81 bpm. Exam Location:  [facility] Procedure: 2D Echo, Cardiac Doppler and Color Doppler Indications:     R06.89 Acute respiratory insufficiency  History:         Patient has no prior history of Echocardiogram examinations.                  Risk Factors:Hypertension. Lung cancer.  Sonographer:     Wilford Sports Rodgers-Jones Referring Phys:  [place] Diagnosing Phys: Bartholome Bill MD IMPRESSIONS  1. Left ventricular ejection fraction, by estimation, is 70 to 75%. The left ventricle has hyperdynamic function. The left ventricle has no regional wall motion abnormalities. There is mild left ventricular hypertrophy. Left ventricular diastolic parameters are consistent with Grade I diastolic dysfunction (impaired relaxation).  2. Right ventricular systolic function is normal. The right ventricular size is mildly enlarged. There is moderately elevated pulmonary artery systolic pressure.  3. Left atrial size was mildly dilated.  4. The mitral valve is degenerative. Mild mitral valve regurgitation.  5. The aortic valve is grossly normal. Aortic valve regurgitation is trivial. Mild aortic valve sclerosis is present, with no evidence of aortic valve stenosis. FINDINGS  Left Ventricle: Left ventricular ejection fraction, by estimation, is 70 to 75%. The left ventricle has hyperdynamic function. The left ventricle has no regional wall motion abnormalities. The left ventricular internal cavity size was normal in size. There is mild left ventricular hypertrophy. Left ventricular diastolic parameters are consistent with Grade I diastolic dysfunction (impaired relaxation). Right Ventricle: The right ventricular size is  mildly enlarged. No increase in right ventricular wall thickness. Right ventricular systolic function is normal. There is moderately elevated pulmonary artery systolic pressure. The tricuspid regurgitant velocity is 3.52 m/s, and with an assumed right atrial pressure of 10 mmHg, the estimated right ventricular systolic pressure is  59.6 mmHg. Left Atrium: Left atrial size was mildly dilated. Right Atrium: Right atrial size was normal in size. Pericardium: There is no evidence of pericardial effusion. Mitral Valve: The mitral valve is degenerative in appearance. There is moderate calcification of the mitral valve leaflet(s). Mild mitral valve regurgitation. Tricuspid Valve: The tricuspid valve is grossly normal. Tricuspid valve regurgitation is mild. Aortic Valve: The aortic valve is grossly normal. Aortic valve regurgitation is trivial. Mild aortic valve sclerosis is present, with no evidence of aortic valve stenosis. Pulmonic Valve: The pulmonic valve was not well visualized. Pulmonic valve regurgitation is trivial. Aorta: The aortic root is normal in size and structure. IAS/Shunts: The interatrial septum was not well visualized.  LEFT VENTRICLE PLAX 2D LVIDd:         4.61 cm  Diastology LVIDs:         2.52 cm  LV e' lateral:   7.83 cm/s LV PW:         0.66 cm  LV E/e' lateral: 7.0 LV IVS:        0.69 cm  LV e' medial:    4.24 cm/s LVOT diam:     1.90 cm  LV E/e' medial:  13.0 LV SV:         43 LV SV Index:   29 LVOT Area:     2.84 cm  RIGHT VENTRICLE             IVC RV Basal diam:  4.10 cm     IVC diam: 2.40 cm RV S prime:     10.10 cm/s TAPSE (M-mode): 1.1 cm LEFT ATRIUM             Index       RIGHT ATRIUM           Index LA diam:        4.00 cm 2.71 cm/m  RA Area:     22.10 cm LA Vol (A2C):   48.1 ml 32.60 ml/m RA Volume:   72.20 ml  48.94 ml/m LA Vol (A4C):   59.7 ml 40.46 ml/m LA Biplane Vol: 58.6 ml 39.72 ml/m  AORTIC VALVE LVOT Vmax:   85.50 cm/s LVOT Vmean:  55.650 cm/s LVOT VTI:    0.153 m  AORTA Ao  Root diam: 3.00 cm MITRAL VALVE                TRICUSPID VALVE MV Area (PHT): 2.69 cm     TR Peak grad:   49.6 mmHg MV Decel Time: 282 msec     TR Vmax:        352.00 cm/s MV E velocity: 55.20 cm/s MV A velocity: 100.00 cm/s  SHUNTS MV E/A ratio:  0.55         Systemic VTI:  0.15 m                             Systemic Diam: 1.90 cm Bartholome Bill MD Electronically signed by Bartholome Bill MD Signature Date/Time: 03/23/2020/7:59:04 AM    Final                LOS: 2 days   Shaquan Missey  [facility]     03/24/2020, 11:57 AM

## 2020-03-25 DIAGNOSIS — J9621 Acute and chronic respiratory failure with hypoxia: Secondary | ICD-10-CM

## 2020-03-25 LAB — BASIC METABOLIC PANEL
Anion gap: 10 (ref 5–15)
BUN: 30 mg/dL — ABNORMAL HIGH (ref 8–23)
CO2: 29 mmol/L (ref 22–32)
Calcium: 8.6 mg/dL — ABNORMAL LOW (ref 8.9–10.3)
Chloride: 94 mmol/L — ABNORMAL LOW (ref 98–111)
Creatinine, Ser: 1.11 mg/dL — ABNORMAL HIGH (ref 0.44–1.00)
GFR calc Af Amer: 52 mL/min — ABNORMAL LOW (ref >60)
GFR calc non Af Amer: 45 mL/min — ABNORMAL LOW (ref >60)
Glucose, Bld: 101 mg/dL — ABNORMAL HIGH (ref 70–99)
Potassium: 3.7 mmol/L (ref 3.5–5.1)
Sodium: 133 mmol/L — ABNORMAL LOW (ref 135–145)

## 2020-03-25 MED ORDER — CARVEDILOL 3.125 MG PO TABS: 3.1250 mg | ORAL_TABLET | Freq: Two times a day (BID) | ORAL | 0 refills | Status: AC

## 2020-03-25 MED ORDER — PREDNISONE 20 MG PO TABS: 40.0000 mg | ORAL_TABLET | Freq: Every day | ORAL | 0 refills | Status: AC

## 2020-03-25 MED ORDER — LISINOPRIL 2.5 MG PO TABS: 2.5000 mg | ORAL_TABLET | Freq: Every day | ORAL | 0 refills | Status: AC

## 2020-03-25 NOTE — Progress Notes (Signed)
Patient is alert and oriented resp are even and unlabored at this time does become short of breath with exertion she denies any complaint of pain or needs at this time

## 2020-03-25 NOTE — Evaluation (Addendum)
Clinical/Bedside Swallow Evaluation Patient Details  Name: Stephanie Collins MRN: [id number] Date of Birth: May 22, 1932  Today's Date: 03/25/2020 Time: SLP Start Time (ACUTE ONLY): 1055 SLP Stop Time (ACUTE ONLY): 1155 SLP Time Calculation (min) (ACUTE ONLY): 60 min  Past Medical History:  Past Medical History:  Diagnosis Date  . Cancer ([place]) 2010   lung  . Cough 01/19/2016  . Hypertension   . Lung cancer ([place]) 01/19/2016   Past Surgical History:  Past Surgical History:  Procedure Laterality Date  . BREAST BIOPSY Left 2011   benign  . KYPHOPLASTY N/A 02/01/2015   Procedure: KYPHOPLASTY;  Surgeon: Hessie Knows, MD;  Location: [facility] ORS;  Service: Orthopedics;  Laterality: N/A;  T10   HPI:  Pt is a 86 year old female with a past medical history significant for Large Hiatal Hernia, lung cancer s/p right lower lobectomy, non-oxygen dependent, COPD, former tobacco abuse, hyperlipidemia, and hypertension who presented to the ED from Dr. Tillman Sers office for a few day history of increased shortness of breath and lower extremity swelling.  Workup in the ED was significant for O2 stats in the 80s on room air, chest xray revealing small bilateral pleural effusions.  Pt was admitted w/ dx of  acute hypoxic respiratory failure secondary to CHF acute onset. Unknown EF. Chest CT: "Esophageal hiatal hernia, large. Much of the stomach in the chest, possibly a paraesophageal hernia".  Assessment / Plan / Recommendation Clinical Impression  Pt appears to present w/ adequate oropharyngeal phase swallowing function w/No overt oropharyngeal phase dysphagia appreciated; No neuromuscular swallowing deficits appreciated. Pt is at reduced risk for aspiration from an oropharyngeal phase standpoint when following general aspiration precautions. Pt has a baseline of a "Large" Hiatal Hernia per Chest CT -- "Esophageal hiatal hernia, large. Much of the stomach in the chest, possibly a paraesophageal hernia". ANY Dysmotility or  Retrograde flow of Reflux material can increase risk for aspiration of the Reflux material during backflow thus impact Pulmonary status and increase risk for pneumonia. Pt described ongoing issues w/ discomfort in the mid-upper chest and inability to take full meals; no recent regurgitation but often the discomfort of delayed, dry cough ~1 hour AFTER meals.  For this eval, pt fed self consuming trials of thin liquids Via Cup(NO STRAWS) then purees/soft solids w/ no immediate, overt clinical s/s of aspiration noted; clear vocal quality b/t trials, no decline in respiratory effort, no multiple swallows noted post initial pharyngeal swallow. Oral phase appeared [facility] for bolus management, mastication, and timely A-P transfer/clearing of material. OM exam was [facility] for lingual/labial movements. No unilateral weakness. Speech clear. Recommend continue Regluar dietfor ease of choice of manageable foods as tolerates(w/ less meats/breads in diet, more Minced meats and moistened foods) w/ thin liquids via Cup/less straw use d/t air swallowing; general aspiration precautions.Rest Breaks during meals/oral intake to allow for Esophageal clearing.REFLUX precautions strongly recommended to lessen chance for Regurgitation.Recommend pt f/u w/ GI for any further Esophageal phase management and/or education. NSG updated and will reconsult ST services if any new needs while admitted. Much Time was spent w/ pt/family education; discussion of foods/meals; Handouts given on Esophageal phase dysmotility recommendations. SLP Visit Diagnosis: Dysphagia, pharyngoesophageal phase (R13.14) (Esophageal dysmotility)    Aspiration Risk   (reduced following Esophageal phase precs.)    Diet Recommendation  Regular diet w/ more Minced meats, gravies added to foods to moisten -- Smaller, frequent meals throughout the day w/ less meats/breads; general aspiration precautions. REFLUX precautions. F/u w/ Dietician for recommendations,  support.  Medication Administration: Whole meds with liquid (or Whole in Puree if easier for swallowing)    Other  Recommendations Recommended Consults: Consider GI evaluation;Consider esophageal assessment (for education) Oral Care Recommendations: Oral care BID;Oral care before and after PO;Patient independent with oral care Other Recommendations:  (n/a)   Follow up Recommendations None      Frequency and Duration  (n/a)   (n/a)       Prognosis Prognosis for Safe Diet Advancement: Fair Barriers to Reach Goals: Time post onset;Severity of deficits Barriers/Prognosis Comment: Large hiatal hernia      Swallow Study   General Date of Onset: 03/22/20 HPI: Pt is a 86 year old female with a past medical history significant for Large Hiatal Hernia, lung cancer s/p right lower lobectomy, non-oxygen dependent, COPD, former tobacco abuse, hyperlipidemia, and hypertension who presented to the ED from Dr. Tillman Sers office for a few day history of increased shortness of breath and lower extremity swelling.  Workup in the ED was significant for O2 stats in the 80s on room air, chest xray revealing small bilateral pleural effusions.  Pt was admitted w/ dx of  acute hypoxic respiratory failure secondary to CHF acute onset. Unknown EF. Type of Study: Bedside Swallow Evaluation Previous Swallow Assessment: none Diet Prior to this Study: Regular;Thin liquids Temperature Spikes Noted: No (wbc 10.0) Respiratory Status: Nasal cannula (3-4L) History of Recent Intubation: No Behavior/Cognition: Alert;Cooperative;Pleasant mood;Distractible;Requires cueing (min HOH) Oral Cavity Assessment: Within Functional Limits Oral Care Completed by SLP: Recent completion by staff Oral Cavity - Dentition: Dentures, top (native bottom dentition, missing few) Vision: Functional for self-feeding Self-Feeding Abilities: Able to feed self;Needs assist;Needs set up Patient Positioning: Upright in bed (min support w/  positioning more upright) Baseline Vocal Quality: Normal Volitional Cough: Strong;Congested (baseline) Volitional Swallow: Able to elicit    Oral/Motor/Sensory Function Overall Oral Motor/Sensory Function: Within functional limits   Ice Chips Ice chips: Not tested   Thin Liquid Thin Liquid: Within functional limits Presentation: Cup;Self Fed (7+ trials)    Nectar Thick Nectar Thick Liquid: Not tested   Honey Thick Honey Thick Liquid: Not tested   Puree Puree: Within functional limits Presentation: Self Fed;Spoon (~3 ozs)   Solid     Solid: Within functional limits (moistened) Presentation: Self Fed (all)       Orinda Kenner, MS, CCC-SLP Forbes Loll 03/25/2020,1:19 PM

## 2020-03-25 NOTE — Progress Notes (Signed)
PT Cancellation Note  Patient Details Name: Stephanie Collins MRN: [id number] DOB: [birth year]   Cancelled Treatment:    Reason Eval/Treat Not Completed: PT screened, no needs identified, will sign off. PT spoke with RN, OT, and pt/family. Pt and family reported near return to baseline level of functioning, has ambulated with RN with RW in hallway, up in chair. PT assisted with repositioning due to pt urination in chair, pt able rise from recliner modI and maintain balance well. Pt and family educated on potential benefit of follow up with PCP for HHPT or outpatient PT referral as needed, verbalized understanding, PT to sign off.    Lieutenant Diego PT, DPT 2:55 PM,03/25/20

## 2020-03-25 NOTE — Progress Notes (Signed)
Patient Name: Stephanie Collins Date of Encounter: 03/25/2020  Hospital Problem List     Active Problems:   Acute CHF (congestive heart failure) [facility])    Patient Profile     86 year old female with a past medical history significant for lung cancer s/p right lower lobectomy,non-oxygen dependentCOPD, former tobacco abuse, hyperlipidemia, and hypertension who presented to the ED from Dr. Tillman Sers office for a few day history of increased shortness of breath and lower extremity swelling. Workup in the ED was significant for O2 stats in the 80s on room air, BNP of 1976, chest xray revealing small bilateral pleural effusions, chest CT revealing no evidence of a PE with evidence of right heart failure, high sensitivity troponin 16 and 18 respectively, and ECG revealing normal sinus rhythm with no obvious acute ischemic changes. She was started on BiPAP and has since been transitioned to [facility].   03/23/20: Ms. Schmuck reports significant improvement in shortness of breath and lower extremity swelling since arrival. She is currently sitting up in bed, eating breakfast, in no acute distress. She denies chest pain, chest pressure, palpitations, or heart racing sensation. She denies orthopnea or PND.   03/24/20: Ms. Hartmann continues to improve and reports back to her baseline.  She denies shortness of breath, lower extremity swelling, orthopnea, or PND.  She was on 2L of supplemental O2 via [place] yesterday and is back up to 4 - unclear why.   03/25/20: States she is doing well this morning with less shortness of breath.  Her edema has improved.  BNP is down to 1578 from 1976.  Renal function yesterday was somewhat decreased.  Has been taken off of diuretics.  Metabolic panel from today is pending.  Appears hemodynamically stable with blood pressures in the 140s.  Heart rate stable.  Subjective   States she feels good and would like to go home.  Inpatient Medications    . carvedilol  3.125 mg Oral BID WC  .  enoxaparin (LOVENOX) injection  30 mg Subcutaneous Q24H  . lisinopril  2.5 mg Oral Daily  . mometasone-formoterol  2 puff Inhalation BID  . predniSONE  40 mg Oral Q breakfast  . sodium chloride flush  3 mL Intravenous Once  . sodium chloride flush  3 mL Intravenous Q12H    Vital Signs    Vitals:   03/24/20 1624 03/24/20 1919 03/25/20 0403 03/25/20 0732  BP: 128/72 109/65 (!) 141/86 (!) 141/73  Pulse: 65 63 67 (!) 59  Resp: 19 20 20 17   Temp:  98.1 F (36.7 C) 97.9 F (36.6 C) 97.6 F (36.4 C)  TempSrc:  Oral Oral Oral  SpO2: 91% 91% 99% 94%  Weight:   46.3 kg   Height:        Intake/Output Summary (Last 24 hours) at 03/25/2020 0829 Last data filed at 03/25/2020 0732 Gross per 24 hour  Intake 720 ml  Output 1950 ml  Net -1230 ml   Filed Weights   03/23/20 0131 03/24/20 0405 03/25/20 0403  Weight: 42.2 kg 47 kg 46.3 kg    Physical Exam    GEN: Well nourished, well developed, in no acute distress.  HEENT: normal.  Neck: Supple, no JVD, carotid bruits, or masses. Cardiac: RRR, no murmurs, rubs, or gallops. No clubbing, cyanosis, edema.  Radials/DP/PT 2+ and equal bilaterally.  Respiratory:  Respirations regular and unlabored, clear to auscultation bilaterally. GI: Soft, nontender, nondistended, BS + x 4. MS: no deformity or atrophy. Skin: warm and  dry, no rash. Neuro:  Strength and sensation are intact. Psych: Normal affect.  Labs    CBC Recent Labs    03/22/20 1434  WBC 10.0  HGB 13.4  HCT 40.0  MCV 82.8  PLT 478   Basic Metabolic Panel Recent Labs    03/22/20 1434 03/24/20 0939  NA 135 135  K 4.7 3.2*  CL 98 94*  CO2 28 31  GLUCOSE 94 109*  BUN 26* 26*  CREATININE 1.16* 1.32*  CALCIUM 9.5 8.6*  MG  --  2.2   Liver Function Tests Recent Labs    03/22/20 1434  AST 22  ALT 22  ALKPHOS 110  BILITOT 1.6*  PROT 7.4  ALBUMIN 3.9   No results for input(s): LIPASE, AMYLASE in the last 72 hours. Cardiac Enzymes No results for input(s):  CKTOTAL, CKMB, CKMBINDEX, TROPONINI in the last 72 hours. BNP Recent Labs    03/22/20 1434 03/24/20 0939  BNP 1,976.3* 1,570.9*   D-Dimer No results for input(s): DDIMER in the last 72 hours. Hemoglobin A1C No results for input(s): HGBA1C in the last 72 hours. Fasting Lipid Panel No results for input(s): CHOL, HDL, LDLCALC, TRIG, CHOLHDL, LDLDIRECT in the last 72 hours. Thyroid Function Tests No results for input(s): TSH, T4TOTAL, T3FREE, THYROIDAB in the last 72 hours.  Invalid input(s): FREET3  Telemetry    Normal sinus rhythm  ECG      Radiology    DG Chest 2 View  Result Date: 03/22/2020 CLINICAL DATA:  86 year old female with shortness of breath. EXAM: CHEST - 2 VIEW COMPARISON:  Chest radiograph dated 01/19/2016 FINDINGS: Small bilateral pleural effusions, right greater left. There are bibasilar densities which may represent atelectasis or infiltrate. There is background of chronic interstitial coarsening and bronchitic changes. No pneumothorax. Stable cardiac silhouette. Atherosclerotic calcification of the aorta. There is a moderate size hiatal hernia. Osteopenia with degenerative changes of the spine and old multilevel compression fractures and vertebroplasty. IMPRESSION: Small bilateral pleural effusions with bibasilar atelectasis versus infiltrate. Electronically Signed   By: Anner Crete M.D.   On: 03/22/2020 15:14   CT ANGIO CHEST PE W OR WO CONTRAST  Result Date: 03/23/2020 CLINICAL DATA:  Suspected pulmonary embolus. High probability. Shortness of breath, congestive heart failure, decreased oxygen saturation. EXAM: CT ANGIOGRAPHY CHEST WITH CONTRAST TECHNIQUE: Multidetector CT imaging of the chest was performed using the standard protocol during bolus administration of intravenous contrast. Multiplanar CT image reconstructions and MIPs were obtained to evaluate the vascular anatomy. CONTRAST:  22mL OMNIPAQUE IOHEXOL 350 MG/ML SOLN COMPARISON:  CT chest 07/23/2013.   Chest radiograph 03/22/2020 FINDINGS: Cardiovascular: End of the central and segmental pulmonary arteries. Extrinsic impression on the distal right lobar pulmonary artery causing partial effacement but the vessel remains patent. No intrinsic filling defects are demonstrated. Cardiac enlargement with prominence of the right heart. Reflux of contrast material into the IVC may indicate right heart failure. Normal caliber thoracic aorta with diffuse calcification. Mediastinum/Nodes: Esophageal hiatal hernia. Mildly dilated esophagus possibly due to reflux disease. Masslike density in the right lower mediastinum likely represents the fluid-filled herniated stomach. The EG junction appears to be at the normal locations suggesting a paraesophageal hernia. Scattered lymph nodes are not pathologically enlarged. Lungs/Pleura: Prominent diffuse emphysematous changes throughout the lungs. Peripheral interstitial fibrosis. There is obstruction of the bronchus intermedius on the right, possibly indicating aspirated material, mucoid impaction, or an endobronchial lesion. Consider referral for bronchoscopy if clinically indicated. Mild atelectasis in the lung bases. Upper Abdomen: No acute  abnormality. Musculoskeletal: Degenerative changes throughout the spine. Multiple thoracic vertebral compression deformities post kyphoplasty changes. Review of the MIP images confirms the above findings. IMPRESSION: 1. No evidence of acute pulmonary embolus. Extrinsic impression on the distal right lobar pulmonary artery causing partial effacement but the vessel remains patent. 2. Cardiac enlargement with evidence of right heart failure. 3. Prominent diffuse emphysematous changes throughout the lungs. Peripheral interstitial fibrosis. 4. Obstruction of the bronchus intermedius on the right, possibly indicating aspirated material, mucoid impaction, or an endobronchial lesion. Consider referral for bronchoscopy if clinically indicated. 5. Masslike  density in the right lower mediastinum likely represents the fluid-filled herniated stomach. 6. Esophageal hiatal hernia. With much of the stomach in the chest, possibly a paraesophageal hernia. 7. Multiple thoracic vertebral compression deformities post kyphoplasty changes. 8. Emphysema and aortic atherosclerosis. Aortic Atherosclerosis (ICD10-I70.0) and Emphysema (ICD10-J43.9). Electronically Signed   By: Lucienne Capers M.D.   On: 03/23/2020 00:38   ECHOCARDIOGRAM COMPLETE  Result Date: 03/23/2020    ECHOCARDIOGRAM REPORT   Patient Name:   RYLEE HUESTIS Date of Exam: 03/22/2020 Medical Rec #:  [id number]   Height:       60.0 in Accession #:    [id number]  Weight:       115.0 lb Date of Birth:  1931/12/08   BSA:          1.475 m Patient Age:    25 years    BP:           146/79 mmHg Patient Gender: F           HR:           81 bpm. Exam Location:  [facility] Procedure: 2D Echo, Cardiac Doppler and Color Doppler Indications:     R06.89 Acute respiratory insufficiency  History:         Patient has no prior history of Echocardiogram examinations.                  Risk Factors:Hypertension. Lung cancer.  Sonographer:     Wilford Sports Rodgers-Jones Referring Phys:  [place] Diagnosing Phys: Bartholome Bill MD IMPRESSIONS  1. Left ventricular ejection fraction, by estimation, is 70 to 75%. The left ventricle has hyperdynamic function. The left ventricle has no regional wall motion abnormalities. There is mild left ventricular hypertrophy. Left ventricular diastolic parameters are consistent with Grade I diastolic dysfunction (impaired relaxation).  2. Right ventricular systolic function is normal. The right ventricular size is mildly enlarged. There is moderately elevated pulmonary artery systolic pressure.  3. Left atrial size was mildly dilated.  4. The mitral valve is degenerative. Mild mitral valve regurgitation.  5. The aortic valve is grossly normal. Aortic valve regurgitation is trivial. Mild aortic valve sclerosis is  present, with no evidence of aortic valve stenosis. FINDINGS  Left Ventricle: Left ventricular ejection fraction, by estimation, is 70 to 75%. The left ventricle has hyperdynamic function. The left ventricle has no regional wall motion abnormalities. The left ventricular internal cavity size was normal in size. There is mild left ventricular hypertrophy. Left ventricular diastolic parameters are consistent with Grade I diastolic dysfunction (impaired relaxation). Right Ventricle: The right ventricular size is mildly enlarged. No increase in right ventricular wall thickness. Right ventricular systolic function is normal. There is moderately elevated pulmonary artery systolic pressure. The tricuspid regurgitant velocity is 3.52 m/s, and with an assumed right atrial pressure of 10 mmHg, the estimated right ventricular systolic pressure is 16.3 mmHg. Left Atrium: Left atrial size  was mildly dilated. Right Atrium: Right atrial size was normal in size. Pericardium: There is no evidence of pericardial effusion. Mitral Valve: The mitral valve is degenerative in appearance. There is moderate calcification of the mitral valve leaflet(s). Mild mitral valve regurgitation. Tricuspid Valve: The tricuspid valve is grossly normal. Tricuspid valve regurgitation is mild. Aortic Valve: The aortic valve is grossly normal. Aortic valve regurgitation is trivial. Mild aortic valve sclerosis is present, with no evidence of aortic valve stenosis. Pulmonic Valve: The pulmonic valve was not well visualized. Pulmonic valve regurgitation is trivial. Aorta: The aortic root is normal in size and structure. IAS/Shunts: The interatrial septum was not well visualized.  LEFT VENTRICLE PLAX 2D LVIDd:         4.61 cm  Diastology LVIDs:         2.52 cm  LV e' lateral:   7.83 cm/s LV PW:         0.66 cm  LV E/e' lateral: 7.0 LV IVS:        0.69 cm  LV e' medial:    4.24 cm/s LVOT diam:     1.90 cm  LV E/e' medial:  13.0 LV SV:         43 LV SV Index:    29 LVOT Area:     2.84 cm  RIGHT VENTRICLE             IVC RV Basal diam:  4.10 cm     IVC diam: 2.40 cm RV S prime:     10.10 cm/s TAPSE (M-mode): 1.1 cm LEFT ATRIUM             Index       RIGHT ATRIUM           Index LA diam:        4.00 cm 2.71 cm/m  RA Area:     22.10 cm LA Vol (A2C):   48.1 ml 32.60 ml/m RA Volume:   72.20 ml  48.94 ml/m LA Vol (A4C):   59.7 ml 40.46 ml/m LA Biplane Vol: 58.6 ml 39.72 ml/m  AORTIC VALVE LVOT Vmax:   85.50 cm/s LVOT Vmean:  55.650 cm/s LVOT VTI:    0.153 m  AORTA Ao Root diam: 3.00 cm MITRAL VALVE                TRICUSPID VALVE MV Area (PHT): 2.69 cm     TR Peak grad:   49.6 mmHg MV Decel Time: 282 msec     TR Vmax:        352.00 cm/s MV E velocity: 55.20 cm/s MV A velocity: 100.00 cm/s  SHUNTS MV E/A ratio:  0.55         Systemic VTI:  0.15 m                             Systemic Diam: 1.90 cm Bartholome Bill MD Electronically signed by Bartholome Bill MD Signature Date/Time: 03/23/2020/7:59:04 AM    Final     Assessment & Plan    1. Acute CHF  -Improved clinically -Has diuresed  6 Liters since admission; appears back to her baseline weight             -Would evaluate renal function and potassium today.  May be able to add back Lasix at 20 mg daily. -Echocardiogram this admission revealing preserved LV systolic function with an EF estimated between 70-75% with grade 1 diastolic  dysfunction  -Discussed the importance of a low sodium diet, daily weights at discharge  -Ambulate today and if oxygen saturation remained stable and renal function improves, consider discharge with outpatient follow-up in 1 week.  2. Bilateral lower extremity edema  -Would agree to continue to hold amlodipine   3. COPD  -Off of BiPAP, currently on [place] at 4 L.  Would attempt to reduce to 2 L.  May need oxygen at discharge.   4. Hypertension  -Amlodipine being held due to  lower extremity swelling  -Carvedilol and lisinopril added; will continue with this and follow   Active Problems:   Acute CHF (congestive heart failure) (HCC)     Signed, Javier Docker. Averyanna Sax MD 03/25/2020, 8:29 AM  Pager: [phone]

## 2020-03-25 NOTE — Discharge Summary (Signed)
Physician Discharge Summary  Stephanie Collins FYB:[id number] DOB: 01/15/1932 DOA: 03/22/2020  PCP: Baxter Hire, MD  Admit date: 03/22/2020 Discharge date: 03/25/2020  Discharge disposition: Home   Recommendations for Outpatient Follow-Up:   Outpatient follow-up with PCP in 1 week Follow-up at CHF clinic as scheduled on 04/12/2020 Follow-up with cardiologist (cardiologist will schedule appointment)    Discharge Diagnosis:   Principal Problem:   Acute CHF (congestive heart failure) ([place]) Active Problems:   Acute on chronic respiratory failure with hypoxia ([place])    Discharge Condition: Stable.  Diet recommendation:  Diet Order            Diet - low sodium heart healthy           Diet Heart Room service appropriate? Yes; Fluid consistency: Thin  Diet effective now                   Code Status: Full Code     Hospital Course:   Stephanie Collins is a 86 y.o. female with medical history significant for COPD, ex smoker, history of lung cancerstatus post right lower lobectomy, hyperlipidemia, hypertension, CKD stage IIIb, osteoarthritis and osteoporosis.  She presented to the hospital with increasing shortness of breath and bilateral lower extremity edema for about 3 to 4 days duration.  She had gone to see her PCP in the office but she was referred to the emergency room because she was hypoxemic with oxygen saturation in the 80s.  She was admitted to the hospital for acute hypoxemic respiratory failure and acute diastolic CHF.  She was treated with oxygen via nasal cannula and required BiPAP at some point.  She was also treated with IV Lasix.  Prednisone was added for probable acute COPD exacerbation.  2D echo showed EF estimated at 70 to 52%, grade 1 diastolic dysfunction.  Unfortunately, she could not be weaned off of oxygen.  Oxygen saturation on room air at rest was 72% and oxygen saturation with ambulation was 75% on room air.  Oxygen saturation improved to 93% on 4 L/min of  oxygen.  She will be discharged on home oxygen and this has been set up by the case manager.  She has been advised to wear oxygen at all times.  She was offered a nebulizer with albuterol and ipratropium Nebules for home use as needed but she declined.  She prefers to follow-up with PCP for this.  Her condition has improved and she wants to go home today because today's her daughter's birthday.  She is stable for discharge.  Discharge plan was discussed with the patient and her daughter at the bedside.   Medical Consultants:    Cardiologist, Dr. Ubaldo Glassing   Discharge Exam:    Vitals:   03/24/20 1919 03/25/20 0403 03/25/20 0732 03/25/20 1210  BP: 109/65 (!) 141/86 (!) 141/73 (!) 142/75  Pulse: 63 67 (!) 59 66  Resp: 20 20 17 17   Temp: 98.1 F (36.7 C) 97.9 F (36.6 C) 97.6 F (36.4 C) 97.6 F (36.4 C)  TempSrc: Oral Oral Oral   SpO2: 91% 99% 94% 95%  Weight:  46.3 kg    Height:         GEN: NAD SKIN: No rash EYES: EOMI ENT: MMM CV: RRR PULM: occasional rhonchi, no rales heard ABD: soft, ND, NT, +BS CNS: AAO x 3, non focal EXT: No edema or tenderness   The results of significant diagnostics from this hospitalization (including imaging, microbiology, ancillary and laboratory) are  listed below for reference.     Procedures and Diagnostic Studies:   DG Chest 2 View  Result Date: 03/22/2020 CLINICAL DATA:  86 year old female with shortness of breath. EXAM: CHEST - 2 VIEW COMPARISON:  Chest radiograph dated 01/19/2016 FINDINGS: Small bilateral pleural effusions, right greater left. There are bibasilar densities which may represent atelectasis or infiltrate. There is background of chronic interstitial coarsening and bronchitic changes. No pneumothorax. Stable cardiac silhouette. Atherosclerotic calcification of the aorta. There is a moderate size hiatal hernia. Osteopenia with degenerative changes of the spine and old multilevel compression fractures and vertebroplasty.  IMPRESSION: Small bilateral pleural effusions with bibasilar atelectasis versus infiltrate. Electronically Signed   By: Anner Crete M.D.   On: 03/22/2020 15:14   CT ANGIO CHEST PE W OR WO CONTRAST  Result Date: 03/23/2020 CLINICAL DATA:  Suspected pulmonary embolus. High probability. Shortness of breath, congestive heart failure, decreased oxygen saturation. EXAM: CT ANGIOGRAPHY CHEST WITH CONTRAST TECHNIQUE: Multidetector CT imaging of the chest was performed using the standard protocol during bolus administration of intravenous contrast. Multiplanar CT image reconstructions and MIPs were obtained to evaluate the vascular anatomy. CONTRAST:  44mL OMNIPAQUE IOHEXOL 350 MG/ML SOLN COMPARISON:  CT chest 07/23/2013.  Chest radiograph 03/22/2020 FINDINGS: Cardiovascular: End of the central and segmental pulmonary arteries. Extrinsic impression on the distal right lobar pulmonary artery causing partial effacement but the vessel remains patent. No intrinsic filling defects are demonstrated. Cardiac enlargement with prominence of the right heart. Reflux of contrast material into the IVC may indicate right heart failure. Normal caliber thoracic aorta with diffuse calcification. Mediastinum/Nodes: Esophageal hiatal hernia. Mildly dilated esophagus possibly due to reflux disease. Masslike density in the right lower mediastinum likely represents the fluid-filled herniated stomach. The EG junction appears to be at the normal locations suggesting a paraesophageal hernia. Scattered lymph nodes are not pathologically enlarged. Lungs/Pleura: Prominent diffuse emphysematous changes throughout the lungs. Peripheral interstitial fibrosis. There is obstruction of the bronchus intermedius on the right, possibly indicating aspirated material, mucoid impaction, or an endobronchial lesion. Consider referral for bronchoscopy if clinically indicated. Mild atelectasis in the lung bases. Upper Abdomen: No acute abnormality.  Musculoskeletal: Degenerative changes throughout the spine. Multiple thoracic vertebral compression deformities post kyphoplasty changes. Review of the MIP images confirms the above findings. IMPRESSION: 1. No evidence of acute pulmonary embolus. Extrinsic impression on the distal right lobar pulmonary artery causing partial effacement but the vessel remains patent. 2. Cardiac enlargement with evidence of right heart failure. 3. Prominent diffuse emphysematous changes throughout the lungs. Peripheral interstitial fibrosis. 4. Obstruction of the bronchus intermedius on the right, possibly indicating aspirated material, mucoid impaction, or an endobronchial lesion. Consider referral for bronchoscopy if clinically indicated. 5. Masslike density in the right lower mediastinum likely represents the fluid-filled herniated stomach. 6. Esophageal hiatal hernia. With much of the stomach in the chest, possibly a paraesophageal hernia. 7. Multiple thoracic vertebral compression deformities post kyphoplasty changes. 8. Emphysema and aortic atherosclerosis. Aortic Atherosclerosis (ICD10-I70.0) and Emphysema (ICD10-J43.9). Electronically Signed   By: Lucienne Capers M.D.   On: 03/23/2020 00:38   ECHOCARDIOGRAM COMPLETE  Result Date: 03/23/2020    ECHOCARDIOGRAM REPORT   Patient Name:   Stephanie Collins Date of Exam: 03/22/2020 Medical Rec #:  [id number]   Height:       60.0 in Accession #:    [id number]  Weight:       115.0 lb Date of Birth:  02-16-1932   BSA:  1.475 m Patient Age:    54 years    BP:           146/79 mmHg Patient Gender: F           HR:           81 bpm. Exam Location:  [facility] Procedure: 2D Echo, Cardiac Doppler and Color Doppler Indications:     R06.89 Acute respiratory insufficiency  History:         Patient has no prior history of Echocardiogram examinations.                  Risk Factors:Hypertension. Lung cancer.  Sonographer:     Wilford Sports Rodgers-Jones Referring Phys:  [place] Diagnosing Phys:  Bartholome Bill MD IMPRESSIONS  1. Left ventricular ejection fraction, by estimation, is 70 to 75%. The left ventricle has hyperdynamic function. The left ventricle has no regional wall motion abnormalities. There is mild left ventricular hypertrophy. Left ventricular diastolic parameters are consistent with Grade I diastolic dysfunction (impaired relaxation).  2. Right ventricular systolic function is normal. The right ventricular size is mildly enlarged. There is moderately elevated pulmonary artery systolic pressure.  3. Left atrial size was mildly dilated.  4. The mitral valve is degenerative. Mild mitral valve regurgitation.  5. The aortic valve is grossly normal. Aortic valve regurgitation is trivial. Mild aortic valve sclerosis is present, with no evidence of aortic valve stenosis. FINDINGS  Left Ventricle: Left ventricular ejection fraction, by estimation, is 70 to 75%. The left ventricle has hyperdynamic function. The left ventricle has no regional wall motion abnormalities. The left ventricular internal cavity size was normal in size. There is mild left ventricular hypertrophy. Left ventricular diastolic parameters are consistent with Grade I diastolic dysfunction (impaired relaxation). Right Ventricle: The right ventricular size is mildly enlarged. No increase in right ventricular wall thickness. Right ventricular systolic function is normal. There is moderately elevated pulmonary artery systolic pressure. The tricuspid regurgitant velocity is 3.52 m/s, and with an assumed right atrial pressure of 10 mmHg, the estimated right ventricular systolic pressure is 38.7 mmHg. Left Atrium: Left atrial size was mildly dilated. Right Atrium: Right atrial size was normal in size. Pericardium: There is no evidence of pericardial effusion. Mitral Valve: The mitral valve is degenerative in appearance. There is moderate calcification of the mitral valve leaflet(s). Mild mitral valve regurgitation. Tricuspid Valve: The  tricuspid valve is grossly normal. Tricuspid valve regurgitation is mild. Aortic Valve: The aortic valve is grossly normal. Aortic valve regurgitation is trivial. Mild aortic valve sclerosis is present, with no evidence of aortic valve stenosis. Pulmonic Valve: The pulmonic valve was not well visualized. Pulmonic valve regurgitation is trivial. Aorta: The aortic root is normal in size and structure. IAS/Shunts: The interatrial septum was not well visualized.  LEFT VENTRICLE PLAX 2D LVIDd:         4.61 cm  Diastology LVIDs:         2.52 cm  LV e' lateral:   7.83 cm/s LV PW:         0.66 cm  LV E/e' lateral: 7.0 LV IVS:        0.69 cm  LV e' medial:    4.24 cm/s LVOT diam:     1.90 cm  LV E/e' medial:  13.0 LV SV:         43 LV SV Index:   29 LVOT Area:     2.84 cm  RIGHT VENTRICLE  IVC RV Basal diam:  4.10 cm     IVC diam: 2.40 cm RV S prime:     10.10 cm/s TAPSE (M-mode): 1.1 cm LEFT ATRIUM             Index       RIGHT ATRIUM           Index LA diam:        4.00 cm 2.71 cm/m  RA Area:     22.10 cm LA Vol (A2C):   48.1 ml 32.60 ml/m RA Volume:   72.20 ml  48.94 ml/m LA Vol (A4C):   59.7 ml 40.46 ml/m LA Biplane Vol: 58.6 ml 39.72 ml/m  AORTIC VALVE LVOT Vmax:   85.50 cm/s LVOT Vmean:  55.650 cm/s LVOT VTI:    0.153 m  AORTA Ao Root diam: 3.00 cm MITRAL VALVE                TRICUSPID VALVE MV Area (PHT): 2.69 cm     TR Peak grad:   49.6 mmHg MV Decel Time: 282 msec     TR Vmax:        352.00 cm/s MV E velocity: 55.20 cm/s MV A velocity: 100.00 cm/s  SHUNTS MV E/A ratio:  0.55         Systemic VTI:  0.15 m                             Systemic Diam: 1.90 cm Bartholome Bill MD Electronically signed by Bartholome Bill MD Signature Date/Time: 03/23/2020/7:59:04 AM    Final      Labs:   Basic Metabolic Panel: Recent Labs  Lab 03/22/20 1434 03/22/20 1434 03/24/20 0939 03/25/20 0851  NA 135  --  135 133*  K 4.7   < > 3.2* 3.7  CL 98  --  94* 94*  CO2 28  --  31 29  GLUCOSE 94  --  109* 101*  BUN  26*  --  26* 30*  CREATININE 1.16*  --  1.32* 1.11*  CALCIUM 9.5  --  8.6* 8.6*  MG  --   --  2.2  --    < > = values in this interval not displayed.   GFR Estimated Creatinine Clearance: 26.1 mL/min (A) (by C-G formula based on SCr of 1.11 mg/dL (H)). Liver Function Tests: Recent Labs  Lab 03/22/20 1434  AST 22  ALT 22  ALKPHOS 110  BILITOT 1.6*  PROT 7.4  ALBUMIN 3.9   No results for input(s): LIPASE, AMYLASE in the last 168 hours. No results for input(s): AMMONIA in the last 168 hours. Coagulation profile No results for input(s): INR, PROTIME in the last 168 hours.  CBC: Recent Labs  Lab 03/22/20 1434  WBC 10.0  HGB 13.4  HCT 40.0  MCV 82.8  PLT 337   Cardiac Enzymes: No results for input(s): CKTOTAL, CKMB, CKMBINDEX, TROPONINI in the last 168 hours. BNP: Invalid input(s): POCBNP CBG: Recent Labs  Lab 03/23/20 0826  GLUCAP 114*   D-Dimer No results for input(s): DDIMER in the last 72 hours. Hgb A1c No results for input(s): HGBA1C in the last 72 hours. Lipid Profile No results for input(s): CHOL, HDL, LDLCALC, TRIG, CHOLHDL, LDLDIRECT in the last 72 hours. Thyroid function studies No results for input(s): TSH, T4TOTAL, T3FREE, THYROIDAB in the last 72 hours.  Invalid input(s): FREET3 Anemia work up No results for input(s): VITAMINB12, FOLATE, FERRITIN,  TIBC, IRON, RETICCTPCT in the last 72 hours. Microbiology Recent Results (from the past 240 hour(s))  SARS Coronavirus 2 by RT PCR (hospital order, performed in [facility] hospital lab) Nasopharyngeal Nasopharyngeal Swab     Status: None   Collection Time: 03/22/20  3:12 PM   Specimen: Nasopharyngeal Swab  Result Value Ref Range Status   SARS Coronavirus 2 NEGATIVE NEGATIVE Final    Comment: (NOTE) SARS-CoV-2 target nucleic acids are NOT DETECTED.  The SARS-CoV-2 RNA is generally detectable in upper and lower respiratory specimens during the acute phase of infection. The lowest concentration of  SARS-CoV-2 viral copies this assay can detect is 250 copies / mL. A negative result does not preclude SARS-CoV-2 infection and should not be used as the sole basis for treatment or other patient management decisions.  A negative result may occur with improper specimen collection / handling, submission of specimen other than nasopharyngeal swab, presence of viral mutation(s) within the areas targeted by this assay, and inadequate number of viral copies (<250 copies / mL). A negative result must be combined with clinical observations, patient history, and epidemiological information.  Fact Sheet for Patients:   StrictlyIdeas.no  Fact Sheet for Healthcare Providers: BankingDealers.co.za  This test is not yet approved or  cleared by the [place] FDA and has been authorized for detection and/or diagnosis of SARS-CoV-2 by FDA under an Emergency Use Authorization (EUA).  This EUA will remain in effect (meaning this test can be used) for the duration of the COVID-19 declaration under Section 564(b)(1) of the Act, 21 U.S.C. section 360bbb-3(b)(1), unless the authorization is terminated or revoked sooner.  Performed at [facility], [address], [place], [place] [address]      Discharge Instructions:   Discharge Instructions    ([place]) Call MD:  Anytime you have any of the following symptoms: 1) 3 pound weight gain in 24 hours or 5 pounds in 1 week 2) shortness of breath, with or without a dry hacking cough 3) swelling in the hands, feet or stomach 4) if you have to sleep on extra pillows at night in order to breathe.   Complete by: As directed    AMB referral to CHF clinic   Complete by: As directed    Diet - low sodium heart healthy   Complete by: As directed    For home use only DME oxygen   Complete by: As directed    Length of Need: Lifetime   Mode or (Route): Nasal cannula   Liters per Minute: 4    Frequency: Continuous (stationary and portable oxygen unit needed)   Oxygen delivery system: Gas   Heart Failure patients record your daily weight using the same scale at the same time of day   Complete by: As directed    Increase activity slowly   Complete by: As directed      Allergies as of 03/25/2020      Reactions   Tramadol    confusion      Medication List    STOP taking these medications   amLODipine 5 MG tablet Commonly known as: NORVASC     TAKE these medications   albuterol 108 (90 Base) MCG/ACT inhaler Commonly known as: VENTOLIN HFA Inhale 2 puffs into the lungs every 4 (four) hours as needed for wheezing or shortness of breath.   carvedilol 3.125 MG tablet Commonly known as: COREG Take 1 tablet (3.125 mg total) by mouth 2 (two) times daily with a  meal.   Fluticasone-Salmeterol 250-50 MCG/DOSE Aepb Commonly known as: ADVAIR Inhale 1 puff into the lungs 2 (two) times daily.   lisinopril 2.5 MG tablet Commonly known as: ZESTRIL Take 1 tablet (2.5 mg total) by mouth daily. Start taking on: March 26, 2020   predniSONE 20 MG tablet Commonly known as: DELTASONE Take 2 tablets (40 mg total) by mouth daily with breakfast for 3 days. Start taking on: March 26, 2020            Durable Medical Equipment  (From admission, onward)         Start     Ordered   03/25/20 0000  For home use only DME oxygen       Question Answer Comment  Length of Need Lifetime   Mode or (Route) Nasal cannula   Liters per Minute 4   Frequency Continuous (stationary and portable oxygen unit needed)   Oxygen delivery system Gas      03/25/20 1330          Follow-up Information    [place] Follow up on 04/12/2020.   Specialty: Cardiology Why: at 11:30am. Enter through the [place] entrance Contact information: [place] [phone]               Time  coordinating discharge: 35 minutes  Signed:  Jennye Boroughs  [facility] 03/25/2020, 1:31 PM

## 2020-03-25 NOTE — Care Management Important Message (Signed)
Important Message  Patient Details  Name: Stephanie Collins MRN: [id number] Date of Birth: 1932-01-23   Medicare Important Message Given:  Yes     Juliann Pulse A Kade Demicco 03/25/2020, 2:58 PM

## 2020-03-25 NOTE — Progress Notes (Signed)
SATURATION QUALIFICATIONS: (This note is used to comply with regulatory documentation for home oxygen)  Patient Saturations on Room Air at Rest =72 %  Patient Saturations on Room Air while Ambulating =75 %  Patient Saturations 4 Liters of oxygen while Ambulating =93%  Please briefly explain why patient needs home oxygen:Patient 02 saturation declines greatly when she is up moving with simple ambulation before she even was up walking as soon as I took 02 off she desated

## 2020-03-25 NOTE — Evaluation (Signed)
Occupational Therapy Evaluation Patient Details Name: Stephanie Collins MRN: [id number] DOB: [birth year] Today's Date: 03/25/2020    History of Present Illness 86 year old female with a past medical history significant for Large Hiatal Hernia, lung cancer s/p right lower lobectomy, non-oxygen dependent, COPD, former tobacco abuse, hyperlipidemia, and hypertension who presented to the ED from Dr. Tillman Sers office for a few day history of increased shortness of breath and lower extremity swelling.  Workup in the ED was significant for O2 stats in the 80s on room air, chest xray revealing small bilateral pleural effusions.  Pt was admitted w/ dx of  acute hypoxic respiratory failure secondary to CHF acute onset.   Clinical Impression   Patient presenting with decreased I in self care, balance, functional mobility/transfers, endurance, and safety awareness. Patient reports being mod I PTA with use of cane. Patient currently functioning at supervision - mod I. OT attempted to decrease O2 while in room but pt unable to maintain O2 above 90% without being on 3L via . OT providing education regarding energy conservation for home.  Patient will benefit from acute OT to increase overall independence in the areas of ADLs, functional mobility, and safety awarenes in order to safely discharge home.     Follow Up Recommendations  No OT follow up;Supervision - Intermittent    Equipment Recommendations  None recommended by OT    Recommendations for Other Services Other (comment) (none at this time)     Precautions / Restrictions Precautions Precautions: Fall      Mobility Bed Mobility Overal bed mobility: Modified Independent       Transfers Overall transfer level: Needs assistance Equipment used: Rolling walker (2 wheeled) Transfers: Sit to/from Stand Sit to Stand: Supervision              Balance Overall balance assessment: Needs assistance Sitting-balance support: Feet supported Sitting  balance-Leahy Scale: Normal     Standing balance support: During functional activity Standing balance-Leahy Scale: Good         ADL either performed or assessed with clinical judgement   ADL Overall ADL's : Needs assistance/impaired Eating/Feeding: Independent;Sitting   Grooming: Wash/dry hands;Wash/dry face;Oral care;Sitting;Supervision/safety   Upper Body Bathing: Set up;Sitting   Lower Body Bathing: Set up;Sit to/from stand   Upper Body Dressing : Set up;Sitting   Lower Body Dressing: Supervision/safety;Sit to/from stand   Toilet Transfer: Supervision/safety;RW   Toileting- Water quality scientist and Hygiene: Supervision/safety;Sit to/from stand   Tub/ Shower Transfer: Supervision/safety;Ambulation           Vision Baseline Vision/History: Wears glasses Wears Glasses: At all times Patient Visual Report: No change from baseline Vision Assessment?: No apparent visual deficits            Pertinent Vitals/Pain Pain Assessment: No/denies pain        Extremity/Trunk Assessment Upper Extremity Assessment Upper Extremity Assessment: Generalized weakness   Lower Extremity Assessment Lower Extremity Assessment: Defer to PT evaluation       Communication     Cognition Arousal/Alertness: Awake/alert Behavior During Therapy: WFL for tasks assessed/performed Overall Cognitive Status: Within Functional Limits for tasks assessed                          Home Living Family/patient expects to be discharged to:: Private residence Living Arrangements: Alone Available Help at Discharge: Family;Available PRN/intermittently Type of Home: House Home Access: Stairs to enter [employer] of Steps: 2 STE  OT Problem List: Decreased strength;Decreased activity tolerance;Decreased safety awareness;Impaired balance (sitting and/or standing)      OT Treatment/Interventions: Self-care/ADL training;Therapeutic  exercise;Therapeutic activities;Energy conservation;Patient/family education    OT Goals(Current goals can be found in the care plan section) Acute Rehab OT Goals Patient Stated Goal: To go home OT Goal Formulation: With patient/family Time For Goal Achievement: 04/08/20 Potential to Achieve Goals: Good  OT Frequency: Min 1X/week   Barriers to D/C: Other (comment)  none known at this time          AM-PAC OT "6 Clicks" Daily Activity     Outcome Measure Help from another person eating meals?: None Help from another person taking care of personal grooming?: None Help from another person toileting, which includes using toliet, bedpan, or urinal?: A Little Help from another person bathing (including washing, rinsing, drying)?: A Little Help from another person to put on and taking off regular upper body clothing?: None Help from another person to put on and taking off regular lower body clothing?: A Little 6 Click Score: 21   End of Session Equipment Utilized During Treatment: Oxygen Nurse Communication: Mobility status;Precautions  Activity Tolerance: Patient tolerated treatment well Patient left: in chair;with chair alarm set;with family/visitor present  OT Visit Diagnosis: Unsteadiness on feet (R26.81);Muscle weakness (generalized) (M62.81)                Time: 3013-1438 OT Time Calculation (min): 30 min Charges:  OT General Charges $OT Visit: 1 Visit OT Evaluation $OT Eval Low Complexity: 1 Low OT Treatments $Self Care/Home Management : 8-22 mins  Darleen Crocker, MS, OTR/L , CBIS ascom [phone]  03/25/20, 1:51 PM

## 2020-03-25 NOTE — Discharge Instructions (Signed)
Heart Failure, Self Care Heart failure is a serious condition. This sheet explains things you need to do to take care of yourself at home. To help you stay as healthy as possible, you may be asked to change your diet, take certain medicines, and make other changes in your life. Your doctor may also give you more specific instructions. If you have problems or questions, call your doctor. What are the risks? Having heart failure makes it more likely for you to have some problems. These problems can get worse if you do not take good care of yourself. Problems may include:  Blood clotting problems. This may cause a stroke.  Damage to the kidneys, liver, or lungs.  Abnormal heart rhythms. Supplies needed:  Scale for weighing yourself.  Blood pressure monitor.  Notebook.  Medicines. How to care for yourself when you have heart failure Medicines Take over-the-counter and prescription medicines only as told by your doctor. Take your medicines every day.  Do not stop taking your medicine unless your doctor tells you to do so.  Do not skip any medicines.  Get your prescriptions refilled before you run out of medicine. This is important. Eating and drinking   Eat heart-healthy foods. Talk with a diet specialist (dietitian) to create an eating plan.  Choose foods that: ? Have no trans fat. ? Are low in saturated fat and cholesterol.  Choose healthy foods, such as: ? Fresh or frozen fruits and vegetables. ? Fish. ? Low-fat (lean) meats. ? Legumes, such as beans, peas, and lentils. ? Fat-free or low-fat dairy products. ? Whole-grain foods. ? High-fiber foods.  Limit salt (sodium) if told by your doctor. Ask your diet specialist to tell you which seasonings are healthy for your heart.  Cook in healthy ways instead of frying. Healthy ways of cooking include roasting, grilling, broiling, baking, poaching, steaming, and stir-frying.  Limit how much fluid you drink, if told by your  doctor. Alcohol use  Do not drink alcohol if: ? Your doctor tells you not to drink. ? Your heart was damaged by alcohol, or you have very bad heart failure. ? You are pregnant, may be pregnant, or are planning to become pregnant.  If you drink alcohol: ? Limit how much you use to:  0-1 drink a day for women.  0-2 drinks a day for men. ? Be aware of how much alcohol is in your drink. In the [place] one drink equals one 12 oz bottle of beer (355 mL), one 5 oz glass of wine (148 mL), or one 1 oz glass of hard liquor (44 mL). Lifestyle   Do not use any products that contain nicotine or tobacco, such as cigarettes, e-cigarettes, and chewing tobacco. If you need help quitting, ask your doctor. ? Do not use nicotine gum or patches before talking to your doctor.  Do not use illegal drugs.  Lose weight if told by your doctor.  Do physical activity if told by your doctor. Talk to your doctor before you begin an exercise if: ? You are an older adult. ? You have very bad heart failure.  Learn to manage stress. If you need help, ask your doctor.  Get rehab (rehabilitation) to help you stay independent and to help with your quality of life.  Plan time to rest when you get tired. Check weight and blood pressure   Weigh yourself every day. This will help you to know if fluid is building up in your body. ? Weigh yourself every morning  after you pee (urinate) and before you eat breakfast. ? Wear the same amount of clothing each time. ? Write down your daily weight. Give your record to your doctor.  Check and write down your blood pressure as told by your doctor.  Check your pulse as told by your doctor. Dealing with very hot and very cold weather  If it is very hot: ? Avoid activities that take a lot of energy. ? Use air conditioning or fans, or find a cooler place. ? Avoid caffeine and alcohol. ? Wear clothing that is loose-fitting, lightweight, and light-colored.  If it is very  cold: ? Avoid activities that take a lot of energy. ? Layer your clothes. ? Wear mittens or gloves, a hat, and a scarf when you go outside. ? Avoid alcohol. Follow these instructions at home:  Stay up to date with shots (vaccines). Get pneumococcal and flu (influenza) shots.  Keep all follow-up visits as told by your doctor. This is important. Contact a doctor if:  You gain weight quickly.  You have increasing shortness of breath.  You cannot do your normal activities.  You get tired easily.  You cough a lot.  You don't feel like eating or feel like you may vomit (nauseous).  You become puffy (swell) in your hands, feet, ankles, or belly (abdomen).  You cannot sleep well because it is hard to breathe.  You feel like your heart is beating fast (palpitations).  You get dizzy when you stand up. Get help right away if:  You have trouble breathing.  You or someone else notices a change in your behavior, such as having trouble staying awake.  You have chest pain or discomfort.  You pass out (faint). These symptoms may be an emergency. Do not wait to see if the symptoms will go away. Get medical help right away. Call your local emergency services (911 in the [place] Do not drive yourself to the hospital. Summary  Heart failure is a serious condition. To care for yourself, you may have to change your diet, take medicines, and make other lifestyle changes.  Take your medicines every day. Do not stop taking them unless your doctor tells you to do so.  Eat heart-healthy foods, such as fresh or frozen fruits and vegetables, fish, lean meats, legumes, fat-free or low-fat dairy products, and whole-grain or high-fiber foods.  Ask your doctor if you can drink alcohol. You may have to stop alcohol use if you have very bad heart failure.  Contact your doctor if you gain weight quickly or feel that your heart is beating too fast. Get help right away if you pass out, or have chest pain  or trouble breathing. This information is not intended to replace advice given to you by your health care provider. Make sure you discuss any questions you have with your health care provider. Document Revised: 11/18/2018 Document Reviewed: 11/19/2018 Elsevier Patient Education  [employer].   Chronic Respiratory Failure  Respiratory failure is a condition in which the lungs do not work well and the breathing (respiratory) system fails. When respiratory failure occurs, it becomes difficult for the lungs to get enough oxygen or to eliminate carbon dioxide or to do both duties. If the lungs do not work properly, the heart, brain, and other body systems do not get enough oxygen. Respiratory failure is life-threatening if it is not treated. Respiratory failure can be acute or chronic. Acute respiratory failure is sudden and severe and requires emergency medical treatment.  Chronic respiratory failure happens over time, usually due to a medical condition that gets worse. What are the causes? This condition may be caused by any problem that affects the heart or lungs. Causes include:  Chronic bronchitis and emphysema (COPD).  Pulmonary fibrosis.  Water in the lungs due to heart failure, lung injury, or infection (pulmonary edema).  Asthma.  Nerve or muscle diseases that make chest movements difficult, such as [place]a [place] disease or Guillain-Barre syndrome.  A collapsed lung (pneumothorax).  Pulmonary hypertension.  Chronic sleep apnea.  Pneumonia.  Obesity.  A blood clot in a lung (pulmonary embolism).  Trauma to the chest that makes breathing difficult. What increases the risk? You are more likely to develop this condition if:  You are a smoker, or have a history of smoking.  You have a weak immune system.  You have a family history of breathing problems or lung disease.  You have a long term lung disease such as COPD. What are the signs or symptoms? Symptoms of this  condition include:  Shortness of breath with or without activity.  Difficulty breathing.  Wheezing.  A fast or irregular heartbeat (arrhythmia).  Chest pain or tightness.  A bluish color to the fingernail or toenail beds (cyanosis).  Confusion.  Drowsiness.  Extreme fatigue, especially with minimal activity. How is this diagnosed? This condition may be diagnosed based on:  Your medical history.  A physical exam.  Other tests, such as: ? A chest X-ray. ? A CT scan of your lungs. ? Blood tests, such as an arterial blood gas test. This test is done to check if you have enough oxygen in your blood. ? An electrocardiogram. This test records the electrical activity of your heart. ? An echocardiogram. This test uses sound waves to produce an image of your heart.  A check of your blood pressure, heart rate, breathing rate, and blood oxygen level. How is this treated? Treatment for this condition depends on the cause. Treatment can include the following:  Getting oxygen through a nasal cannula. This is a tube that goes in your nose.  Getting oxygen through a face mask.  Receiving noninvasive positive pressure ventilation. This is a method of breathing support in which a machine blows air into your lungs through a mask. The machine allows you to breathe on your own. It helps the body take in oxygen and eliminate carbon dioxide.  Using a ventilator. This is a breathing machine that delivers oxygen to the lungs through a breathing tube that is put into the trachea. This machine is used when you can no longer breathe well enough on your own.  Medicines to help with breathing, such as: ? Medicines that open up and relax air passages, such as bronchodilators. These may be given through a device that turns liquid medicines into a mist you can breathe in (nebulizer). These medicines help with breathing. ? Diuretics. These medicines get rid of extra fluid out of your lungs, which can  help you breathe better. ? Steroid medicines. These decrease inflammation in the lungs. ? Antibiotic medicines. These may be given to treat a bacterial infection, such as pneumonia.  Pulmonary rehabilitation. This is an exercise program that strengthens the muscles in your chest and helps you learn breathing techniques in order to manage your condition. Follow these instructions at home: Medicines  Take over-the-counter and prescription medicines only as told by your health care provider.  If you were prescribed an antibiotic medicine, take it as told by  your health care provider. Do not stop taking the antibiotic even if you start to feel better. General instructions  Use oxygen therapy and pulmonary rehabilitation if directed to by your health care provider. If you require home oxygen therapy, ask your health care provider whether you should purchase a pulse oximeter to measure your oxygen level at home.  Work with your health care provider to create a plan to help you deal with your condition. Follow this plan.  Do not use any products that contain nicotine or tobacco, such as cigarettes and e-cigarettes. If you need help quitting, ask your health care provider.  Avoid exposure to irritants that make your breathing problems worse. These include smoke, chemicals, and fumes.  Stay active, but balance activity with periods of rest. Exercise and physical activity will help you maintain your ability to do things you want to do.  Stay up to date on all vaccines, especially yearly influenza and pneumonia vaccines.  Avoid people who are sick as well as crowded places during the flu season.  Keep all follow-up visits as told by your health care provider. This is important. Contact a health care provider if:  Your shortness of breath gets worse and you cannot do the things you used to do.  You have increased mucus (sputum), wheezing, coughing, or loss of energy.  You are on oxygen therapy  and you are starting to need more.  You need to use your medicines more often.  You have a fever. Get help right away if:  Your shortness of breath becomes worse.  You are unable to say more than a few words without having to catch your breath.  You develop chest pain or tightness. Summary  Respiratory failure is a condition in which the lungs do not work well and the breathing system fails.  This condition can be very serious and is often life-threatening.  This condition is diagnosed with tests and can be treated with medicines or oxygen.  Contact a health care provider if your shortness of breath gets worse or if you need to use your oxygen or medicines more often than before. This information is not intended to replace advice given to you by your health care provider. Make sure you discuss any questions you have with your health care provider. Document Revised: 07/19/2017 Document Reviewed: 08/17/2016 Elsevier Patient Education  2020 [employer].

## 2020-03-25 NOTE — TOC Transition Note (Signed)
Transition of Care [facility]) - CM/SW Discharge Note   Patient Details  Name: Stephanie Collins MRN: [id number] Date of Birth: [birth year]  [facility] CM/SW Contact:  Meriel Flavors, LCSW Phone Number: 03/25/2020, 2:04 PM   Clinical Narrative:    Patient will need oxygen at home. CSW spoke with [place] with [employer] for referral, He is on the way and will deliver to patient's room.      Barriers to Discharge: Continued Medical Work up   Patient Goals and CMS Choice        Discharge Placement                       Discharge Plan and Services   Discharge Planning Services: CM Consult                                 Social Determinants of Health (SDOH) Interventions     Readmission Risk Interventions No flowsheet data found.

## 2020-03-31 ENCOUNTER — Other Ambulatory Visit
Admission: RE | Admit: 2020-03-31 | Discharge: 2020-03-31 | Disposition: A | Discharge status: Home / Self Care | Payer: Medicare Other | Source: Ambulatory Visit | Attending: Rehabilitative and Restorative Service Providers" | Admitting: Rehabilitative and Restorative Service Providers"

## 2020-03-31 DIAGNOSIS — I10 Essential (primary) hypertension: Secondary | ICD-10-CM | POA: Diagnosis present

## 2020-03-31 DIAGNOSIS — J9621 Acute and chronic respiratory failure with hypoxia: Secondary | ICD-10-CM | POA: Diagnosis present

## 2020-03-31 DIAGNOSIS — I5031 Acute diastolic (congestive) heart failure: Secondary | ICD-10-CM | POA: Insufficient documentation

## 2020-03-31 LAB — BRAIN NATRIURETIC PEPTIDE: B Natriuretic Peptide: 659.7 pg/mL — ABNORMAL HIGH (ref 0.0–100.0)

## 2020-04-06 ENCOUNTER — Telehealth: Payer: Self-pay | Admitting: Family

## 2020-04-06 NOTE — Telephone Encounter (Signed)
LVM for patient regarding her new patient CHF Clinic appointment that was made after her recent hospital discharge and wanted to follow up with patient on how she was doing since back home but was unable to reach.   Eytan Carrigan, NT

## 2020-04-12 ENCOUNTER — Ambulatory Visit: Payer: Medicare Other | Admitting: Family

## 2020-04-29 ENCOUNTER — Emergency Department: Payer: Medicare Other

## 2020-04-29 ENCOUNTER — Inpatient Hospital Stay
Admission: EM | Admit: 2020-04-29 | Discharge: 2020-05-02 | DRG: 291 | Disposition: A | Discharge status: Home / Self Care | Payer: Medicare Other | Attending: Internal Medicine | Admitting: Internal Medicine

## 2020-04-29 ENCOUNTER — Encounter: Payer: Self-pay | Admitting: Emergency Medicine

## 2020-04-29 ENCOUNTER — Other Ambulatory Visit: Payer: Self-pay

## 2020-04-29 DIAGNOSIS — I130 Hypertensive heart and chronic kidney disease with heart failure and stage 1 through stage 4 chronic kidney disease, or unspecified chronic kidney disease: Principal | ICD-10-CM | POA: Diagnosis present

## 2020-04-29 DIAGNOSIS — Z79899 Other long term (current) drug therapy: Secondary | ICD-10-CM

## 2020-04-29 DIAGNOSIS — D72829 Elevated white blood cell count, unspecified: Secondary | ICD-10-CM | POA: Diagnosis not present

## 2020-04-29 DIAGNOSIS — M810 Age-related osteoporosis without current pathological fracture: Secondary | ICD-10-CM | POA: Diagnosis present

## 2020-04-29 DIAGNOSIS — Z7951 Long term (current) use of inhaled steroids: Secondary | ICD-10-CM

## 2020-04-29 DIAGNOSIS — J441 Chronic obstructive pulmonary disease with (acute) exacerbation: Secondary | ICD-10-CM | POA: Diagnosis present

## 2020-04-29 DIAGNOSIS — E871 Hypo-osmolality and hyponatremia: Secondary | ICD-10-CM | POA: Diagnosis present

## 2020-04-29 DIAGNOSIS — R0602 Shortness of breath: Secondary | ICD-10-CM

## 2020-04-29 DIAGNOSIS — K449 Diaphragmatic hernia without obstruction or gangrene: Secondary | ICD-10-CM

## 2020-04-29 DIAGNOSIS — E43 Unspecified severe protein-calorie malnutrition: Secondary | ICD-10-CM | POA: Insufficient documentation

## 2020-04-29 DIAGNOSIS — J449 Chronic obstructive pulmonary disease, unspecified: Secondary | ICD-10-CM | POA: Diagnosis present

## 2020-04-29 DIAGNOSIS — N183 Chronic kidney disease, stage 3 unspecified: Secondary | ICD-10-CM | POA: Diagnosis present

## 2020-04-29 DIAGNOSIS — Z85118 Personal history of other malignant neoplasm of bronchus and lung: Secondary | ICD-10-CM

## 2020-04-29 DIAGNOSIS — I5033 Acute on chronic diastolic (congestive) heart failure: Secondary | ICD-10-CM | POA: Diagnosis not present

## 2020-04-29 DIAGNOSIS — Z885 Allergy status to narcotic agent status: Secondary | ICD-10-CM

## 2020-04-29 DIAGNOSIS — E785 Hyperlipidemia, unspecified: Secondary | ICD-10-CM | POA: Diagnosis present

## 2020-04-29 DIAGNOSIS — Z20822 Contact with and (suspected) exposure to covid-19: Secondary | ICD-10-CM | POA: Diagnosis present

## 2020-04-29 DIAGNOSIS — Z902 Acquired absence of lung [part of]: Secondary | ICD-10-CM

## 2020-04-29 DIAGNOSIS — J9621 Acute and chronic respiratory failure with hypoxia: Secondary | ICD-10-CM | POA: Diagnosis present

## 2020-04-29 DIAGNOSIS — Z87891 Personal history of nicotine dependence: Secondary | ICD-10-CM

## 2020-04-29 DIAGNOSIS — N1831 Chronic kidney disease, stage 3a: Secondary | ICD-10-CM | POA: Diagnosis present

## 2020-04-29 DIAGNOSIS — H9190 Unspecified hearing loss, unspecified ear: Secondary | ICD-10-CM | POA: Diagnosis present

## 2020-04-29 DIAGNOSIS — M1990 Unspecified osteoarthritis, unspecified site: Secondary | ICD-10-CM | POA: Diagnosis present

## 2020-04-29 DIAGNOSIS — I509 Heart failure, unspecified: Secondary | ICD-10-CM

## 2020-04-29 LAB — BASIC METABOLIC PANEL
Anion gap: 9 (ref 5–15)
BUN: 24 mg/dL — ABNORMAL HIGH (ref 8–23)
CO2: 25 mmol/L (ref 22–32)
Calcium: 8.9 mg/dL (ref 8.9–10.3)
Chloride: 91 mmol/L — ABNORMAL LOW (ref 98–111)
Creatinine, Ser: 1.19 mg/dL — ABNORMAL HIGH (ref 0.44–1.00)
GFR calc Af Amer: 48 mL/min — ABNORMAL LOW (ref >60)
GFR calc non Af Amer: 41 mL/min — ABNORMAL LOW (ref >60)
Glucose, Bld: 124 mg/dL — ABNORMAL HIGH (ref 70–99)
Potassium: 4.2 mmol/L (ref 3.5–5.1)
Sodium: 125 mmol/L — ABNORMAL LOW (ref 135–145)

## 2020-04-29 LAB — SARS CORONAVIRUS 2 BY RT PCR (HOSPITAL ORDER, PERFORMED IN ~~LOC~~ HOSPITAL LAB): SARS Coronavirus 2: NEGATIVE

## 2020-04-29 LAB — CBC
HCT: 33.4 % — ABNORMAL LOW (ref 36.0–46.0)
Hemoglobin: 11.5 g/dL — ABNORMAL LOW (ref 12.0–15.0)
MCH: 27.3 pg (ref 26.0–34.0)
MCHC: 34.4 g/dL (ref 30.0–36.0)
MCV: 79.3 fL — ABNORMAL LOW (ref 80.0–100.0)
Platelets: 292 10*3/uL (ref 150–400)
RBC: 4.21 MIL/uL (ref 3.87–5.11)
RDW: 15.1 % (ref 11.5–15.5)
WBC: 10.6 10*3/uL — ABNORMAL HIGH (ref 4.0–10.5)
nRBC: 0.2 % (ref 0.0–0.2)

## 2020-04-29 LAB — TROPONIN I (HIGH SENSITIVITY)
Troponin I (High Sensitivity): 19 ng/L — ABNORMAL HIGH (ref <18)
Troponin I (High Sensitivity): 18 ng/L — ABNORMAL HIGH (ref <18)

## 2020-04-29 LAB — BRAIN NATRIURETIC PEPTIDE: B Natriuretic Peptide: 2180.6 pg/mL — ABNORMAL HIGH (ref 0.0–100.0)

## 2020-04-29 MED ORDER — FUROSEMIDE 10 MG/ML IJ SOLN
60.0000 mg | Freq: Once | INTRAMUSCULAR | Status: AC
  Administered 2020-04-29: 60 mg via INTRAVENOUS
  Filled 2020-04-29: qty 8

## 2020-04-29 NOTE — ED Provider Notes (Signed)
[facility] Emergency Department Provider Note  ____________________________________________   I have reviewed the triage vital signs and the nursing notes.   HISTORY  Chief Complaint Shortness of Breath   History limited by and level 5 caveat due to: Poor memory/historian   HPI Stephanie Collins is a 86 y.o. female who presents to the emergency department today because of concern for shortness of breath and wheezing.  It does sound like the symptoms have been present for the past few days.  The patient denies any associated chest pain.  She states she had been on Lasix but was recently taken off of it.  Is unclear how long ago the exactly was.  Patient states she has not noticed any swelling in her legs.  She has not had any fevers.   Records reviewed. Per medical record review patient has a history of recent admission for fluid overload and shortness of breath.  Past Medical History:  Diagnosis Date  . Cancer ([place]) 2010   lung  . Cough 01/19/2016  . Hypertension   . Lung cancer ([place]) 01/19/2016    Patient Active Problem List   Diagnosis Date Noted  . Acute on chronic respiratory failure with hypoxia ([place]) 03/25/2020  . Acute CHF (congestive heart failure) ([place]) 03/22/2020  . Leg edema   . Cough 01/19/2016  . Malignant neoplasm of lower lobe, right bronchus or lung ([place]) 01/19/2016  . Chronic obstructive pulmonary disease ([place]) 07/28/2015  . H/O malignant neoplasm 07/28/2015  . BP (high blood pressure) 07/28/2015  . Arthritis, degenerative 07/28/2015  . OP (osteoporosis) 07/28/2015  . Abnormal loss of weight 04/26/2015  . 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 BREAST BIOPSY Left 2011   benign  . KYPHOPLASTY N/A 02/01/2015   Procedure: KYPHOPLASTY;  Surgeon: Hessie Knows, MD;  Location: [facility] ORS;  Service: Orthopedics;  Laterality: N/A;  T10    Prior to Admission medications   Medication Sig Start  Date End Date Taking? Authorizing Provider  albuterol (VENTOLIN HFA) 108 (90 Base) MCG/ACT inhaler Inhale 2 puffs into the lungs every 4 (four) hours as needed for wheezing or shortness of breath. 01/04/20   [provider]  carvedilol (COREG) 3.125 MG tablet Take 1 tablet (3.125 mg total) by mouth 2 (two) times daily with a meal. 03/25/20   Jennye Boroughs, MD  Fluticasone-Salmeterol (ADVAIR) 250-50 MCG/DOSE AEPB Inhale 1 puff into the lungs 2 (two) times daily.    [provider]  lisinopril (ZESTRIL) 2.5 MG tablet Take 1 tablet (2.5 mg total) by mouth daily. 03/26/20   Jennye Boroughs, MD    Allergies Tramadol  Family History  Problem Relation Age of Onset  . Breast cancer Neg Hx     Social History Social History   Tobacco Use  . Smoking status: Former Smoker  Substance Use Topics  . Alcohol use: No    Alcohol/week: 0.0 standard drinks  . Drug use: No    Review of Systems Constitutional: No fever/chills Eyes: No visual changes. ENT: No sore throat. Cardiovascular: Denies chest pain. Respiratory: Positive for shortness of breath. Gastrointestinal: No abdominal pain.  No nausea, no vomiting.  No diarrhea.   Genitourinary: Negative for dysuria. Musculoskeletal: Negative for back pain. Skin: Negative for rash. Neurological: Negative for headaches, focal weakness or numbness.  ____________________________________________   PHYSICAL EXAM:  VITAL SIGNS: ED Triage Vitals  Enc Vitals Group     BP 04/29/20 1836 (!) 141/83  Pulse Rate 04/29/20 1836 79     Resp 04/29/20 1836 20     Temp 04/29/20 1836 98.2 F (36.8 C)     Temp Source 04/29/20 1836 Oral     SpO2 04/29/20 1836 96 %     Weight 04/29/20 1840 102 lb 1.2 oz (46.3 kg)     Height 04/29/20 1840 5' (1.524 m)     Head Circumference --      Peak Flow --      Pain Score 04/29/20 1839 0   Constitutional: Alert and oriented.  Eyes: Conjunctivae are normal.  ENT      Head: Normocephalic and  atraumatic.      Nose: No congestion/rhinnorhea.      Mouth/Throat: Mucous membranes are moist.      Neck: No stridor. Hematological/Lymphatic/Immunilogical: No cervical lymphadenopathy. Cardiovascular: Normal rate, regular rhythm.  No murmurs, rubs, or gallops. Respiratory: Increased work of breathing. Wheezing diffusely.  Gastrointestinal: Soft and non tender. No rebound. No guarding.  Genitourinary: Deferred Musculoskeletal: Normal range of motion in all extremities. No lower extremity edema. Neurologic:  Normal speech and language. No gross focal neurologic deficits are appreciated.  Skin:  Skin is warm, dry and intact. No rash noted. Psychiatric: Mood and affect are normal. Speech and behavior are normal. Patient exhibits appropriate insight and judgment.  ____________________________________________    LABS (pertinent positives/negatives)  BNP 2180.6 Trop hs 19 CBC wbc 10.6, hgb 11.5, plt 292 BMP na 125, k 4.2, cl 91, glu 124, cr 1.19  ____________________________________________   EKG  I, Nance Pear, attending physician, personally viewed and interpreted this EKG  EKG Time: 1845 Rate: 71 Rhythm: normal sinus rhythm Axis: normal  Intervals: qtc 439 QRS: narrow ST changes: no st elevation, t wave inversion v3, v4 Impression: abnormal ekg   ____________________________________________    RADIOLOGY  CXR Cardiomegaly, pulmonary edema  ____________________________________________   PROCEDURES  Procedures  ____________________________________________   INITIAL IMPRESSION / ASSESSMENT AND PLAN / ED COURSE  Pertinent labs & imaging results that were available during my care of the patient were reviewed by me and considered in my medical decision making (see chart for details).   Patient presented to the emergency department today because of concerns for shortness of breath and some wheezing.  Patient did have recent history of CHF diagnosis and  admission for fluid overload.  Patient's work-up is concerning for pulmonary edema and CHF again today.  Given that she does have increased work of breathing will plan on admission for further work up and management. Will give IV lasix.   ____________________________________________   FINAL CLINICAL IMPRESSION(S) / ED DIAGNOSES  Final diagnoses:  Shortness of breath  Congestive heart failure, unspecified HF chronicity, unspecified heart failure type [facility])     Note: This dictation was prepared with Dragon dictation. Any transcriptional errors that result from this process are unintentional     Nance Pear, MD 04/29/20 2131

## 2020-04-29 NOTE — H&P (Signed)
History and Physical   Stephanie Collins:[id number] DOB: 08-05-1932 DOA: 04/29/2020  Referring MD/NP/PA: Dr. Archie Balboa  PCP: Baxter Hire, MD   Outpatient Specialists: [facility]  Patient coming from: Home  Chief Complaint: Shortness of breath  HPI: Stephanie Collins is a 86 y.o. female with medical history significant of COPD, lung cancer status post right lower lobectomy, hyperlipidemia, hypertension, osteoarthritis, CHF, hyperlipidemia and osteoarthritis who was recently admitted on August 3 with acute exacerbation of her CHF.  At that time she had an echo showed EF 70 to 75% consistent with findings of diastolic dysfunction.  Patient was evaluated and placed on Lasix and other cardiac medications.  She also follows up with cardiology.  Patient came in today with progressive shortness of breath cough and hypoxia.  She was also showing some wheeze.  No chest pain.  No hemoptysis,.  Patient was given Lasix in the ER she has diuresed some.  It does not appear to be consistent with her previous COPD exacerbation.  Chest x-ray showed pulmonary vascular congestion with interstitial edema and small bilateral pleural effusions.  Findings therefore favored acute exacerbation of her diastolic dysfunction CHF she has been admitted for further treatment..  ED Course: Sodium 125 potassium 4.2 chloride 91 CO2 25 glucose 124.  BUN is 24 creatinine 1.19.  BNP is 2180.  Troponin XIX and then 18.  White count 10.6 and platelets 292.  COVID-19 is negative.  Chest x-ray showed cardiac enlargement with pulmonary vascular congestion, interstitial edema and small bilateral pleural effusions.  Patient being admitted with acute exacerbation of CHF  Review of Systems: As per HPI otherwise 10 point review of systems negative.    Past Medical History:  Diagnosis Date  . Cancer ([place]) 2010   lung  . Cough 01/19/2016  . Hypertension   . Lung cancer ([place]) 01/19/2016    Past Surgical History:  Procedure Laterality Date    . BREAST BIOPSY Left 2011   benign  . KYPHOPLASTY N/A 02/01/2015   Procedure: KYPHOPLASTY;  Surgeon: Hessie Knows, MD;  Location: [facility] ORS;  Service: Orthopedics;  Laterality: N/A;  T10     reports that she has quit smoking. She does not have any smokeless tobacco history on file. She reports that she does not drink alcohol and does not use drugs.  Allergies  Allergen Reactions  . Tramadol     confusion    Family History  Problem Relation Age of Onset  . Breast cancer Neg Hx      Prior to Admission medications   Medication Sig Start Date End Date Taking? Authorizing Provider  albuterol (VENTOLIN HFA) 108 (90 Base) MCG/ACT inhaler Inhale 2 puffs into the lungs every 4 (four) hours as needed for wheezing or shortness of breath. 01/04/20   [provider]  carvedilol (COREG) 3.125 MG tablet Take 1 tablet (3.125 mg total) by mouth 2 (two) times daily with a meal. 03/25/20   Jennye Boroughs, MD  Fluticasone-Salmeterol (ADVAIR) 250-50 MCG/DOSE AEPB Inhale 1 puff into the lungs 2 (two) times daily.    [provider]  lisinopril (ZESTRIL) 2.5 MG tablet Take 1 tablet (2.5 mg total) by mouth daily. 03/26/20   Jennye Boroughs, MD    Physical Exam: Vitals:   04/29/20 1836 04/29/20 1840 04/29/20 2030  BP: (!) 141/83  (!) 173/91  Pulse: 79  77  Resp: 20  20  Temp: 98.2 F (36.8 C)    TempSrc: Oral    SpO2: 96%  100%  Weight:  46.3 kg   Height:  5' (1.524 m)       Constitutional: Acutely ill looking, anxious Vitals:   04/29/20 1836 04/29/20 1840 04/29/20 2030  BP: (!) 141/83  (!) 173/91  Pulse: 79  77  Resp: 20  20  Temp: 98.2 F (36.8 C)    TempSrc: Oral    SpO2: 96%  100%  Weight:  46.3 kg   Height:  5' (1.524 m)    Eyes: PERRL, lids and conjunctivae normal ENMT: Mucous membranes are moist. Posterior pharynx clear of any exudate or lesions.Normal dentition.  Neck: normal, supple, no masses, no thyromegaly Respiratory: Decreased air entry bilaterally with  some mild expiratory wheezing and crackles. Normal respiratory effort. No accessory muscle use.  Cardiovascular: Regular rate and rhythm, no murmurs / rubs / gallops. No extremity edema. 2+ pedal pulses. No carotid bruits.  Abdomen: no tenderness, no masses palpated. No hepatosplenomegaly. Bowel sounds positive.  Musculoskeletal: no clubbing / cyanosis. No joint deformity upper and lower extremities. Good ROM, no contractures. Normal muscle tone.  Skin: no rashes, lesions, ulcers. No induration Neurologic: CN 2-12 grossly intact. Sensation intact, DTR normal. Strength 5/5 in all 4.  Hard of hearing Psychiatric: Normal judgment and insight. Alert and oriented x 3.  Anxious mood.     Labs on Admission: I have personally reviewed following labs and imaging studies  CBC: Recent Labs  Lab 04/29/20 1842  WBC 10.6*  HGB 11.5*  HCT 33.4*  MCV 79.3*  PLT 937   Basic Metabolic Panel: Recent Labs  Lab 04/29/20 1842  NA 125*  K 4.2  CL 91*  CO2 25  GLUCOSE 124*  BUN 24*  CREATININE 1.19*  CALCIUM 8.9   GFR: Estimated Creatinine Clearance: 24.4 mL/min (A) (by C-G formula based on SCr of 1.19 mg/dL (H)). Liver Function Tests: No results for input(s): AST, ALT, ALKPHOS, BILITOT, PROT, ALBUMIN in the last 168 hours. No results for input(s): LIPASE, AMYLASE in the last 168 hours. No results for input(s): AMMONIA in the last 168 hours. Coagulation Profile: No results for input(s): INR, PROTIME in the last 168 hours. Cardiac Enzymes: No results for input(s): CKTOTAL, CKMB, CKMBINDEX, TROPONINI in the last 168 hours. BNP (last 3 results) No results for input(s): PROBNP in the last 8760 hours. HbA1C: No results for input(s): HGBA1C in the last 72 hours. CBG: No results for input(s): GLUCAP in the last 168 hours. Lipid Profile: No results for input(s): CHOL, HDL, LDLCALC, TRIG, CHOLHDL, LDLDIRECT in the last 72 hours. Thyroid Function Tests: No results for input(s): TSH, T4TOTAL,  FREET4, T3FREE, THYROIDAB in the last 72 hours. Anemia Panel: No results for input(s): VITAMINB12, FOLATE, FERRITIN, TIBC, IRON, RETICCTPCT in the last 72 hours. Urine analysis: No results found for: COLORURINE, APPEARANCEUR, LABSPEC, PHURINE, GLUCOSEU, HGBUR, BILIRUBINUR, KETONESUR, PROTEINUR, UROBILINOGEN, NITRITE, LEUKOCYTESUR Sepsis Labs: @LABRCNTIP (procalcitonin:4,lacticidven:4) ) Recent Results (from the past 240 hour(s))  SARS Coronavirus 2 by RT PCR (hospital order, performed in [facility] hospital lab) Nasopharyngeal Nasopharyngeal Swab     Status: None   Collection Time: 04/29/20  8:47 PM   Specimen: Nasopharyngeal Swab  Result Value Ref Range Status   SARS Coronavirus 2 NEGATIVE NEGATIVE Final    Comment: (NOTE) SARS-CoV-2 target nucleic acids are NOT DETECTED.  The SARS-CoV-2 RNA is generally detectable in upper and lower respiratory specimens during the acute phase of infection. The lowest concentration of SARS-CoV-2 viral copies this assay can detect is 250 copies / mL. A negative result does  not preclude SARS-CoV-2 infection and should not be used as the sole basis for treatment or other patient management decisions.  A negative result may occur with improper specimen collection / handling, submission of specimen other than nasopharyngeal swab, presence of viral mutation(s) within the areas targeted by this assay, and inadequate number of viral copies (<250 copies / mL). A negative result must be combined with clinical observations, patient history, and epidemiological information.  Fact Sheet for Patients:   StrictlyIdeas.no  Fact Sheet for Healthcare Providers: BankingDealers.co.za  This test is not yet approved or  cleared by the [place] FDA and has been authorized for detection and/or diagnosis of SARS-CoV-2 by FDA under an Emergency Use Authorization (EUA).  This EUA will remain in effect (meaning this test  can be used) for the duration of the COVID-19 declaration under Section 564(b)(1) of the Act, 21 U.S.C. section 360bbb-3(b)(1), unless the authorization is terminated or revoked sooner.  Performed at [facility], [address], [place], [place] [address]      Radiological Exams on Admission: DG Chest 2 View  Result Date: 04/29/2020 CLINICAL DATA:  Shortness of breath. Cough. History of COPD and CHF. EXAM: CHEST - 2 VIEW COMPARISON:  03/22/2020 FINDINGS: Cardiac enlargement. Central pulmonary vascular congestion. Diffuse interstitial pattern to the lungs may represent a combination of fibrosis and edema. Mild bronchiectasis. Small bilateral pleural effusions. Bilateral basilar atelectasis. Appearances are similar to prior study. Calcification of the aorta. Diffuse degenerative change throughout the spine with multiple vertebral compression deformities, some post kyphoplasty. IMPRESSION: Cardiac enlargement with pulmonary vascular congestion, interstitial edema, and small bilateral pleural effusions. Bilateral basilar atelectasis. Electronically Signed   By: Lucienne Capers M.D.   On: 04/29/2020 19:11    EKG: Independently reviewed.  Shows sinus rhythm with flipped T waves on the lateral leads.  Appears unchanged from previous.  Assessment/Plan Principal Problem:   Acute exacerbation of CHF (congestive heart failure) (HCC) Active Problems:   Chronic obstructive pulmonary disease (HCC)   Bergmann's syndrome   OP (osteoporosis)   Acute on chronic respiratory failure with hypoxia (HCC)   Hyponatremia   CKD (chronic kidney disease), stage III     #1 acute exacerbation of diastolic CHF: Patient will be admitted for observation.  IV diuresis gently.  Monitor response and INR.  Has had recent echocardiogram so no echo at this point.  Continue close monitoring.  #2 COPD: Mild wheezing but does not appear to be COPD exacerbation.  Continue breathing treatment on home regimen.  #3  uncontrolled hypertension: Resume home regimen and adjust accordingly.  #4 chronic kidney disease stage III: Appears to be close to baseline.  Continue close monitoring.  #5 hard of hearing: Communication will need to be optimized.  #6 osteoarthritis: Again appears to be at baseline.  Continue close monitoring.  #7 hyperlipidemia: Resume home regimen.  #8 hyponatremia: Cause not clear.  May be related to some type of salt depletion.  Sodium was fine when she was admitted a month ago.  SIADH less likely.  Continue diuresis and monitor sodium level.  #9 history of lung cancer: Status post previous surgery.  Continue monitor   DVT prophylaxis: Lovenox Code Status: Full code Family Communication: No family at bedside Disposition Plan: To be determined Consults called: None Admission status: Observation  Severity of Illness: The appropriate patient status for this patient is OBSERVATION. Observation status is judged to be reasonable and necessary in order to provide the required intensity of service to ensure the  patient's safety. The patient's presenting symptoms, physical exam findings, and initial radiographic and laboratory data in the context of their medical condition is felt to place them at decreased risk for further clinical deterioration. Furthermore, it is anticipated that the patient will be medically stable for discharge from the hospital within 2 midnights of admission. The following factors support the patient status of observation.   " The patient's presenting symptoms include shortness of breath and cough. " The physical exam findings include coarse breath sound bilaterally with some wheezing. " The initial radiographic and laboratory data are consistent with pulmonary edema.     Barbette Merino MD [facility] Pager [phone]  If 7PM-7AM, please contact night-coverage [email or web address] Password [facility]  04/29/2020, 10:52 PM

## 2020-04-29 NOTE — ED Triage Notes (Signed)
Reference first nurse note. Pt states doctor stopped lasix because she did not have edema in legs anymore. Pt respirations labored at this time. Hx of COPD. Pt states SOB started this morning.

## 2020-04-29 NOTE — ED Notes (Signed)
Pt assisted on and off of bed pan by this RN. Pt urinated x2. Purewick placed and explained to pt how it works.

## 2020-04-29 NOTE — ED Triage Notes (Addendum)
First nurse note- here for [facility].  Hx COPD/CHF. Has not had her lasix in one week because doctor took her off it.  Has cough.  sats in upper 80s with fire dept on her 2 L [place].  On 3 L [place] 95% with EMS.  Also having some confusion per EMS according to family. PIV by EMS

## 2020-04-29 NOTE — ED Notes (Signed)
Pt provided with warm blankets.  664mL clear, yellow urine obtained from purewick canister.

## 2020-04-30 ENCOUNTER — Observation Stay
Admit: 2020-04-30 | Discharge: 2020-04-30 | Disposition: A | Discharge status: Home / Self Care | Payer: Medicare Other | Attending: Internal Medicine | Admitting: Internal Medicine

## 2020-04-30 DIAGNOSIS — I5033 Acute on chronic diastolic (congestive) heart failure: Secondary | ICD-10-CM | POA: Diagnosis present

## 2020-04-30 DIAGNOSIS — Z885 Allergy status to narcotic agent status: Secondary | ICD-10-CM | POA: Diagnosis not present

## 2020-04-30 DIAGNOSIS — D72829 Elevated white blood cell count, unspecified: Secondary | ICD-10-CM | POA: Diagnosis not present

## 2020-04-30 DIAGNOSIS — J9621 Acute and chronic respiratory failure with hypoxia: Secondary | ICD-10-CM | POA: Diagnosis present

## 2020-04-30 DIAGNOSIS — E785 Hyperlipidemia, unspecified: Secondary | ICD-10-CM | POA: Diagnosis present

## 2020-04-30 DIAGNOSIS — R0602 Shortness of breath: Secondary | ICD-10-CM | POA: Diagnosis present

## 2020-04-30 DIAGNOSIS — Z20822 Contact with and (suspected) exposure to covid-19: Secondary | ICD-10-CM | POA: Diagnosis present

## 2020-04-30 DIAGNOSIS — Z87891 Personal history of nicotine dependence: Secondary | ICD-10-CM | POA: Diagnosis not present

## 2020-04-30 DIAGNOSIS — J449 Chronic obstructive pulmonary disease, unspecified: Secondary | ICD-10-CM | POA: Diagnosis present

## 2020-04-30 DIAGNOSIS — J441 Chronic obstructive pulmonary disease with (acute) exacerbation: Secondary | ICD-10-CM | POA: Diagnosis present

## 2020-04-30 DIAGNOSIS — N1831 Chronic kidney disease, stage 3a: Secondary | ICD-10-CM | POA: Diagnosis present

## 2020-04-30 DIAGNOSIS — H9190 Unspecified hearing loss, unspecified ear: Secondary | ICD-10-CM | POA: Diagnosis present

## 2020-04-30 DIAGNOSIS — Z7951 Long term (current) use of inhaled steroids: Secondary | ICD-10-CM | POA: Diagnosis not present

## 2020-04-30 DIAGNOSIS — I509 Heart failure, unspecified: Secondary | ICD-10-CM

## 2020-04-30 DIAGNOSIS — Z902 Acquired absence of lung [part of]: Secondary | ICD-10-CM | POA: Diagnosis not present

## 2020-04-30 DIAGNOSIS — Z79899 Other long term (current) drug therapy: Secondary | ICD-10-CM | POA: Diagnosis not present

## 2020-04-30 DIAGNOSIS — M1990 Unspecified osteoarthritis, unspecified site: Secondary | ICD-10-CM | POA: Diagnosis present

## 2020-04-30 DIAGNOSIS — I130 Hypertensive heart and chronic kidney disease with heart failure and stage 1 through stage 4 chronic kidney disease, or unspecified chronic kidney disease: Secondary | ICD-10-CM | POA: Diagnosis present

## 2020-04-30 DIAGNOSIS — Z85118 Personal history of other malignant neoplasm of bronchus and lung: Secondary | ICD-10-CM | POA: Diagnosis not present

## 2020-04-30 DIAGNOSIS — E871 Hypo-osmolality and hyponatremia: Secondary | ICD-10-CM | POA: Diagnosis present

## 2020-04-30 LAB — CBC WITH DIFFERENTIAL/PLATELET
Abs Immature Granulocytes: 0.07 10*3/uL (ref 0.00–0.07)
Basophils Absolute: 0 10*3/uL (ref 0.0–0.1)
Basophils Relative: 0 %
Eosinophils Absolute: 0 10*3/uL (ref 0.0–0.5)
Eosinophils Relative: 0 %
HCT: 35.2 % — ABNORMAL LOW (ref 36.0–46.0)
Hemoglobin: 12.4 g/dL (ref 12.0–15.0)
Immature Granulocytes: 1 %
Lymphocytes Relative: 9 %
Lymphs Abs: 0.9 10*3/uL (ref 0.7–4.0)
MCH: 28.6 pg (ref 26.0–34.0)
MCHC: 35.2 g/dL (ref 30.0–36.0)
MCV: 81.1 fL (ref 80.0–100.0)
Monocytes Absolute: 0.7 10*3/uL (ref 0.1–1.0)
Monocytes Relative: 7 %
Neutro Abs: 7.9 10*3/uL — ABNORMAL HIGH (ref 1.7–7.7)
Neutrophils Relative %: 83 %
Platelets: 300 10*3/uL (ref 150–400)
RBC: 4.34 MIL/uL (ref 3.87–5.11)
RDW: 15.2 % (ref 11.5–15.5)
WBC: 9.6 10*3/uL (ref 4.0–10.5)
nRBC: 0 % (ref 0.0–0.2)

## 2020-04-30 LAB — BASIC METABOLIC PANEL
Anion gap: 11 (ref 5–15)
BUN: 23 mg/dL (ref 8–23)
CO2: 28 mmol/L (ref 22–32)
Calcium: 8.8 mg/dL — ABNORMAL LOW (ref 8.9–10.3)
Chloride: 92 mmol/L — ABNORMAL LOW (ref 98–111)
Creatinine, Ser: 1 mg/dL (ref 0.44–1.00)
GFR calc Af Amer: 59 mL/min — ABNORMAL LOW (ref >60)
GFR calc non Af Amer: 51 mL/min — ABNORMAL LOW (ref >60)
Glucose, Bld: 95 mg/dL (ref 70–99)
Potassium: 3.5 mmol/L (ref 3.5–5.1)
Sodium: 131 mmol/L — ABNORMAL LOW (ref 135–145)

## 2020-04-30 LAB — ECHOCARDIOGRAM COMPLETE
AR max vel: 1.72 cm2
AV Peak grad: 4.3 mmHg
Ao pk vel: 1.04 m/s
Area-P 1/2: 2.48 cm2
Height: 60 in
S' Lateral: 3.15 cm
Weight: 1625.6 oz

## 2020-04-30 MED ORDER — LISINOPRIL 2.5 MG PO TABS
2.5000 mg | ORAL_TABLET | Freq: Every day | ORAL | Status: DC
  Administered 2020-04-30 – 2020-05-02 (×3): 2.5 mg via ORAL
  Filled 2020-04-30 (×3): qty 1

## 2020-04-30 MED ORDER — POTASSIUM CHLORIDE CRYS ER 20 MEQ PO TBCR
20.0000 meq | EXTENDED_RELEASE_TABLET | Freq: Once | ORAL | Status: AC
  Administered 2020-04-30: 20 meq via ORAL
  Filled 2020-04-30: qty 1

## 2020-04-30 MED ORDER — CARVEDILOL 6.25 MG PO TABS
3.1250 mg | ORAL_TABLET | Freq: Two times a day (BID) | ORAL | Status: DC
  Administered 2020-04-30 – 2020-05-02 (×5): 3.125 mg via ORAL
  Filled 2020-04-30 (×5): qty 1

## 2020-04-30 MED ORDER — FUROSEMIDE 10 MG/ML IJ SOLN
20.0000 mg | Freq: Two times a day (BID) | INTRAMUSCULAR | Status: DC
  Administered 2020-04-30 – 2020-05-02 (×5): 20 mg via INTRAVENOUS
  Filled 2020-04-30 (×5): qty 4

## 2020-04-30 MED ORDER — ENOXAPARIN SODIUM 30 MG/0.3ML ~~LOC~~ SOLN
30.0000 mg | SUBCUTANEOUS | Status: DC
  Administered 2020-04-30 – 2020-05-02 (×3): 30 mg via SUBCUTANEOUS
  Filled 2020-04-30 (×4): qty 0.3

## 2020-04-30 MED ORDER — ACETAMINOPHEN 325 MG PO TABS
650.0000 mg | ORAL_TABLET | ORAL | Status: DC | PRN
  Administered 2020-04-30: 650 mg via ORAL
  Filled 2020-04-30: qty 2

## 2020-04-30 MED ORDER — ONDANSETRON HCL 4 MG/2ML IJ SOLN: 4.0000 mg | Freq: Four times a day (QID) | INTRAMUSCULAR | Status: DC | PRN

## 2020-04-30 MED ORDER — SODIUM CHLORIDE 0.9 % IV SOLN: 250.0000 mL | INTRAVENOUS | Status: DC | PRN

## 2020-04-30 MED ORDER — SODIUM CHLORIDE 0.9% FLUSH
3.0000 mL | Freq: Two times a day (BID) | INTRAVENOUS | Status: DC
  Administered 2020-04-30 – 2020-05-02 (×4): 3 mL via INTRAVENOUS

## 2020-04-30 MED ORDER — SODIUM CHLORIDE 0.9% FLUSH: 3.0000 mL | INTRAVENOUS | Status: DC | PRN

## 2020-04-30 MED ORDER — POTASSIUM CHLORIDE CRYS ER 20 MEQ PO TBCR
40.0000 meq | EXTENDED_RELEASE_TABLET | Freq: Once | ORAL | Status: AC
  Administered 2020-04-30: 40 meq via ORAL
  Filled 2020-04-30: qty 2

## 2020-04-30 MED ORDER — IPRATROPIUM-ALBUTEROL 0.5-2.5 (3) MG/3ML IN SOLN
3.0000 mL | Freq: Four times a day (QID) | RESPIRATORY_TRACT | Status: DC
  Administered 2020-04-30 – 2020-05-01 (×3): 3 mL via RESPIRATORY_TRACT
  Filled 2020-04-30 (×3): qty 3

## 2020-04-30 NOTE — TOC Initial Note (Signed)
Transition of Care [facility]) - Initial/Assessment Note    Patient Details  Name: Stephanie Collins MRN: [id number] Date of Birth: Jun 15, 1932  [facility]) CM/SW Contact:    Maebelle Munroe, RN Phone Number: 04/30/2020, 11:11 AM  Clinical Narrative:                 Spoke to 86 yo female patient. She is alert and oriented x4. She is historian. Verbally responsive. She shares she is here because of shortness of breath, and heart failure. Assessment completed. Discussed possible SNF placement for rehab post discharge. Provided patient with list of SNFs in her area. Pt. Shares her desire is to return home. She currently lives alone. She has a daughter-[name] who lives near by and assists with her care. Awaiting PT evaluation for discharge planning.  Expected Discharge Plan: Skilled Nursing Facility Barriers to Discharge: No Barriers Identified   Patient Goals and CMS Choice     Choice offered to / list presented to : Patient  Expected Discharge Plan and Services Expected Discharge Plan: [place]   Discharge Planning Services: CM Consult   Living arrangements for the past 2 months: Single Family Home                                      Prior Living Arrangements/Services Living arrangements for the past 2 months: Single Family Home Lives with:: Self Patient language and need for interpreter reviewed:: Yes Do you feel safe going back to the place where you live?: Yes      Need for Family Participation in Patient Care: No (Comment) Care giver support system in place?: Yes (comment)   Criminal Activity/Legal Involvement Pertinent to Current Situation/Hospitalization: No - Comment as needed  Activities of Daily Living      Permission Sought/Granted Permission sought to share information with : Family Supports, Case Manager Permission granted to share information with : Yes, Verbal Permission Granted  Share Information with NAME: Stephanie Collins (Daughter) [phone] (Home Phone)  Permission granted to share info w AGENCY: SNF if appropriate        Emotional Assessment Appearance:: Well-Groomed, Appears stated age Attitude/Demeanor/Rapport: Engaged Affect (typically observed): Accepting, Pleasant, Happy Orientation: : Oriented to Self, Oriented to  Time, Oriented to Situation, Oriented to Place Alcohol / Substance Use: Tobacco Use (Former smoker. Quit many years ago.) Psych Involvement: No (comment)  Admission diagnosis:  Acute exacerbation of CHF (congestive heart failure) ([place]) [I50.9] Patient Active Problem List   Diagnosis Date Noted  . Acute exacerbation of CHF (congestive heart failure) ([place]) 04/29/2020  . Hyponatremia 04/29/2020  . CKD (chronic kidney disease), stage III 04/29/2020  . Acute on chronic respiratory failure with hypoxia ([place]) 03/25/2020  . Acute CHF (congestive heart failure) ([place]) 03/22/2020  . Leg edema   . Cough 01/19/2016  . Malignant neoplasm of lower lobe, right bronchus or lung (CODE) 01/19/2016  . Chronic obstructive pulmonary disease ([place]) 07/28/2015  . H/O malignant neoplasm 07/28/2015  . BP (high blood pressure) 07/28/2015  . Arthritis, degenerative 07/28/2015  . OP (osteoporosis) 07/28/2015  . Abnormal loss of weight 04/26/2015  . 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   PCP:  Baxter Hire, MD Pharmacy:   [facility] [place], [place] AT [facility] [address] [place] [address] Phone: [phone] Fax: [phone]  Social Determinants of Health (SDOH) Interventions    Readmission Risk Interventions No flowsheet data found.

## 2020-04-30 NOTE — Consult Note (Signed)
CARDIOLOGY CONSULT NOTE               Patient ID: Stephanie Collins MRN: [id number] DOB/AGE: 25-Aug-1931 86 y.o.  Admit date: 04/29/2020 Referring Physician Dr. Gala Romney hospitalist Primary Physician Dr. Harrel Lemon primary Primary Cardiologist Dr. Jordan Hawks Reason for Consultation shortness of breath dyspnea  HPI: Patient with significant COPD previous lung cancer status post right lower lobectomy hyperlipidemia hypertension CHF was admitted back in August with acute exacerbation of congestive heart failure. At that time echo showed preserved left ventricular function of 70% patient was consistent with diastolic dysfunction treated with Lasix. Patient follow-up with cardiology Dr. Ubaldo Glassing and significant shortness of breath etc. improved with therapy patient with heart failure was treated with Lasix patient's chest x-ray showed pulmonary congestion with evidence of diastolic dysfunction. Patient sodium was 125 Covid was negative patient was then admitted treated medically cardiology was consulted for further evaluation  Review of systems complete and found to be negative unless listed above     Past Medical History:  Diagnosis Date  . Cancer ([place]) 2010   lung  . Cough 01/19/2016  . Hypertension   . Lung cancer ([place]) 01/19/2016    Past Surgical History:  Procedure Laterality Date  . BREAST BIOPSY Left 2011   benign  . KYPHOPLASTY N/A 02/01/2015   Procedure: KYPHOPLASTY;  Surgeon: Hessie Knows, MD;  Location: [facility] ORS;  Service: Orthopedics;  Laterality: N/A;  T10    (Not in a hospital admission)  Social History   Socioeconomic History  . Marital status: Widowed    Spouse name: Not on file  . Number of children: Not on file  . Years of education: Not on file  . Highest education level: Not on file  Occupational History  . Not on file  Tobacco Use  . Smoking status: Former Smoker  Substance and Sexual Activity  . Alcohol use: No    Alcohol/week: 0.0 standard drinks  .  Drug use: No  . Sexual activity: Not on file  Other Topics Concern  . Not on file  Social History Narrative  . Not on file   Social Determinants of Health   Financial Resource Strain:   . Difficulty of Paying Living Expenses: Not on file  Food Insecurity:   . Worried About Charity fundraiser in the Last Year: Not on file  . Ran Out of Food in the Last Year: Not on file  Transportation Needs:   . Lack of Transportation (Medical): Not on file  . Lack of Transportation (Non-Medical): Not on file  Physical Activity:   . Days of Exercise per Week: Not on file  . Minutes of Exercise per Session: Not on file  Stress:   . Feeling of Stress : Not on file  Social Connections:   . Frequency of Communication with Friends and Family: Not on file  . Frequency of Social Gatherings with Friends and Family: Not on file  . Attends Religious Services: Not on file  . Active Member of Clubs or Organizations: Not on file  . Attends Archivist Meetings: Not on file  . Marital Status: Not on file  Intimate Partner Violence:   . Fear of Current or Ex-Partner: Not on file  . Emotionally Abused: Not on file  . Physically Abused: Not on file  . Sexually Abused: Not on file    Family History  Problem Relation Age of Onset  . Breast cancer Neg Hx  Review of systems complete and found to be negative unless listed above      PHYSICAL EXAM  General: Well developed, well nourished, in no acute distress HEENT:  Normocephalic and atramatic Neck:  No JVD.  Lungs: Clear bilaterally to auscultation and percussion. Heart: HRRR . Normal S1 and S2 without gallops or murmurs.  Abdomen: Bowel sounds are positive, abdomen soft and non-tender  Msk:  Back normal, normal gait. Normal strength and tone for age. Extremities: No clubbing, cyanosis or edema.   Neuro: Alert and oriented X 3. Psych:  Good affect, responds appropriately  Labs:   Lab Results  Component Value Date   WBC 9.6  04/30/2020   HGB 12.4 04/30/2020   HCT 35.2 (L) 04/30/2020   MCV 81.1 04/30/2020   PLT 300 04/30/2020    Recent Labs  Lab 04/30/20 0500  NA 131*  K 3.5  CL 92*  CO2 28  BUN 23  CREATININE 1.00  CALCIUM 8.8*  GLUCOSE 95   No results found for: CKTOTAL, CKMB, CKMBINDEX, TROPONINI No results found for: CHOL No results found for: HDL No results found for: LDLCALC No results found for: TRIG No results found for: CHOLHDL No results found for: LDLDIRECT    Radiology: DG Chest 2 View  Result Date: 04/29/2020 CLINICAL DATA:  Shortness of breath. Cough. History of COPD and CHF. EXAM: CHEST - 2 VIEW COMPARISON:  03/22/2020 FINDINGS: Cardiac enlargement. Central pulmonary vascular congestion. Diffuse interstitial pattern to the lungs may represent a combination of fibrosis and edema. Mild bronchiectasis. Small bilateral pleural effusions. Bilateral basilar atelectasis. Appearances are similar to prior study. Calcification of the aorta. Diffuse degenerative change throughout the spine with multiple vertebral compression deformities, some post kyphoplasty. IMPRESSION: Cardiac enlargement with pulmonary vascular congestion, interstitial edema, and small bilateral pleural effusions. Bilateral basilar atelectasis. Electronically Signed   By: Lucienne Capers M.D.   On: 04/29/2020 19:11   ECHOCARDIOGRAM COMPLETE  Result Date: 04/30/2020    ECHOCARDIOGRAM REPORT   Patient Name:   Stephanie Collins Date of Exam: 04/30/2020 Medical Rec #:  [id number]   Height:       60.0 in Accession #:    [id number]  Weight:       101.6 lb Date of Birth:  May 28, 1932   BSA:          1.400 m Patient Age:    35 years    BP:           134/70 mmHg Patient Gender: F           HR:           73 bpm. Exam Location:  [facility] Procedure: 2D Echo Indications:     CHF 428.31  History:         Patient has prior history of Echocardiogram examinations, most                  recent 03/22/2020.  Sonographer:     Arville Go RDCS Referring Phys:   [place] Diagnosing Phys: Yolonda Kida MD IMPRESSIONS  1. Left ventricular ejection fraction, by estimation, is 55 to 60%. The left ventricle has normal function. Left ventricular endocardial border not optimally defined to evaluate regional wall motion. Left ventricular diastolic parameters are consistent with Grade II diastolic dysfunction (pseudonormalization). There is the interventricular septum is flattened in diastole ('D' shaped left ventricle), consistent with right ventricular volume overload.  2. Right ventricular systolic function is moderately reduced. The right  ventricular size is moderately enlarged. There is mildly elevated pulmonary artery systolic pressure.  3. Left atrial size was mildly dilated.  4. Right atrial size was mildly dilated.  5. The mitral valve is abnormal. Mild mitral valve regurgitation.  6. The aortic valve is abnormal. Aortic valve regurgitation is not visualized. Mild aortic valve sclerosis is present, with no evidence of aortic valve stenosis. FINDINGS  Left Ventricle: Left ventricular ejection fraction, by estimation, is 55 to 60%. The left ventricle has normal function. Left ventricular endocardial border not optimally defined to evaluate regional wall motion. The left ventricular internal cavity size was normal in size. There is no left ventricular hypertrophy. The interventricular septum is flattened in diastole ('D' shaped left ventricle), consistent with right ventricular volume overload. Left ventricular diastolic parameters are consistent with Grade II diastolic dysfunction (pseudonormalization). Right Ventricle: The right ventricular size is moderately enlarged. No increase in right ventricular wall thickness. Right ventricular systolic function is moderately reduced. There is mildly elevated pulmonary artery systolic pressure. Left Atrium: Left atrial size was mildly dilated. Right Atrium: Right atrial size was mildly dilated. Pericardium: There is  no evidence of pericardial effusion. Mitral Valve: The mitral valve is abnormal. There is mild calcification of the mitral valve leaflet(s). Mild mitral annular calcification. Mild mitral valve regurgitation. Tricuspid Valve: The tricuspid valve is grossly normal. Tricuspid valve regurgitation is trivial. Aortic Valve: The aortic valve is abnormal. Aortic valve regurgitation is not visualized. Mild aortic valve sclerosis is present, with no evidence of aortic valve stenosis. Aortic valve peak gradient measures 4.3 mmHg. Pulmonic Valve: The pulmonic valve was normal in structure. Pulmonic valve regurgitation is not visualized. Aorta: Aortic root could not be assessed. IAS/Shunts: No atrial level shunt detected by color flow Doppler.  LEFT VENTRICLE PLAX 2D LVIDd:         4.47 cm  Diastology LVIDs:         3.14 cm  LV e' medial:    4.57 cm/s LV PW:         1.13 cm  LV E/e' medial:  10.9 LV IVS:        0.96 cm  LV e' lateral:   6.09 cm/s LVOT diam:     2.00 cm  LV E/e' lateral: 8.2 LV SV:         36 LV SV Index:   26 LVOT Area:     3.14 cm  RIGHT VENTRICLE RV Basal diam:  4.14 cm RV S prime:     8.59 cm/s TAPSE (M-mode): 1.7 cm LEFT ATRIUM             Index       RIGHT ATRIUM           Index LA diam:        3.80 cm 2.71 cm/m  RA Area:     23.40 cm LA Vol (A2C):   31.7 ml 22.65 ml/m RA Volume:   79.60 ml  56.87 ml/m LA Vol (A4C):   22.8 ml 16.29 ml/m LA Biplane Vol: 26.9 ml 19.22 ml/m  AORTIC VALVE AV Area (Vmax): 1.72 cm AV Vmax:        104.00 cm/s AV Peak Grad:   4.3 mmHg LVOT Vmax:      56.80 cm/s LVOT Vmean:     33.500 cm/s LVOT VTI:       0.115 m  AORTA Ao Root diam: 3.00 cm Ao Asc diam:  2.90 cm MITRAL VALVE  TRICUSPID VALVE MV Area (PHT): 2.48 cm     TV Peak grad:   38.9 mmHg MV Decel Time: 306 msec     TV Vmax:        3.12 m/s MV E velocity: 49.70 cm/s MV A velocity: 100.00 cm/s  SHUNTS MV E/A ratio:  0.50         Systemic VTI:  0.12 m                             Systemic Diam: 2.00 cm  Euan Wandler D Claron Rosencrans MD Electronically signed by Yolonda Kida MD Signature Date/Time: 04/30/2020/2:58:54 PM    Final     EKG: Normal sinus rhythm nonspecific ST-T wave changes  ASSESSMENT AND PLAN:  Acute diastolic congestive heart failure moderate COPD hypertension chronic renal insufficiency stage III hyper lipidemia . Plan agree with admit to monitoring rule out myocardial infarction follow-up EKGs supplemental oxygen and inhalers consider nephrology input for renal insufficiency DVT prophylaxis continue management for congestive heart failure agree with echocardiogram for assessment left ventricular function unremarkable have the patient follow-up with Dr. Ubaldo Glassing as an outpatient  Signed: Yolonda Kida MD 04/30/2020, 3:36 PM

## 2020-04-30 NOTE — ED Notes (Signed)
Echo at bedside

## 2020-04-30 NOTE — ED Notes (Signed)
Case management at bedside.

## 2020-04-30 NOTE — ED Notes (Signed)
Pt changed by this RN. New purewick placed. Pt repositioned in bed.

## 2020-04-30 NOTE — ED Notes (Signed)
Pt eating breakfast tray 

## 2020-04-30 NOTE — Progress Notes (Addendum)
PROGRESS NOTE    Stephanie Collins  YIF:[id number] DOB: June 28, 1932 DOA: 04/29/2020 PCP: Baxter Hire, MD   Assessment & Plan:   Principal Problem:   Acute exacerbation of CHF (congestive heart failure) ([place]) Active Problems:   Chronic obstructive pulmonary disease ([place])   Bergmann's syndrome   OP (osteoporosis)   Acute on chronic respiratory failure with hypoxia ([facility])   Hyponatremia   CKD (chronic kidney disease), stage III   Acute on chronic diastolic CHF exacerbation: continue on IV lasix. Monitor I/Os. Continue on tele. Cardio consulted    Acute on chronic hypoxic respiratory failure: likely secondary to CHF & COPD. Continue on supplemental oxygen and wean back to baseline as tolerated  COPD: w/o exacerbation. Continue on bronchodilators. Encourage incentive spirometry   HTN: uncontrolled. Continue on home dose of carvedilol, lisinopril   CKDIIIa: stable. Avoid nephrotoxic meds  OA: not on DMARDs or NSAIDs as per home med rec. Continue w/ supportive care   Hyponatremia: etiology unclear. Improving w/ diuresis. Will continue to monitor   Hx of lung cancer: management per outpatient oncology    DVT prophylaxis: lovenox Code Status: full  Family Communication: discussed pt's care w/ pt's family at bedside and answered their questions  Disposition Plan: depends on PT/OT   Consultants:      Procedures:    Antimicrobials:    Subjective: Pt c/o shortness of breath but improved from day prior  Objective: Vitals:   04/30/20 0530 04/30/20 0600 04/30/20 0733 04/30/20 0734  BP: (!) 145/73 122/65 134/70   Pulse: (!) 59 (!) 52 69   Resp: (!) 21 18 20    Temp:      TempSrc:      SpO2: 95% 96% 95%   Weight:    46.1 kg  Height:       No intake or output data in the 24 hours ending 04/30/20 0813 Filed Weights   04/29/20 1840 04/30/20 0734  Weight: 46.3 kg 46.1 kg    Examination:  General exam: Appears calm and comfortable  Respiratory system: diminished  breath sounds b/l. No wheezes, rhonchi  Cardiovascular system: S1 & S2+. No  rubs, gallops or clicks. No pedal edema. Gastrointestinal system: Abdomen is nondistended, soft and nontender.  Normal bowel sounds heard. Central nervous system: Alert and oriented. Moves all 4 extremities  Psychiatry: Judgement and insight appear normal. Mood & affect appropriate.     Data Reviewed: I have personally reviewed following labs and imaging studies  CBC: Recent Labs  Lab 04/29/20 1842 04/30/20 0500  WBC 10.6* 9.6  NEUTROABS  --  7.9*  HGB 11.5* 12.4  HCT 33.4* 35.2*  MCV 79.3* 81.1  PLT 292 867   Basic Metabolic Panel: Recent Labs  Lab 04/29/20 1842 04/30/20 0500  NA 125* 131*  K 4.2 3.5  CL 91* 92*  CO2 25 28  GLUCOSE 124* 95  BUN 24* 23  CREATININE 1.19* 1.00  CALCIUM 8.9 8.8*   GFR: Estimated Creatinine Clearance: 29 mL/min (by C-G formula based on SCr of 1 mg/dL). Liver Function Tests: No results for input(s): AST, ALT, ALKPHOS, BILITOT, PROT, ALBUMIN in the last 168 hours. No results for input(s): LIPASE, AMYLASE in the last 168 hours. No results for input(s): AMMONIA in the last 168 hours. Coagulation Profile: No results for input(s): INR, PROTIME in the last 168 hours. Cardiac Enzymes: No results for input(s): CKTOTAL, CKMB, CKMBINDEX, TROPONINI in the last 168 hours. BNP (last 3 results) No results for input(s): PROBNP in the  last 8760 hours. HbA1C: No results for input(s): HGBA1C in the last 72 hours. CBG: No results for input(s): GLUCAP in the last 168 hours. Lipid Profile: No results for input(s): CHOL, HDL, LDLCALC, TRIG, CHOLHDL, LDLDIRECT in the last 72 hours. Thyroid Function Tests: No results for input(s): TSH, T4TOTAL, FREET4, T3FREE, THYROIDAB in the last 72 hours. Anemia Panel: No results for input(s): VITAMINB12, FOLATE, FERRITIN, TIBC, IRON, RETICCTPCT in the last 72 hours. Sepsis Labs: No results for input(s): PROCALCITON, LATICACIDVEN in the last  168 hours.  Recent Results (from the past 240 hour(s))  SARS Coronavirus 2 by RT PCR (hospital order, performed in [facility] hospital lab) Nasopharyngeal Nasopharyngeal Swab     Status: None   Collection Time: 04/29/20  8:47 PM   Specimen: Nasopharyngeal Swab  Result Value Ref Range Status   SARS Coronavirus 2 NEGATIVE NEGATIVE Final    Comment: (NOTE) SARS-CoV-2 target nucleic acids are NOT DETECTED.  The SARS-CoV-2 RNA is generally detectable in upper and lower respiratory specimens during the acute phase of infection. The lowest concentration of SARS-CoV-2 viral copies this assay can detect is 250 copies / mL. A negative result does not preclude SARS-CoV-2 infection and should not be used as the sole basis for treatment or other patient management decisions.  A negative result may occur with improper specimen collection / handling, submission of specimen other than nasopharyngeal swab, presence of viral mutation(s) within the areas targeted by this assay, and inadequate number of viral copies (<250 copies / mL). A negative result must be combined with clinical observations, patient history, and epidemiological information.  Fact Sheet for Patients:   StrictlyIdeas.no  Fact Sheet for Healthcare Providers: BankingDealers.co.za  This test is not yet approved or  cleared by the [place] FDA and has been authorized for detection and/or diagnosis of SARS-CoV-2 by FDA under an Emergency Use Authorization (EUA).  This EUA will remain in effect (meaning this test can be used) for the duration of the COVID-19 declaration under Section 564(b)(1) of the Act, 21 U.S.C. section 360bbb-3(b)(1), unless the authorization is terminated or revoked sooner.  Performed at [facility], [address] [place], [place] [address]          Radiology Studies: DG Chest 2 View  Result Date: 04/29/2020 CLINICAL DATA:  Shortness of  breath. Cough. History of COPD and CHF. EXAM: CHEST - 2 VIEW COMPARISON:  03/22/2020 FINDINGS: Cardiac enlargement. Central pulmonary vascular congestion. Diffuse interstitial pattern to the lungs may represent a combination of fibrosis and edema. Mild bronchiectasis. Small bilateral pleural effusions. Bilateral basilar atelectasis. Appearances are similar to prior study. Calcification of the aorta. Diffuse degenerative change throughout the spine with multiple vertebral compression deformities, some post kyphoplasty. IMPRESSION: Cardiac enlargement with pulmonary vascular congestion, interstitial edema, and small bilateral pleural effusions. Bilateral basilar atelectasis. Electronically Signed   By: Lucienne Capers M.D.   On: 04/29/2020 19:11        Scheduled Meds: . enoxaparin (LOVENOX) injection  30 mg Subcutaneous Q24H  . furosemide  20 mg Intravenous BID  . sodium chloride flush  3 mL Intravenous Q12H   Continuous Infusions: . sodium chloride       LOS: 0 days    Time spent: 34 mins     Wyvonnia Dusky, MD [facility] Pager [phone]  If 7PM-7AM, please contact night-coverage [email or web address] 04/30/2020, 8:13 AM

## 2020-04-30 NOTE — Progress Notes (Signed)
*  PRELIMINARY RESULTS* Echocardiogram 2D Echocardiogram has been performed.  Lavell Luster Lottie Sigman 04/30/2020, 10:33 AM

## 2020-04-30 NOTE — Care Management (Signed)
TOC team- received consult. Assessment in progress.

## 2020-04-30 NOTE — ED Notes (Signed)
Pt eating meal tray 

## 2020-04-30 NOTE — ED Notes (Signed)
Pt has requested to not be woken up if she falls asleep.  Pt sleeping at this time, will collect labs when she wakes up.

## 2020-04-30 NOTE — ED Notes (Addendum)
Emptied 953mL yellow, clear, urine from suction canister.  Pt given water and ginger ale per request.

## 2020-04-30 NOTE — ED Notes (Signed)
Pt's incontinence brief changed, pt cleaned up. Dry brief and purewick placed by this RN. Pt given warm blankets, pt states her leg cramps are feeling better with the blankets.

## 2020-05-01 LAB — CBC
HCT: 37.6 % (ref 36.0–46.0)
Hemoglobin: 13.1 g/dL (ref 12.0–15.0)
MCH: 28.5 pg (ref 26.0–34.0)
MCHC: 34.8 g/dL (ref 30.0–36.0)
MCV: 81.9 fL (ref 80.0–100.0)
Platelets: 295 10*3/uL (ref 150–400)
RBC: 4.59 MIL/uL (ref 3.87–5.11)
RDW: 15.6 % — ABNORMAL HIGH (ref 11.5–15.5)
WBC: 10.7 10*3/uL — ABNORMAL HIGH (ref 4.0–10.5)
nRBC: 0 % (ref 0.0–0.2)

## 2020-05-01 LAB — BASIC METABOLIC PANEL
Anion gap: 10 (ref 5–15)
BUN: 27 mg/dL — ABNORMAL HIGH (ref 8–23)
CO2: 27 mmol/L (ref 22–32)
Calcium: 8.9 mg/dL (ref 8.9–10.3)
Chloride: 94 mmol/L — ABNORMAL LOW (ref 98–111)
Creatinine, Ser: 1.06 mg/dL — ABNORMAL HIGH (ref 0.44–1.00)
GFR calc Af Amer: 55 mL/min — ABNORMAL LOW (ref >60)
GFR calc non Af Amer: 48 mL/min — ABNORMAL LOW (ref >60)
Glucose, Bld: 94 mg/dL (ref 70–99)
Potassium: 4.4 mmol/L (ref 3.5–5.1)
Sodium: 131 mmol/L — ABNORMAL LOW (ref 135–145)

## 2020-05-01 MED ORDER — BENZONATATE 100 MG PO CAPS
200.0000 mg | ORAL_CAPSULE | Freq: Three times a day (TID) | ORAL | Status: DC
  Administered 2020-05-01 – 2020-05-02 (×3): 200 mg via ORAL
  Filled 2020-05-01 (×3): qty 2

## 2020-05-01 MED ORDER — IPRATROPIUM-ALBUTEROL 0.5-2.5 (3) MG/3ML IN SOLN
3.0000 mL | Freq: Three times a day (TID) | RESPIRATORY_TRACT | Status: DC
  Administered 2020-05-01 – 2020-05-02 (×3): 3 mL via RESPIRATORY_TRACT
  Filled 2020-05-01 (×3): qty 3

## 2020-05-01 MED ORDER — BUDESONIDE 0.5 MG/2ML IN SUSP
0.5000 mg | Freq: Two times a day (BID) | RESPIRATORY_TRACT | Status: DC
  Administered 2020-05-01 – 2020-05-02 (×3): 0.5 mg via RESPIRATORY_TRACT
  Filled 2020-05-01 (×3): qty 2

## 2020-05-01 NOTE — Evaluation (Signed)
Occupational Therapy Evaluation Patient Details Name: Stephanie Collins MRN: [id number] DOB: [birth year] Today's Date: 05/01/2020    History of Present Illness Stephanie Collins is a 86 y.o. female with medical history significant of COPD, lung cancer status post right lower lobectomy, hyperlipidemia, hypertension, osteoarthritis, CHF, hyperlipidemia and osteoarthritis who was recently admitted on August 3 with acute exacerbation of her CHF.  Patient came in today with progressive shortness of breath cough and hypoxia.  She was also showing some wheeze.   Clinical Impression   Stephanie Collins was seen for OT evaluation this date. Prior to hospital admission, pt was Independent for ADLs and household mobility using 2L [place] at baseline. Pt lives alone c daughter nearby who visits and provides meals 2x/day. Pt presents to acute OT demonstrating impaired ADL performance, functional cognition, and functional mobility 2/2 decreased safety awareness, functional strength/balance deficits, decreased activity tolerance, and poor command following. Pt currently requires SETUP self-drinking at bed level. Increased time sup<>sit c breaks during coughing fits. MIN A to thread depends over heels in sitting and MIN A + RW to don in standing. CGA for seated grooming tasks - assist for posterior lean c dynamic sitting. CGA + RW for ADL t/f. Pt would benefit from skilled OT to address noted impairments and functional limitations (see below for any additional details) in order to maximize safety and independence while minimizing falls risk and caregiver burden. Upon hospital discharge, recommend HHOT to maximize pt safety and return to functional independence during meaningful occupations of daily life.  At rest: SpO2 95% on 3L humidified [place] Seated EOB after coughing fit: desat 87% - resolved to 93% c increased time and PLB Standing: 78% on 3L humidified [place], resolved to 93% c seated rest and PLB End of session HoB 45*: 93% on 3L h Stephanie Collins     Follow Up Recommendations  Home health OT;Supervision/Assistance - 24 hour    Equipment Recommendations  3 in 1 bedside commode    Recommendations for Other Services       Precautions / Restrictions Precautions Precautions: Fall Precaution Comments: Watch O2 Restrictions Weight Bearing Restrictions: No      Mobility Bed Mobility Overal bed mobility: Modified Independent      General bed mobility comments: Increased time sup<>sit from near flat bed   Transfers Overall transfer level: Needs assistance Equipment used: Rolling walker (2 wheeled) Transfers: Sit to/from Stand Sit to Stand: Min guard      General transfer comment: CGA + RW sit<>stand decreasing to MIN A + 1HHA for sit<>stand during dressing     Balance Overall balance assessment: Needs assistance Sitting-balance support: No upper extremity supported;Feet supported Sitting balance-Leahy Scale: Fair   Postural control: Posterior lean Standing balance support: Single extremity supported;During functional activity Standing balance-Leahy Scale: Fair      ADL either performed or assessed with clinical judgement   ADL Overall ADL's : Needs assistance/impaired    General ADL Comments: MOD A don depends - assist to thread in sitting and CGA + RW to don in standing. SETUP self-drinking at bed level. CGA for seated grooming tasks - assist for posterior lean c dynamic sitting. CGA + RW for ADL t/f                   Pertinent Vitals/Pain Pain Assessment: No/denies pain     Hand Dominance Right   Extremity/Trunk Assessment Upper Extremity Assessment Upper Extremity Assessment: Generalized weakness   Lower Extremity Assessment Lower Extremity Assessment: Generalized weakness  Cervical / Trunk Assessment Cervical / Trunk Assessment: Kyphotic   Communication Communication Communication: HOH   Cognition Arousal/Alertness: Awake/alert Behavior During Therapy: Impulsive Overall Cognitive Status:  Impaired/Different from baseline Area of Impairment: Following commands;Problem solving;Safety/judgement      Following Commands: Follows one step commands consistently;Follows one step commands with increased time Safety/Judgement: Decreased awareness of safety   Problem Solving: Slow processing;Difficulty sequencing;Requires verbal cues General Comments: Follows one step commands with repetition. Pt concerned about urinating in bed -perseverative on subject   General Comments  Reclined at rest: SpO2 95% on 3L humidified Stephanie Collins. Seated EOB c coughing fit: desat 87% - resolved to 93% c increased time and PLB. Standing: 78% on 3L humidified Stephanie Collins, resolved to 93% c seated rest and PLB. At rest end of session HoB 45*: 93% on 3L h Stephanie Collins    Exercises Exercises: Other exercises Other Exercises Other Exercises: Pt and family educated re: OT role, DME recs, d/c recs, falls prevention, ECS, importance of supervision for safe mobility Other Exercises: LBD, toileting, self-drinking, sup<>sit, sit<>stand x3, sitting/standing balance/tolerance, ~5 ft in room mobility   Shoulder Instructions      Home Living Family/patient expects to be discharged to:: Private residence Living Arrangements: Alone Available Help at Discharge: Family;Available PRN/intermittently Type of Home: House Home Access: Stairs to enter [employer] of Steps: 2 STE         Bathroom Shower/Tub: Teacher, early years/pre: Handicapped height     Home Equipment: Environmental consultant - 2 wheels;Walker - 4 wheels;Cane - single point   Additional Comments: DTR reports granddaughters also available to help but pt has previously refused (does not want to impose)       Prior Functioning/Environment Level of Independence: Independent with assistive device(s)        Comments: MOD I household mobility using 2L [place], removes to sweep porch. RW or SPC use for community mobility. DTR cooks meals and is over 2x/day.         OT  Problem List: Decreased strength;Decreased activity tolerance;Impaired balance (sitting and/or standing);Decreased safety awareness;Cardiopulmonary status limiting activity      OT Treatment/Interventions: Self-care/ADL training;Therapeutic exercise;Energy conservation;DME and/or AE instruction;Therapeutic activities;Patient/family education;Balance training    OT Goals(Current goals can be found in the care plan section) Acute Rehab OT Goals Patient Stated Goal: To return home and breathe better OT Goal Formulation: With patient/family Time For Goal Achievement: 05/15/20 Potential to Achieve Goals: Good ADL Goals Pt Will Perform Grooming: sitting;with modified independence Pt Will Perform Lower Body Dressing: with supervision;sit to/from stand (c LRAD PRN) Pt Will Transfer to Toilet: with supervision;ambulating;bedside commode (c LRAD PRN) Additional ADL Goal #1: Pt will Independently verbalize plan to implement x3 ECS  OT Frequency: Min 1X/week   Barriers to D/C: Decreased caregiver support             AM-PAC OT "6 Clicks" Daily Activity     Outcome Measure Help from another person eating meals?: None Help from another person taking care of personal grooming?: A Little Help from another person toileting, which includes using toliet, bedpan, or urinal?: A Little Help from another person bathing (including washing, rinsing, drying)?: A Lot Help from another person to put on and taking off regular upper body clothing?: A Little Help from another person to put on and taking off regular lower body clothing?: A Lot 6 Click Score: 17   End of Session Equipment Utilized During Treatment: Rolling walker;Oxygen Nurse Communication: Mobility status  Activity Tolerance: Patient tolerated  treatment well Patient left: in bed;with call bell/phone within reach;with family/visitor present  OT Visit Diagnosis: Other abnormalities of gait and mobility (R26.89)                Time:  7035-0093 OT Time Calculation (min): 31 min Charges:  OT General Charges $OT Visit: 1 Visit OT Evaluation $OT Eval Moderate Complexity: 1 Mod OT Treatments $Self Care/Home Management : 23-37 mins  Dessie Coma, M.S. OTR/L  05/01/20, 2:53 PM  ascom [phone]

## 2020-05-01 NOTE — Progress Notes (Signed)
Mobility Specialist - Progress Note   05/01/20 1700  Mobility  Activity  (Chair Exercises )  Range of Motion/Exercises All extremities  Level of Assistance Independent after set-up  Distance Ambulated (ft) 0 ft  Mobility Response Tolerated well  Mobility performed by Mobility specialist  $Mobility charge 1 Mobility    Pre-mobility: 89 HR, 148/96 BP, 95% SpO2 Post-mobility: 89 HR, 143/79 BP, 92% SpO2   Pt was sitting in recliner utilizing 3L [place] O2. Pt agreed to session. Pt stated she had just worked with therapy. Pt washed her face, mobility assisted with set up. Pt performed exercises in recliner independently: ankle pumps, straight leg raises, and hip add/abd (10x/leg). Pt denied any pain, dizziness, fatigue, or nausea. Overall, pt tolerated session well. Pt was left in recliner with all needs in reach. Pt motivated to stay in recliner until after dinner.    Kathee Delton Mobility Specialist 05/01/20, 5:26 PM

## 2020-05-01 NOTE — Progress Notes (Signed)
[facility]    SUBJECTIVE: Shortness of breath much improved she had acute dyspnea initially but now feels much better feels well enough to go home no leg edema.   Vitals:   05/01/20 0415 05/01/20 0417 05/01/20 0840 05/01/20 0906  BP: 122/83  139/76   Pulse: 75  69   Resp: 16     Temp: 98 F (36.7 C)     TempSrc: Oral     SpO2: 95%   98%  Weight:  48.2 kg    Height:         Intake/Output Summary (Last 24 hours) at 05/01/2020 1037 Last data filed at 05/01/2020 0000 Gross per 24 hour  Intake 3 ml  Output 1500 ml  Net -1497 ml      PHYSICAL EXAM  General: Well developed, well nourished, in no acute distress HEENT:  Normocephalic and atramatic Neck:  No JVD.  Lungs: Diminished bilaterally to auscultation and percussion. Heart: HRRR . Normal S1 and S2 without gallops or murmurs.  Abdomen: Bowel sounds are positive, abdomen soft and non-tender  Msk:  Back normal, normal gait. Normal strength and tone for age. Extremities: No clubbing, cyanosis or edema.   Neuro: Alert and oriented X 3. Psych:  Good affect, responds appropriately   LABS: Basic Metabolic Panel: Recent Labs    04/30/20 0500 05/01/20 0629  NA 131* 131*  K 3.5 4.4  CL 92* 94*  CO2 28 27  GLUCOSE 95 94  BUN 23 27*  CREATININE 1.00 1.06*  CALCIUM 8.8* 8.9   Liver Function Tests: No results for input(s): AST, ALT, ALKPHOS, BILITOT, PROT, ALBUMIN in the last 72 hours. No results for input(s): LIPASE, AMYLASE in the last 72 hours. CBC: Recent Labs    04/30/20 0500 05/01/20 0629  WBC 9.6 10.7*  NEUTROABS 7.9*  --   HGB 12.4 13.1  HCT 35.2* 37.6  MCV 81.1 81.9  PLT 300 295   Cardiac Enzymes: No results for input(s): CKTOTAL, CKMB, CKMBINDEX, TROPONINI in the last 72 hours. BNP: Invalid input(s): POCBNP D-Dimer: No results for input(s): DDIMER in the last 72 hours. Hemoglobin A1C: No results for input(s): HGBA1C in the last 72 hours. Fasting Lipid Panel: No results for input(s): CHOL,  HDL, LDLCALC, TRIG, CHOLHDL, LDLDIRECT in the last 72 hours. Thyroid Function Tests: No results for input(s): TSH, T4TOTAL, T3FREE, THYROIDAB in the last 72 hours.  Invalid input(s): FREET3 Anemia Panel: No results for input(s): VITAMINB12, FOLATE, FERRITIN, TIBC, IRON, RETICCTPCT in the last 72 hours.  DG Chest 2 View  Result Date: 04/29/2020 CLINICAL DATA:  Shortness of breath. Cough. History of COPD and CHF. EXAM: CHEST - 2 VIEW COMPARISON:  03/22/2020 FINDINGS: Cardiac enlargement. Central pulmonary vascular congestion. Diffuse interstitial pattern to the lungs may represent a combination of fibrosis and edema. Mild bronchiectasis. Small bilateral pleural effusions. Bilateral basilar atelectasis. Appearances are similar to prior study. Calcification of the aorta. Diffuse degenerative change throughout the spine with multiple vertebral compression deformities, some post kyphoplasty. IMPRESSION: Cardiac enlargement with pulmonary vascular congestion, interstitial edema, and small bilateral pleural effusions. Bilateral basilar atelectasis. Electronically Signed   By: Lucienne Capers M.D.   On: 04/29/2020 19:11   ECHOCARDIOGRAM COMPLETE  Result Date: 04/30/2020    ECHOCARDIOGRAM REPORT   Patient Name:   Stephanie Collins Date of Exam: 04/30/2020 Medical Rec #:  [id number]   Height:       60.0 in Accession #:    [id number]  Weight:  101.6 lb Date of Birth:  1932-03-07   BSA:          1.400 m Patient Age:    86 years    BP:           134/70 mmHg Patient Gender: F           HR:           73 bpm. Exam Location:  [facility] Procedure: 2D Echo Indications:     CHF 428.31  History:         Patient has prior history of Echocardiogram examinations, most                  recent 03/22/2020.  Sonographer:     Arville Go RDCS Referring Phys:  [place] Diagnosing Phys: Yolonda Kida MD IMPRESSIONS  1. Left ventricular ejection fraction, by estimation, is 55 to 60%. The left ventricle has normal function.  Left ventricular endocardial border not optimally defined to evaluate regional wall motion. Left ventricular diastolic parameters are consistent with Grade II diastolic dysfunction (pseudonormalization). There is the interventricular septum is flattened in diastole ('D' shaped left ventricle), consistent with right ventricular volume overload.  2. Right ventricular systolic function is moderately reduced. The right ventricular size is moderately enlarged. There is mildly elevated pulmonary artery systolic pressure.  3. Left atrial size was mildly dilated.  4. Right atrial size was mildly dilated.  5. The mitral valve is abnormal. Mild mitral valve regurgitation.  6. The aortic valve is abnormal. Aortic valve regurgitation is not visualized. Mild aortic valve sclerosis is present, with no evidence of aortic valve stenosis. FINDINGS  Left Ventricle: Left ventricular ejection fraction, by estimation, is 55 to 60%. The left ventricle has normal function. Left ventricular endocardial border not optimally defined to evaluate regional wall motion. The left ventricular internal cavity size was normal in size. There is no left ventricular hypertrophy. The interventricular septum is flattened in diastole ('D' shaped left ventricle), consistent with right ventricular volume overload. Left ventricular diastolic parameters are consistent with Grade II diastolic dysfunction (pseudonormalization). Right Ventricle: The right ventricular size is moderately enlarged. No increase in right ventricular wall thickness. Right ventricular systolic function is moderately reduced. There is mildly elevated pulmonary artery systolic pressure. Left Atrium: Left atrial size was mildly dilated. Right Atrium: Right atrial size was mildly dilated. Pericardium: There is no evidence of pericardial effusion. Mitral Valve: The mitral valve is abnormal. There is mild calcification of the mitral valve leaflet(s). Mild mitral annular calcification. Mild  mitral valve regurgitation. Tricuspid Valve: The tricuspid valve is grossly normal. Tricuspid valve regurgitation is trivial. Aortic Valve: The aortic valve is abnormal. Aortic valve regurgitation is not visualized. Mild aortic valve sclerosis is present, with no evidence of aortic valve stenosis. Aortic valve peak gradient measures 4.3 mmHg. Pulmonic Valve: The pulmonic valve was normal in structure. Pulmonic valve regurgitation is not visualized. Aorta: Aortic root could not be assessed. IAS/Shunts: No atrial level shunt detected by color flow Doppler.  LEFT VENTRICLE PLAX 2D LVIDd:         4.47 cm  Diastology LVIDs:         3.14 cm  LV e' medial:    4.57 cm/s LV PW:         1.13 cm  LV E/e' medial:  10.9 LV IVS:        0.96 cm  LV e' lateral:   6.09 cm/s LVOT diam:     2.00  cm  LV E/e' lateral: 8.2 LV SV:         36 LV SV Index:   26 LVOT Area:     3.14 cm  RIGHT VENTRICLE RV Basal diam:  4.14 cm RV S prime:     8.59 cm/s TAPSE (M-mode): 1.7 cm LEFT ATRIUM             Index       RIGHT ATRIUM           Index LA diam:        3.80 cm 2.71 cm/m  RA Area:     23.40 cm LA Vol (A2C):   31.7 ml 22.65 ml/m RA Volume:   79.60 ml  56.87 ml/m LA Vol (A4C):   22.8 ml 16.29 ml/m LA Biplane Vol: 26.9 ml 19.22 ml/m  AORTIC VALVE AV Area (Vmax): 1.72 cm AV Vmax:        104.00 cm/s AV Peak Grad:   4.3 mmHg LVOT Vmax:      56.80 cm/s LVOT Vmean:     33.500 cm/s LVOT VTI:       0.115 m  AORTA Ao Root diam: 3.00 cm Ao Asc diam:  2.90 cm MITRAL VALVE                TRICUSPID VALVE MV Area (PHT): 2.48 cm     TV Peak grad:   38.9 mmHg MV Decel Time: 306 msec     TV Vmax:        3.12 m/s MV E velocity: 49.70 cm/s MV A velocity: 100.00 cm/s  SHUNTS MV E/A ratio:  0.50         Systemic VTI:  0.12 m                             Systemic Diam: 2.00 cm Analucia Hush D Excell Neyland MD Electronically signed by Yolonda Kida MD Signature Date/Time: 04/30/2020/2:58:54 PM    Final      Echo preserved left ventricular function ejection  fraction around 55 to 60%  TELEMETRY: Normal sinus rhythm nonspecific ST-T wave changes  ASSESSMENT AND PLAN:  Principal Problem:   Acute exacerbation of CHF (congestive heart failure) (HCC) Active Problems:   Chronic obstructive pulmonary disease (HCC)   Bergmann's syndrome   OP (osteoporosis)   Acute on chronic respiratory failure with hypoxia (HCC)   Hyponatremia   CKD (chronic kidney disease), stage III   CHF (congestive heart failure) ([place])    Plan continue current monitoring continue rule out microinfarction EKGs EKGs troponins unremarkable continue supplemental oxygen and inhalers as necessary maintain diuretic therapy as necessary patient stable with improved congestive heart failure agree with echocardiogram for further evaluation and congestive heart failure at the patient improved will have him follow-up with Dr. Ubaldo Glassing as an outpatient 1 to 2 weeks continue diuretic therapy may need Lasix daily   Yolonda Kida, MD 05/01/2020 10:37 AM

## 2020-05-01 NOTE — Evaluation (Signed)
Physical Therapy Evaluation Patient Details Name: Stephanie Collins MRN: [id number] DOB: 08-10-1932 Today's Date: 05/01/2020   History of Present Illness  Stephanie Collins is a 86 y.o. female with medical history of COPD, lung cancer status post right lower lobectomy, hyperlipidemia, hypertension, osteoarthritis, CHF, hyperlipidemia, and osteoarthritis. She was recently admitted on August 3 with acute exacerbation of her CHF.  She presents with progressive shortness of breath, cough, hypoxia, and some wheezing.  Clinical Impression  Patient presents in fowler's position in bed with her daughter in the room. She denies any pain and is A&Ox4. Prior to this hospital stay, she was independent with all ADLs and household mobility with 2L of O2 at baseline. She would only use a walker or AD when she went out in the community. She reports only 1 fall in the last 6 months which was due to pulling 1 blade of grass. Patient is generally weak bilaterally in her upper and lower extremity. She is modified independent with all bed mobility and just takes extra time. Upon sitting EOB, patient has posterior lean with no UE support but able to maintain balance. When standing up, patient pushes off with BUE from bed with CGA +1 assist, and is steady when standing up with RUE on bed rail. Patient able to balance with BUE support independently. Patient walked 6 feet in the room before sitting down in recliner chair with CGA to min A +1. Verbal cues utilized for proper technique and positioning. Her O2 saturation levels were closely monitored throughout entire session which ranged from 88-96 while on 3L of O2. Patient took frequent rest breaks and cued to take deep breaths. She will benefit from skilled therapy during her stay and continued PT at home to increase strength and balance to return to PLOF with minimal difficulty.    Follow Up Recommendations Home health PT    Equipment Recommendations  None recommended by PT     Recommendations for Other Services       Precautions / Restrictions Precautions Precautions: Fall Precaution Comments: Watch O2 Restrictions Weight Bearing Restrictions: No      Mobility  Bed Mobility Overal bed mobility: Modified Independent             General bed mobility comments: Increased time sup<>sit from near flat bed   Transfers Overall transfer level: Needs assistance Equipment used: Rolling walker (2 wheeled) Transfers: Sit to/from Stand Sit to Stand: Min guard         General transfer comment: CGA with RW during sit<>stand, verbal cues needed for proper technique  Ambulation/Gait Ambulation/Gait assistance: Min guard Gait Distance (Feet): 6 Feet Assistive device: Rolling walker (2 wheeled) Gait Pattern/deviations: Step-through pattern;Decreased step length - left;Decreased step length - right     General Gait Details: CGA with RW, VCs utilized for proper technique with RW  Stairs            Wheelchair Mobility    Modified Rankin (Stroke Patients Only)       Balance Overall balance assessment: Needs assistance Sitting-balance support: No upper extremity supported;Feet supported Sitting balance-Leahy Scale: Fair Sitting balance - Comments: Posterior lean with sitting unsupported, but able to maintain balance Postural control: Posterior lean Standing balance support: Single extremity supported;Bilateral upper extremity supported Standing balance-Leahy Scale: Fair Standing balance comment: With SUE support, pt had posterior lean but able to maintain balance, with BUE support she had good balance with no posterior lean.  Pertinent Vitals/Pain Pain Assessment: No/denies pain    Home Living Family/patient expects to be discharged to:: Private residence Living Arrangements: Alone Available Help at Discharge: Family;Available PRN/intermittently Type of Home: House Home Access: Stairs to  enter Entrance Stairs-Rails: Can reach both Entrance Stairs-Number of Steps: 4 stairs in the front, 2 stairs in the back Home Layout: One level Home Equipment: Walker - 2 wheels;Walker - 4 wheels;Cane - single point Additional Comments: DTR reports granddaughters also available to help but pt has previously refused (does not want to impose)     Prior Function Level of Independence: Independent with assistive device(s)         Comments: MOD I household mobility using 2L [place], removes to sweep porch. RW or SPC use for community mobility. DTR cooks meals and is over 2x/day.      Hand Dominance   Dominant Hand: Right    Extremity/Trunk Assessment   Upper Extremity Assessment Upper Extremity Assessment: Generalized weakness (Generally 3+/5 BUE strength)    Lower Extremity Assessment Lower Extremity Assessment: Generalized weakness (Generally 3+/5 BLE strength)    Cervical / Trunk Assessment Cervical / Trunk Assessment: Kyphotic  Communication   Communication: HOH  Cognition Arousal/Alertness: Awake/alert Behavior During Therapy: WFL for tasks assessed/performed Overall Cognitive Status: Within Functional Limits for tasks assessed Area of Impairment: Following commands;Problem solving;Safety/judgement                       Following Commands: Follows one step commands consistently;Follows one step commands with increased time Safety/Judgement: Decreased awareness of safety   Problem Solving: Slow processing;Difficulty sequencing;Requires verbal cues General Comments: Follows one step commands with repetition. Pt concerned about urinating in bed -perseverative on subject      General Comments General comments (skin integrity, edema, etc.): Monitored O2 levels throughout treatment, levels between 88-96 on 3L humidified [place]    Exercises Other Exercises Other Exercises: Standing balance with UE support x 2 minutes, standing marches alternating LE x 10 each LE Other  Exercises: LBD, toileting, self-drinking, sup<>sit, sit<>stand x3, sitting/standing balance/tolerance, ~5 ft in room mobility   Assessment/Plan    PT Assessment Patient needs continued PT services  PT Problem List Decreased strength;Decreased balance;Decreased mobility       PT Treatment Interventions Stair training;Gait training;Therapeutic activities;Therapeutic exercise;Balance training;Neuromuscular re-education    PT Goals (Current goals can be found in the Care Plan section)  Acute Rehab PT Goals Patient Stated Goal: To return home and breathe better PT Goal Formulation: With patient Time For Goal Achievement: 05/15/20 Potential to Achieve Goals: Good    Frequency Min 2X/week   Barriers to discharge        Co-evaluation               AM-PAC PT "6 Clicks" Mobility  Outcome Measure Help needed turning from your back to your side while in a flat bed without using bedrails?: A Little Help needed moving from lying on your back to sitting on the side of a flat bed without using bedrails?: A Little Help needed moving to and from a bed to a chair (including a wheelchair)?: A Little Help needed standing up from a chair using your arms (e.g., wheelchair or bedside chair)?: A Little Help needed to walk in hospital room?: A Little Help needed climbing 3-5 steps with a railing? : A Lot 6 Click Score: 17    End of Session Equipment Utilized During Treatment: Gait belt;Oxygen Activity Tolerance: Patient tolerated treatment well Patient left:  in chair;with call bell/phone within reach;with chair alarm set;with family/visitor present Nurse Communication: Mobility status PT Visit Diagnosis: Muscle weakness (generalized) (M62.81);History of falling (Z91.81);Difficulty in walking, not elsewhere classified (R26.2)    Time: 2300-9794 PT Time Calculation (min) (ACUTE ONLY): 29 min   Charges:               Noemi Chapel, SPT Bernita Raisin 05/01/2020, 3:28 PM

## 2020-05-01 NOTE — Progress Notes (Signed)
PROGRESS NOTE    Stephanie Collins  EHU:[id number] DOB: May 10, 1932 DOA: 04/29/2020 PCP: Baxter Hire, MD   Assessment & Plan:   Principal Problem:   Acute exacerbation of CHF (congestive heart failure) ([place]) Active Problems:   Chronic obstructive pulmonary disease ([place])   Bergmann's syndrome   OP (osteoporosis)   Acute on chronic respiratory failure with hypoxia ([facility])   Hyponatremia   CKD (chronic kidney disease), stage III   CHF (congestive heart failure) ([place])   Acute on chronic diastolic CHF exacerbation: continue on IV lasix. Monitor I/Os. Continue on tele. Echo shows EF 55-60%, grade II diastolic function, slightly worse from month prior. Cardio following and recs apprec   Acute on chronic hypoxic respiratory failure: likely secondary to CHF & COPD. Continue on supplemental oxygen and wean back to baseline as tolerated  COPD: w/o exacerbation. Continue on bronchodilators. Encourage incentive spirometry   Leukocytosis: likely reactive. Will continue to monitor   HTN: uncontrolled. Continue on home dose of carvedilol, lisinopril   CKDIIIa: stable. Avoid nephrotoxic meds  OA: not on DMARDs or NSAIDs as per home med rec. Continue w/ supportive care   Hyponatremia: etiology unclear. Stable from day prior  Hx of lung cancer: management per outpatient oncology    DVT prophylaxis: lovenox Code Status: full  Family Communication: discussed pt's care w/ pt's family at bedside and answered their questions  Disposition Plan: depends on PT/OT  Status is: Inpatient  Remains inpatient appropriate because:IV treatments appropriate due to intensity of illness or inability to take PO   Dispo: The patient is from: Home              Anticipated d/c is to: Home vs home health               Anticipated d/c date is: 2 days              Patient currently is not medically stable to d/c.         Consultants:      Procedures:    Antimicrobials:    Subjective: Pt  c/o cough.   Objective: Vitals:   04/30/20 2034 04/30/20 2359 05/01/20 0415 05/01/20 0417  BP: 126/71 132/73 122/83   Pulse: 68 62 75   Resp: 16 16 16    Temp: 98 F (36.7 C) 97.6 F (36.4 C) 98 F (36.7 C)   TempSrc: Oral Oral Oral   SpO2: 95% 98% 95%   Weight:    48.2 kg  Height:        Intake/Output Summary (Last 24 hours) at 05/01/2020 0747 Last data filed at 05/01/2020 0000 Gross per 24 hour  Intake 3 ml  Output 1500 ml  Net -1497 ml   Filed Weights   04/29/20 1840 04/30/20 0734 05/01/20 0417  Weight: 46.3 kg 46.1 kg 48.2 kg    Examination:  General exam: Appears calm and comfortable  Respiratory system: decreased breath sounds b/l. Rales scattered  Cardiovascular system: S1 & S2+. No  rubs, gallops or clicks. No pedal edema. Gastrointestinal system: Abdomen is nondistended, soft and nontender. Hypoactive bowel sounds  Central nervous system: Alert and oriented. Moves all 4 extremities  Psychiatry: Judgement and insight appear normal. Mood & affect appropriate    Data Reviewed: I have personally reviewed following labs and imaging studies  CBC: Recent Labs  Lab 04/29/20 1842 04/30/20 0500 05/01/20 0629  WBC 10.6* 9.6 10.7*  NEUTROABS  --  7.9*  --   HGB 11.5* 12.4 13.1  HCT 33.4* 35.2* 37.6  MCV 79.3* 81.1 81.9  PLT 292 300 814   Basic Metabolic Panel: Recent Labs  Lab 04/29/20 1842 04/30/20 0500 05/01/20 0629  NA 125* 131* 131*  K 4.2 3.5 4.4  CL 91* 92* 94*  CO2 25 28 27   GLUCOSE 124* 95 94  BUN 24* 23 27*  CREATININE 1.19* 1.00 1.06*  CALCIUM 8.9 8.8* 8.9   GFR: Estimated Creatinine Clearance: 27.4 mL/min (A) (by C-G formula based on SCr of 1.06 mg/dL (H)). Liver Function Tests: No results for input(s): AST, ALT, ALKPHOS, BILITOT, PROT, ALBUMIN in the last 168 hours. No results for input(s): LIPASE, AMYLASE in the last 168 hours. No results for input(s): AMMONIA in the last 168 hours. Coagulation Profile: No results for input(s): INR,  PROTIME in the last 168 hours. Cardiac Enzymes: No results for input(s): CKTOTAL, CKMB, CKMBINDEX, TROPONINI in the last 168 hours. BNP (last 3 results) No results for input(s): PROBNP in the last 8760 hours. HbA1C: No results for input(s): HGBA1C in the last 72 hours. CBG: No results for input(s): GLUCAP in the last 168 hours. Lipid Profile: No results for input(s): CHOL, HDL, LDLCALC, TRIG, CHOLHDL, LDLDIRECT in the last 72 hours. Thyroid Function Tests: No results for input(s): TSH, T4TOTAL, FREET4, T3FREE, THYROIDAB in the last 72 hours. Anemia Panel: No results for input(s): VITAMINB12, FOLATE, FERRITIN, TIBC, IRON, RETICCTPCT in the last 72 hours. Sepsis Labs: No results for input(s): PROCALCITON, LATICACIDVEN in the last 168 hours.  Recent Results (from the past 240 hour(s))  SARS Coronavirus 2 by RT PCR (hospital order, performed in [facility] hospital lab) Nasopharyngeal Nasopharyngeal Swab     Status: None   Collection Time: 04/29/20  8:47 PM   Specimen: Nasopharyngeal Swab  Result Value Ref Range Status   SARS Coronavirus 2 NEGATIVE NEGATIVE Final    Comment: (NOTE) SARS-CoV-2 target nucleic acids are NOT DETECTED.  The SARS-CoV-2 RNA is generally detectable in upper and lower respiratory specimens during the acute phase of infection. The lowest concentration of SARS-CoV-2 viral copies this assay can detect is 250 copies / mL. A negative result does not preclude SARS-CoV-2 infection and should not be used as the sole basis for treatment or other patient management decisions.  A negative result may occur with improper specimen collection / handling, submission of specimen other than nasopharyngeal swab, presence of viral mutation(s) within the areas targeted by this assay, and inadequate number of viral copies (<250 copies / mL). A negative result must be combined with clinical observations, patient history, and epidemiological information.  Fact Sheet for Patients:    StrictlyIdeas.no  Fact Sheet for Healthcare Providers: BankingDealers.co.za  This test is not yet approved or  cleared by the [place] FDA and has been authorized for detection and/or diagnosis of SARS-CoV-2 by FDA under an Emergency Use Authorization (EUA).  This EUA will remain in effect (meaning this test can be used) for the duration of the COVID-19 declaration under Section 564(b)(1) of the Act, 21 U.S.C. section 360bbb-3(b)(1), unless the authorization is terminated or revoked sooner.  Performed at [facility], [address], [place], [place] [address]          Radiology Studies: DG Chest 2 View  Result Date: 04/29/2020 CLINICAL DATA:  Shortness of breath. Cough. History of COPD and CHF. EXAM: CHEST - 2 VIEW COMPARISON:  03/22/2020 FINDINGS: Cardiac enlargement. Central pulmonary vascular congestion. Diffuse interstitial pattern to the lungs may represent a combination of fibrosis and edema.  Mild bronchiectasis. Small bilateral pleural effusions. Bilateral basilar atelectasis. Appearances are similar to prior study. Calcification of the aorta. Diffuse degenerative change throughout the spine with multiple vertebral compression deformities, some post kyphoplasty. IMPRESSION: Cardiac enlargement with pulmonary vascular congestion, interstitial edema, and small bilateral pleural effusions. Bilateral basilar atelectasis. Electronically Signed   By: Lucienne Capers M.D.   On: 04/29/2020 19:11   ECHOCARDIOGRAM COMPLETE  Result Date: 04/30/2020    ECHOCARDIOGRAM REPORT   Patient Name:   Stephanie Collins Date of Exam: 04/30/2020 Medical Rec #:  [id number]   Height:       60.0 in Accession #:    [id number]  Weight:       101.6 lb Date of Birth:  1932-01-02   BSA:          1.400 m Patient Age:    59 years    BP:           134/70 mmHg Patient Gender: F           HR:           73 bpm. Exam Location:  [facility] Procedure: 2D Echo  Indications:     CHF 428.31  History:         Patient has prior history of Echocardiogram examinations, most                  recent 03/22/2020.  Sonographer:     Arville Go RDCS Referring Phys:  [place] Diagnosing Phys: Yolonda Kida MD IMPRESSIONS  1. Left ventricular ejection fraction, by estimation, is 55 to 60%. The left ventricle has normal function. Left ventricular endocardial border not optimally defined to evaluate regional wall motion. Left ventricular diastolic parameters are consistent with Grade II diastolic dysfunction (pseudonormalization). There is the interventricular septum is flattened in diastole ('D' shaped left ventricle), consistent with right ventricular volume overload.  2. Right ventricular systolic function is moderately reduced. The right ventricular size is moderately enlarged. There is mildly elevated pulmonary artery systolic pressure.  3. Left atrial size was mildly dilated.  4. Right atrial size was mildly dilated.  5. The mitral valve is abnormal. Mild mitral valve regurgitation.  6. The aortic valve is abnormal. Aortic valve regurgitation is not visualized. Mild aortic valve sclerosis is present, with no evidence of aortic valve stenosis. FINDINGS  Left Ventricle: Left ventricular ejection fraction, by estimation, is 55 to 60%. The left ventricle has normal function. Left ventricular endocardial border not optimally defined to evaluate regional wall motion. The left ventricular internal cavity size was normal in size. There is no left ventricular hypertrophy. The interventricular septum is flattened in diastole ('D' shaped left ventricle), consistent with right ventricular volume overload. Left ventricular diastolic parameters are consistent with Grade II diastolic dysfunction (pseudonormalization). Right Ventricle: The right ventricular size is moderately enlarged. No increase in right ventricular wall thickness. Right ventricular systolic function is  moderately reduced. There is mildly elevated pulmonary artery systolic pressure. Left Atrium: Left atrial size was mildly dilated. Right Atrium: Right atrial size was mildly dilated. Pericardium: There is no evidence of pericardial effusion. Mitral Valve: The mitral valve is abnormal. There is mild calcification of the mitral valve leaflet(s). Mild mitral annular calcification. Mild mitral valve regurgitation. Tricuspid Valve: The tricuspid valve is grossly normal. Tricuspid valve regurgitation is trivial. Aortic Valve: The aortic valve is abnormal. Aortic valve regurgitation is not visualized. Mild aortic valve sclerosis is present, with no evidence of aortic valve stenosis. Aortic  valve peak gradient measures 4.3 mmHg. Pulmonic Valve: The pulmonic valve was normal in structure. Pulmonic valve regurgitation is not visualized. Aorta: Aortic root could not be assessed. IAS/Shunts: No atrial level shunt detected by color flow Doppler.  LEFT VENTRICLE PLAX 2D LVIDd:         4.47 cm  Diastology LVIDs:         3.14 cm  LV e' medial:    4.57 cm/s LV PW:         1.13 cm  LV E/e' medial:  10.9 LV IVS:        0.96 cm  LV e' lateral:   6.09 cm/s LVOT diam:     2.00 cm  LV E/e' lateral: 8.2 LV SV:         36 LV SV Index:   26 LVOT Area:     3.14 cm  RIGHT VENTRICLE RV Basal diam:  4.14 cm RV S prime:     8.59 cm/s TAPSE (M-mode): 1.7 cm LEFT ATRIUM             Index       RIGHT ATRIUM           Index LA diam:        3.80 cm 2.71 cm/m  RA Area:     23.40 cm LA Vol (A2C):   31.7 ml 22.65 ml/m RA Volume:   79.60 ml  56.87 ml/m LA Vol (A4C):   22.8 ml 16.29 ml/m LA Biplane Vol: 26.9 ml 19.22 ml/m  AORTIC VALVE AV Area (Vmax): 1.72 cm AV Vmax:        104.00 cm/s AV Peak Grad:   4.3 mmHg LVOT Vmax:      56.80 cm/s LVOT Vmean:     33.500 cm/s LVOT VTI:       0.115 m  AORTA Ao Root diam: 3.00 cm Ao Asc diam:  2.90 cm MITRAL VALVE                TRICUSPID VALVE MV Area (PHT): 2.48 cm     TV Peak grad:   38.9 mmHg MV Decel  Time: 306 msec     TV Vmax:        3.12 m/s MV E velocity: 49.70 cm/s MV A velocity: 100.00 cm/s  SHUNTS MV E/A ratio:  0.50         Systemic VTI:  0.12 m                             Systemic Diam: 2.00 cm Dwayne D Callwood MD Electronically signed by Yolonda Kida MD Signature Date/Time: 04/30/2020/2:58:54 PM    Final         Scheduled Meds: . carvedilol  3.125 mg Oral BID WC  . enoxaparin (LOVENOX) injection  30 mg Subcutaneous Q24H  . furosemide  20 mg Intravenous BID  . ipratropium-albuterol  3 mL Nebulization QID  . lisinopril  2.5 mg Oral Daily  . sodium chloride flush  3 mL Intravenous Q12H   Continuous Infusions: . sodium chloride       LOS: 1 day    Time spent: 32 mins     Wyvonnia Dusky, MD [facility] Pager [phone]  If 7PM-7AM, please contact night-coverage [email or web address] 05/01/2020, 7:47 AM

## 2020-05-02 DIAGNOSIS — E43 Unspecified severe protein-calorie malnutrition: Secondary | ICD-10-CM | POA: Insufficient documentation

## 2020-05-02 LAB — BASIC METABOLIC PANEL
Anion gap: 11 (ref 5–15)
BUN: 31 mg/dL — ABNORMAL HIGH (ref 8–23)
CO2: 27 mmol/L (ref 22–32)
Calcium: 9 mg/dL (ref 8.9–10.3)
Chloride: 89 mmol/L — ABNORMAL LOW (ref 98–111)
Creatinine, Ser: 1.25 mg/dL — ABNORMAL HIGH (ref 0.44–1.00)
GFR calc Af Amer: 45 mL/min — ABNORMAL LOW (ref >60)
GFR calc non Af Amer: 39 mL/min — ABNORMAL LOW (ref >60)
Glucose, Bld: 106 mg/dL — ABNORMAL HIGH (ref 70–99)
Potassium: 4.3 mmol/L (ref 3.5–5.1)
Sodium: 127 mmol/L — ABNORMAL LOW (ref 135–145)

## 2020-05-02 LAB — CBC
HCT: 38.5 % (ref 36.0–46.0)
Hemoglobin: 13.1 g/dL (ref 12.0–15.0)
MCH: 27.2 pg (ref 26.0–34.0)
MCHC: 34 g/dL (ref 30.0–36.0)
MCV: 79.9 fL — ABNORMAL LOW (ref 80.0–100.0)
Platelets: 270 10*3/uL (ref 150–400)
RBC: 4.82 MIL/uL (ref 3.87–5.11)
RDW: 15.6 % — ABNORMAL HIGH (ref 11.5–15.5)
WBC: 18.2 10*3/uL — ABNORMAL HIGH (ref 4.0–10.5)
nRBC: 0 % (ref 0.0–0.2)

## 2020-05-02 MED ORDER — POTASSIUM CHLORIDE ER 10 MEQ PO TBCR: 10.0000 meq | EXTENDED_RELEASE_TABLET | Freq: Every day | ORAL | 0 refills | Status: DC

## 2020-05-02 MED ORDER — SODIUM CHLORIDE 1 G PO TABS: 1.0000 g | ORAL_TABLET | Freq: Two times a day (BID) | ORAL | 0 refills | Status: AC

## 2020-05-02 MED ORDER — FUROSEMIDE 40 MG PO TABS: 40.0000 mg | ORAL_TABLET | Freq: Every day | ORAL | 0 refills | Status: DC

## 2020-05-02 MED ORDER — BENZONATATE 200 MG PO CAPS: 200.0000 mg | ORAL_CAPSULE | Freq: Three times a day (TID) | ORAL | 0 refills | Status: AC | PRN

## 2020-05-02 MED ORDER — MAGNESIUM HYDROXIDE 400 MG/5ML PO SUSP: 15.0000 mL | Freq: Every day | ORAL | Status: DC

## 2020-05-02 NOTE — Discharge Summary (Addendum)
Physician Discharge Summary  Stephanie Collins WUJ:[id number] DOB: May 25, 1932 DOA: 04/29/2020  PCP: Baxter Hire, MD  Admit date: 04/29/2020 Discharge date: 05/02/2020  Admitted From: home Disposition:  home  Recommendations for Outpatient Follow-up:  1. Follow up with PCP in 1-2 weeks 2. F/u cardio in 1 week  Home Health: yes Equipment/Devices:  Discharge Condition: stable  CODE STATUS: full  Diet recommendation: General   Brief/Interim Summary: HPI was taken from Dr. Jonelle Sidle: Stephanie Collins is a 86 y.o. female with medical history significant of COPD, lung cancer status post right lower lobectomy, hyperlipidemia, hypertension, osteoarthritis, CHF, hyperlipidemia and osteoarthritis who was recently admitted on August 3 with acute exacerbation of her CHF.  At that time she had an echo showed EF 70 to 75% consistent with findings of diastolic dysfunction.  Patient was evaluated and placed on Lasix and other cardiac medications.  She also follows up with cardiology.  Patient came in today with progressive shortness of breath cough and hypoxia.  She was also showing some wheeze.  No chest pain.  No hemoptysis,.  Patient was given Lasix in the ER she has diuresed some.  It does not appear to be consistent with her previous COPD exacerbation.  Chest x-ray showed pulmonary vascular congestion with interstitial edema and small bilateral pleural effusions.  Findings therefore favored acute exacerbation of her diastolic dysfunction CHF she has been admitted for further treatment..  ED Course: Sodium 125 potassium 4.2 chloride 91 CO2 25 glucose 124.  BUN is 24 creatinine 1.19.  BNP is 2180.  Troponin XIX and then 18.  White count 10.6 and platelets 292.  COVID-19 is negative.  Chest x-ray showed cardiac enlargement with pulmonary vascular congestion, interstitial edema and small bilateral pleural effusions.  Patient being admitted with acute exacerbation of CHF  Hospital course from Dr. Lenna Sciara. Stephanie Collins  9/11-9/13/21: Pt presented w/ shortness of breath and was found to have acute diastolic CHF exacerbation. Pt was treated w/ IV lasix. Pt responded relatively well to above and below stated treatment. Pt was sent home w/ lasix 40mg  daily until pt is able to see Dr. Ubaldo Glassing (cardio) outpatient. Pt and pt's daughter at bedside verbalized their understanding. Of note, PT/OT saw the pt and recommended home health. Home health was set up by CM prior to d/c.   Discharge Diagnoses:  Principal Problem:   Acute exacerbation of CHF (congestive heart failure) (HCC) Active Problems:   Chronic obstructive pulmonary disease (HCC)   Bergmann's syndrome   OP (osteoporosis)   Acute on chronic respiratory failure with hypoxia (HCC)   Hyponatremia   CKD (chronic kidney disease), stage III   CHF (congestive heart failure) (HCC)   Protein-calorie malnutrition, severe  Acute on chronic diastolic CHF exacerbation:continue on IV lasix. Monitor I/Os. Continue on tele. Echo shows EF 55-60%, grade II diastolic function, slightly worse from month prior. Cardio following and recs apprec   Acute on chronic hypoxic respiratory failure: likely secondary to CHF & COPD. Continue on supplemental oxygen and wean back to baseline as tolerated  COPD:w/o exacerbation. Continue on bronchodilators. Encourage incentive spirometry   Leukocytosis: likely reactive. Will continue to monitor   HTN: uncontrolled. Continue on home dose of carvedilol, lisinopril   CKDIIIa:Cr is trending up slightly today, likely secondary to lasix use. Will continue to monitor   OA:not on DMARDs or NSAIDs as per home med rec. Continue w/ supportive care   Hyponatremia: etiology unclear, trending down. Will start NaCls for a couple of days which was  discussed w/ pt's daughter via phone   Hx of lung cancer:management per outpatient oncology   Discharge Instructions  Discharge Instructions    Diet - low sodium heart healthy   Complete by:  As directed    Discharge instructions   Complete by: As directed    F/u cardio, Dr. Ubaldo Glassing, in 1-2 weeks. F/u pulmon in 1 week. F/u PCP in 2 weeks   Increase activity slowly   Complete by: As directed      Allergies as of 05/02/2020      Reactions   Tramadol    confusion      Medication List    TAKE these medications   acetaminophen 325 MG tablet Commonly known as: TYLENOL Take 650 mg by mouth as needed for mild pain.   albuterol 108 (90 Base) MCG/ACT inhaler Commonly known as: VENTOLIN HFA Inhale 2 puffs into the lungs every 4 (four) hours as needed for wheezing or shortness of breath.   benzonatate 200 MG capsule Commonly known as: TESSALON Take 1 capsule (200 mg total) by mouth 3 (three) times daily as needed for up to 7 days for cough. Notes to patient: Last dose give 05/02/20 at 9am   calcium carbonate 750 MG chewable tablet Commonly known as: TUMS EX Chew 1 tablet by mouth as needed for heartburn.   carvedilol 3.125 MG tablet Commonly known as: COREG Take 1 tablet (3.125 mg total) by mouth 2 (two) times daily with a meal.   cholecalciferol 25 MCG (1000 UNIT) tablet Commonly known as: VITAMIN D3 Take 2,000 Units by mouth daily.   diphenhydrAMINE 25 mg capsule Commonly known as: BENADRYL Take 25 mg by mouth at bedtime as needed for itching.   Fluticasone-Salmeterol 250-50 MCG/DOSE Aepb Commonly known as: ADVAIR Inhale 1 puff into the lungs 2 (two) times daily.   Wixela Inhub 250-50 MCG/DOSE Aepb Generic drug: Fluticasone-Salmeterol Inhale 1 puff into the lungs every 12 (twelve) hours.   furosemide 40 MG tablet Commonly known as: LASIX Take 1 tablet (40 mg total) by mouth daily. What changed:   medication strength  how much to take  when to take this  reasons to take this   lisinopril 2.5 MG tablet Commonly known as: ZESTRIL Take 1 tablet (2.5 mg total) by mouth daily.   potassium chloride 10 MEQ tablet Commonly known as: KLOR-CON Take 1 tablet  (10 mEq total) by mouth daily. While taking lasix            Durable Medical Equipment  (From admission, onward)         Start     Ordered   05/02/20 0759  For home use only DME 3 n 1  Once        05/02/20 0758          Follow-up Information    [place]. Go on 05/10/2020.   Specialty: Cardiology Why: at [place] through [facility] Contact information: [place] [address] [place] [phone]             Allergies  Allergen Reactions   Tramadol     confusion    Consultations:  Cardio    Procedures/Studies: DG Chest 2 View  Result Date: 04/29/2020 CLINICAL DATA:  Shortness of breath. Cough. History of COPD and CHF. EXAM: CHEST - 2 VIEW COMPARISON:  03/22/2020 FINDINGS: Cardiac enlargement. Central pulmonary vascular congestion. Diffuse interstitial pattern to the lungs may represent a  combination of fibrosis and edema. Mild bronchiectasis. Small bilateral pleural effusions. Bilateral basilar atelectasis. Appearances are similar to prior study. Calcification of the aorta. Diffuse degenerative change throughout the spine with multiple vertebral compression deformities, some post kyphoplasty. IMPRESSION: Cardiac enlargement with pulmonary vascular congestion, interstitial edema, and small bilateral pleural effusions. Bilateral basilar atelectasis. Electronically Signed   By: Lucienne Capers M.D.   On: 04/29/2020 19:11   ECHOCARDIOGRAM COMPLETE  Result Date: 04/30/2020    ECHOCARDIOGRAM REPORT   Patient Name:   SHAYLA HEMING Date of Exam: 04/30/2020 Medical Rec #:  [id number]   Height:       60.0 in Accession #:    [id number]  Weight:       101.6 lb Date of Birth:  [birth year]   BSA:          1.400 m Patient Age:    57 years    BP:           134/70 mmHg Patient Gender: F           HR:           73 bpm. Exam Location:  [facility] Procedure: 2D Echo Indications:     CHF 428.31   History:         Patient has prior history of Echocardiogram examinations, most                  recent 03/22/2020.  Sonographer:     Arville Go RDCS Referring Phys:  [place] Diagnosing Phys: Yolonda Kida MD IMPRESSIONS  1. Left ventricular ejection fraction, by estimation, is 55 to 60%. The left ventricle has normal function. Left ventricular endocardial border not optimally defined to evaluate regional wall motion. Left ventricular diastolic parameters are consistent with Grade II diastolic dysfunction (pseudonormalization). There is the interventricular septum is flattened in diastole ('D' shaped left ventricle), consistent with right ventricular volume overload.  2. Right ventricular systolic function is moderately reduced. The right ventricular size is moderately enlarged. There is mildly elevated pulmonary artery systolic pressure.  3. Left atrial size was mildly dilated.  4. Right atrial size was mildly dilated.  5. The mitral valve is abnormal. Mild mitral valve regurgitation.  6. The aortic valve is abnormal. Aortic valve regurgitation is not visualized. Mild aortic valve sclerosis is present, with no evidence of aortic valve stenosis. FINDINGS  Left Ventricle: Left ventricular ejection fraction, by estimation, is 55 to 60%. The left ventricle has normal function. Left ventricular endocardial border not optimally defined to evaluate regional wall motion. The left ventricular internal cavity size was normal in size. There is no left ventricular hypertrophy. The interventricular septum is flattened in diastole ('D' shaped left ventricle), consistent with right ventricular volume overload. Left ventricular diastolic parameters are consistent with Grade II diastolic dysfunction (pseudonormalization). Right Ventricle: The right ventricular size is moderately enlarged. No increase in right ventricular wall thickness. Right ventricular systolic function is moderately reduced. There is mildly  elevated pulmonary artery systolic pressure. Left Atrium: Left atrial size was mildly dilated. Right Atrium: Right atrial size was mildly dilated. Pericardium: There is no evidence of pericardial effusion. Mitral Valve: The mitral valve is abnormal. There is mild calcification of the mitral valve leaflet(s). Mild mitral annular calcification. Mild mitral valve regurgitation. Tricuspid Valve: The tricuspid valve is grossly normal. Tricuspid valve regurgitation is trivial. Aortic Valve: The aortic valve is abnormal. Aortic valve regurgitation is not visualized. Mild aortic valve sclerosis is present, with no evidence  of aortic valve stenosis. Aortic valve peak gradient measures 4.3 mmHg. Pulmonic Valve: The pulmonic valve was normal in structure. Pulmonic valve regurgitation is not visualized. Aorta: Aortic root could not be assessed. IAS/Shunts: No atrial level shunt detected by color flow Doppler.  LEFT VENTRICLE PLAX 2D LVIDd:         4.47 cm  Diastology LVIDs:         3.14 cm  LV e' medial:    4.57 cm/s LV PW:         1.13 cm  LV E/e' medial:  10.9 LV IVS:        0.96 cm  LV e' lateral:   6.09 cm/s LVOT diam:     2.00 cm  LV E/e' lateral: 8.2 LV SV:         36 LV SV Index:   26 LVOT Area:     3.14 cm  RIGHT VENTRICLE RV Basal diam:  4.14 cm RV S prime:     8.59 cm/s TAPSE (M-mode): 1.7 cm LEFT ATRIUM             Index       RIGHT ATRIUM           Index LA diam:        3.80 cm 2.71 cm/m  RA Area:     23.40 cm LA Vol (A2C):   31.7 ml 22.65 ml/m RA Volume:   79.60 ml  56.87 ml/m LA Vol (A4C):   22.8 ml 16.29 ml/m LA Biplane Vol: 26.9 ml 19.22 ml/m  AORTIC VALVE AV Area (Vmax): 1.72 cm AV Vmax:        104.00 cm/s AV Peak Grad:   4.3 mmHg LVOT Vmax:      56.80 cm/s LVOT Vmean:     33.500 cm/s LVOT VTI:       0.115 m  AORTA Ao Root diam: 3.00 cm Ao Asc diam:  2.90 cm MITRAL VALVE                TRICUSPID VALVE MV Area (PHT): 2.48 cm     TV Peak grad:   38.9 mmHg MV Decel Time: 306 msec     TV Vmax:        3.12  m/s MV E velocity: 49.70 cm/s MV A velocity: 100.00 cm/s  SHUNTS MV E/A ratio:  0.50         Systemic VTI:  0.12 m                             Systemic Diam: 2.00 cm Dwayne Prince [place] MD Electronically signed by Yolonda Kida MD Signature Date/Time: 04/30/2020/2:58:54 PM    Final        Subjective: pt c/o fatigue   Discharge Exam: Vitals:   05/02/20 0805 05/02/20 1228  BP:  108/68  Pulse:  (!) 102  Resp:    Temp:  97.7 F (36.5 C)  SpO2: 96% 97%   Vitals:   05/02/20 0500 05/02/20 0557 05/02/20 0805 05/02/20 1228  BP:  103/60  108/68  Pulse:  72  (!) 102  Resp:  20    Temp:  98.4 F (36.9 C)  97.7 F (36.5 C)  TempSrc:  Oral  Oral  SpO2:  96% 96% 97%  Weight: 46.7 kg     Height:        General: Pt is alert, awake, not in acute distress Cardiovascular: S1/S2 +,  no rubs, no gallops Respiratory: diminished breath sounds b/l otherwise clear  Abdominal: Soft, NT, ND, bowel sounds + Extremities: no edema, no cyanosis    The results of significant diagnostics from this hospitalization (including imaging, microbiology, ancillary and laboratory) are listed below for reference.     Microbiology: Recent Results (from the past 240 hour(s))  SARS Coronavirus 2 by RT PCR (hospital order, performed in [facility] hospital lab) Nasopharyngeal Nasopharyngeal Swab     Status: None   Collection Time: 04/29/20  8:47 PM   Specimen: Nasopharyngeal Swab  Result Value Ref Range Status   SARS Coronavirus 2 NEGATIVE NEGATIVE Final    Comment: (NOTE) SARS-CoV-2 target nucleic acids are NOT DETECTED.  The SARS-CoV-2 RNA is generally detectable in upper and lower respiratory specimens during the acute phase of infection. The lowest concentration of SARS-CoV-2 viral copies this assay can detect is 250 copies / mL. A negative result does not preclude SARS-CoV-2 infection and should not be used as the sole basis for treatment or other patient management decisions.  A negative result may  occur with improper specimen collection / handling, submission of specimen other than nasopharyngeal swab, presence of viral mutation(s) within the areas targeted by this assay, and inadequate number of viral copies (<250 copies / mL). A negative result must be combined with clinical observations, patient history, and epidemiological information.  Fact Sheet for Patients:   StrictlyIdeas.no  Fact Sheet for Healthcare Providers: BankingDealers.co.za  This test is not yet approved or  cleared by the [place] FDA and has been authorized for detection and/or diagnosis of SARS-CoV-2 by FDA under an Emergency Use Authorization (EUA).  This EUA will remain in effect (meaning this test can be used) for the duration of the COVID-19 declaration under Section 564(b)(1) of the Act, 21 U.S.C. section 360bbb-3(b)(1), unless the authorization is terminated or revoked sooner.  Performed at [facility], [address] [place], [place] [address]      Labs: BNP (last 3 results) Recent Labs    03/24/20 0939 03/31/20 1609 04/29/20 1842  BNP 1,570.9* 659.7* 2,694.8*   Basic Metabolic Panel: Recent Labs  Lab 04/29/20 1842 04/30/20 0500 05/01/20 0629 05/02/20 0703  NA 125* 131* 131* 127*  K 4.2 3.5 4.4 4.3  CL 91* 92* 94* 89*  CO2 25 28 27 27   GLUCOSE 124* 95 94 106*  BUN 24* 23 27* 31*  CREATININE 1.19* 1.00 1.06* 1.25*  CALCIUM 8.9 8.8* 8.9 9.0   Liver Function Tests: No results for input(s): AST, ALT, ALKPHOS, BILITOT, PROT, ALBUMIN in the last 168 hours. No results for input(s): LIPASE, AMYLASE in the last 168 hours. No results for input(s): AMMONIA in the last 168 hours. CBC: Recent Labs  Lab 04/29/20 1842 04/30/20 0500 05/01/20 0629 05/02/20 0703  WBC 10.6* 9.6 10.7* 18.2*  NEUTROABS  --  7.9*  --   --   HGB 11.5* 12.4 13.1 13.1  HCT 33.4* 35.2* 37.6 38.5  MCV 79.3* 81.1 81.9 79.9*  PLT 292 300 295 270    Cardiac Enzymes: No results for input(s): CKTOTAL, CKMB, CKMBINDEX, TROPONINI in the last 168 hours. BNP: Invalid input(s): POCBNP CBG: No results for input(s): GLUCAP in the last 168 hours. D-Dimer No results for input(s): DDIMER in the last 72 hours. Hgb A1c No results for input(s): HGBA1C in the last 72 hours. Lipid Profile No results for input(s): CHOL, HDL, LDLCALC, TRIG, CHOLHDL, LDLDIRECT in the last 72 hours. Thyroid function studies No results for  input(s): TSH, T4TOTAL, T3FREE, THYROIDAB in the last 72 hours.  Invalid input(s): FREET3 Anemia work up No results for input(s): VITAMINB12, FOLATE, FERRITIN, TIBC, IRON, RETICCTPCT in the last 72 hours. Urinalysis No results found for: COLORURINE, APPEARANCEUR, [place], [place], [place], [place], [place], [place], PROTEINUR, UROBILINOGEN, NITRITE, LEUKOCYTESUR Sepsis Labs Invalid input(s): PROCALCITONIN,  WBC,  LACTICIDVEN Microbiology Recent Results (from the past 240 hour(s))  SARS Coronavirus 2 by RT PCR (hospital order, performed in [facility] hospital lab) Nasopharyngeal Nasopharyngeal Swab     Status: None   Collection Time: 04/29/20  8:47 PM   Specimen: Nasopharyngeal Swab  Result Value Ref Range Status   SARS Coronavirus 2 NEGATIVE NEGATIVE Final    Comment: (NOTE) SARS-CoV-2 target nucleic acids are NOT DETECTED.  The SARS-CoV-2 RNA is generally detectable in upper and lower respiratory specimens during the acute phase of infection. The lowest concentration of SARS-CoV-2 viral copies this assay can detect is 250 copies / mL. A negative result does not preclude SARS-CoV-2 infection and should not be used as the sole basis for treatment or other patient management decisions.  A negative result may occur with improper specimen collection / handling, submission of specimen other than nasopharyngeal swab, presence of viral mutation(s) within the areas targeted by this assay, and inadequate number of viral  copies (<250 copies / mL). A negative result must be combined with clinical observations, patient history, and epidemiological information.  Fact Sheet for Patients:   StrictlyIdeas.no  Fact Sheet for Healthcare Providers: BankingDealers.co.za  This test is not yet approved or  cleared by the [place] FDA and has been authorized for detection and/or diagnosis of SARS-CoV-2 by FDA under an Emergency Use Authorization (EUA).  This EUA will remain in effect (meaning this test can be used) for the duration of the COVID-19 declaration under Section 564(b)(1) of the Act, 21 U.S.C. section 360bbb-3(b)(1), unless the authorization is terminated or revoked sooner.  Performed at [facility], [address] [place], [place] [address]      Time coordinating discharge: Over 30 minutes  SIGNED:   Wyvonnia Dusky, MD  [facility] 05/02/2020, 2:01 PM Pager   If 7PM-7AM, please contact night-coverage [email or web address]

## 2020-05-02 NOTE — TOC Transition Note (Signed)
Transition of Care [facility] - CM/SW Discharge Note   Patient Details  Name: ORLENA GARMON MRN: [id number] Date of Birth: 1931-11-01  [facility] CM/SW Contact:  Beverly Sessions, RN Phone Number: 05/02/2020, 1:09 PM   Clinical Narrative:     Patient to discharge home today PT has worked with patient and recommends home health.   Discussed with daughter Jenny Reichmann and she and the patient are in agreement.  Do not have a preference of home health agency  Patient has RW, rollator, cane, home O2, and scale to check daily weights  Referral made and accepted by Corene Cornea at [facility]   Final next level of care: [place] Barriers to Discharge: No Barriers Identified   Patient Goals and CMS Choice     Choice offered to / list presented to : Patient  Discharge Placement                       Discharge Plan and Services   Discharge Planning Services: CM Consult                      HH Arranged: PT, RN, Nurse's Aide [facility] Agency: [place] ([place]) Date [facility] Agency Contacted: 05/02/20   Representative spoke with at [place]: [place] ([place]) Interventions     Readmission Risk Interventions No flowsheet data found.

## 2020-05-02 NOTE — Progress Notes (Addendum)
Initial Nutrition Assessment  DOCUMENTATION CODES:   Severe malnutrition in context of chronic illness  INTERVENTION:   Recommend Ensure Enlive po BID, each supplement provides 350 kcal and 20 grams of protein  Recommend MVI daily   Liberalize diet   NUTRITION DIAGNOSIS:   Severe Malnutrition related to chronic illness (COPD, lung cancer) as evidenced by severe fat depletion, severe muscle depletion.  GOAL:   Patient will meet greater than or equal to 90% of their needs  MONITOR:   PO intake, Supplement acceptance, Labs, Weight trends, Skin, I & O's  REASON FOR ASSESSMENT:   Malnutrition Screening Tool    ASSESSMENT:   86 y.o. female with medical history significant of COPD, lung cancer status post right lower lobectomy, hyperlipidemia, hypertension, osteoarthritis, CHF, hyperlipidemia and osteoarthritis who is admitted with CHF   Met with pt in room today. Pt reports poor appetite and oral intake at baseline. Pt reports that her appetite has been decreasing slowly over the past few years and has really declined since her lung cancer diagnoses and treatment. Pt reports that she does drink chocolate protein supplements at home but she does not remember what brand (usually drinks 1-2 per day). Per chart, pt is down 7lbs(6%) since September 2020; RD unsure how recently weight loss occurred. RD will liberalize the heart healthy portion of pt's diet as this is restrictive of protein. RD will hold off on ordering supplements as pt is discharging today; will order supplements if pt does not discharge. Pt documented to have eaten 60% of her breakfast this morning.    Medications reviewed and include: lovenox, lasix, Mg hydroxide  Labs reviewed: Na 127(L), BUN 31(H), creat 1.25(H) Wbc- 18.2(H)  NUTRITION - FOCUSED PHYSICAL EXAM:    Most Recent Value  Orbital Region Severe depletion  Upper Arm Region Severe depletion  Thoracic and Lumbar Region Severe depletion  Buccal Region  Severe depletion  Temple Region Severe depletion  Clavicle Bone Region Severe depletion  Clavicle and Acromion Bone Region Severe depletion  Scapular Bone Region Severe depletion  Dorsal Hand Severe depletion  Patellar Region Severe depletion  Anterior Thigh Region Severe depletion  Posterior Calf Region Severe depletion  Edema (RD Assessment) None  Hair Reviewed  Eyes Reviewed  Mouth Reviewed  Skin Reviewed  Nails Reviewed     Diet Order:   Diet Order            Diet 2 gram sodium Room service appropriate? Yes; Fluid consistency: Thin  Diet effective now           Diet - low sodium heart healthy                EDUCATION NEEDS:   Education needs have been addressed  Skin:  Skin Assessment: Reviewed RN Assessment (ecchymosis)  Last BM:  9/9  Height:   Ht Readings from Last 1 Encounters:  04/29/20 5' (1.524 m)    Weight:   Wt Readings from Last 1 Encounters:  05/02/20 46.7 kg    Ideal Body Weight:  45.45 kg  BMI:  Body mass index is 20.11 kg/m.  Estimated Nutritional Needs:   Kcal:  1200-1400kcal/day  Protein:  60-70g/day  Fluid:  1.2L/day  Koleen Distance MS, RD, LDN Please refer to [facility] for RD and/or RD on-call/weekend/after hours pager

## 2020-05-02 NOTE — Progress Notes (Signed)
Mobility Specialist - Progress Note   05/02/20 1146  Mobility  Activity Transferred:  Bed to chair;Transferred:  Chair to bed  Level of Assistance Minimal assist, patient does 75% or more  Assistive Device Front wheel walker  Distance Ambulated (ft) 6 ft  Mobility Response Tolerated well  Mobility performed by Mobility specialist  $Mobility charge 1 Mobility    Pre-mobility: 97 HR, 106/67 BP, 94% SpO2 Post-mobility: 99 HR, 116/49 BP, 93% SpO2   Pt was lying in bed upon arrival. Pt agreed to session. Pt denied any pain, dizziness, or fatigue. Pt requested assistance changing bed padding. Pt was SBA getting EOB and minA in sit-to-stand and while ambulating with RW. Pt needed extra time to correct upright posture before ambulation. Pt was able to transfer from recliner to bed with minA. Overall, pt tolerated session well. Pt was left in bed with all needs in reach.    Kathee Delton Mobility Specialist 05/02/20, 11:52 AM

## 2020-05-02 NOTE — Progress Notes (Signed)
Verdis Prime to be D/C'd Home per MD order.  Discussed prescriptions and follow up appointments with the patient. Prescriptions given to patient, medication list explained in detail. Pt verbalized understanding.  Allergies as of 05/02/2020      Reactions   Tramadol    confusion      Medication List    TAKE these medications   acetaminophen 325 MG tablet Commonly known as: TYLENOL Take 650 mg by mouth as needed for mild pain.   albuterol 108 (90 Base) MCG/ACT inhaler Commonly known as: VENTOLIN HFA Inhale 2 puffs into the lungs every 4 (four) hours as needed for wheezing or shortness of breath.   benzonatate 200 MG capsule Commonly known as: TESSALON Take 1 capsule (200 mg total) by mouth 3 (three) times daily as needed for up to 7 days for cough. Notes to patient: Last dose give 05/02/20 at 9am   calcium carbonate 750 MG chewable tablet Commonly known as: TUMS EX Chew 1 tablet by mouth as needed for heartburn.   carvedilol 3.125 MG tablet Commonly known as: COREG Take 1 tablet (3.125 mg total) by mouth 2 (two) times daily with a meal.   cholecalciferol 25 MCG (1000 UNIT) tablet Commonly known as: VITAMIN D3 Take 2,000 Units by mouth daily.   diphenhydrAMINE 25 mg capsule Commonly known as: BENADRYL Take 25 mg by mouth at bedtime as needed for itching.   Fluticasone-Salmeterol 250-50 MCG/DOSE Aepb Commonly known as: ADVAIR Inhale 1 puff into the lungs 2 (two) times daily.   Wixela Inhub 250-50 MCG/DOSE Aepb Generic drug: Fluticasone-Salmeterol Inhale 1 puff into the lungs every 12 (twelve) hours.   furosemide 40 MG tablet Commonly known as: LASIX Take 1 tablet (40 mg total) by mouth daily. What changed:   medication strength  how much to take  when to take this  reasons to take this   lisinopril 2.5 MG tablet Commonly known as: ZESTRIL Take 1 tablet (2.5 mg total) by mouth daily.   potassium chloride 10 MEQ tablet Commonly known as: KLOR-CON Take 1  tablet (10 mEq total) by mouth daily. While taking lasix            Durable Medical Equipment  (From admission, onward)         Start     Ordered   05/02/20 0759  For home use only DME 3 n 1  Once        05/02/20 0758          Vitals:   05/02/20 0805 05/02/20 1228  BP:  108/68  Pulse:  (!) 102  Resp:    Temp:  97.7 F (36.5 C)  SpO2: 96% 97%    Skin clean, dry and intact without evidence of skin break down, no evidence of skin tears noted. IV catheter discontinued intact. Site without signs and symptoms of complications. Dressing and pressure applied. Pt denies pain at this time. No complaints noted.  An After Visit Summary was printed and given to the patient. Patient escorted via [place], and D/C home via private auto.  Fuller Mandril, RN

## 2020-05-04 ENCOUNTER — Telehealth: Payer: Self-pay | Admitting: Family

## 2020-05-04 NOTE — Telephone Encounter (Signed)
LVM with patient regarding her new patient appointment for 9/28 with [facility] that was made after her recent hospital stay and to follow up with patient to see how she is feeling now that she is home but was unable to reach.   Stephanie Collins, NT

## 2020-05-10 ENCOUNTER — Other Ambulatory Visit
Admission: RE | Admit: 2020-05-10 | Discharge: 2020-05-10 | Disposition: A | Discharge status: Home / Self Care | Payer: Medicare Other | Source: Ambulatory Visit | Attending: Cardiology | Admitting: Cardiology

## 2020-05-10 DIAGNOSIS — I5031 Acute diastolic (congestive) heart failure: Secondary | ICD-10-CM | POA: Diagnosis present

## 2020-05-10 LAB — BRAIN NATRIURETIC PEPTIDE: B Natriuretic Peptide: 2378.3 pg/mL — ABNORMAL HIGH (ref 0.0–100.0)

## 2020-05-14 ENCOUNTER — Inpatient Hospital Stay
Admission: EM | Admit: 2020-05-14 | Discharge: 2020-06-20 | DRG: 871 | Disposition: E | Payer: Medicare Other | Attending: Internal Medicine | Admitting: Internal Medicine

## 2020-05-14 ENCOUNTER — Emergency Department: Payer: Medicare Other

## 2020-05-14 ENCOUNTER — Other Ambulatory Visit: Payer: Self-pay

## 2020-05-14 DIAGNOSIS — Z9981 Dependence on supplemental oxygen: Secondary | ICD-10-CM

## 2020-05-14 DIAGNOSIS — Z7189 Other specified counseling: Secondary | ICD-10-CM | POA: Diagnosis not present

## 2020-05-14 DIAGNOSIS — I4891 Unspecified atrial fibrillation: Secondary | ICD-10-CM | POA: Diagnosis not present

## 2020-05-14 DIAGNOSIS — Z515 Encounter for palliative care: Secondary | ICD-10-CM | POA: Diagnosis not present

## 2020-05-14 DIAGNOSIS — G9341 Metabolic encephalopathy: Secondary | ICD-10-CM | POA: Diagnosis present

## 2020-05-14 DIAGNOSIS — Y95 Nosocomial condition: Secondary | ICD-10-CM | POA: Diagnosis present

## 2020-05-14 DIAGNOSIS — N179 Acute kidney failure, unspecified: Secondary | ICD-10-CM | POA: Diagnosis present

## 2020-05-14 DIAGNOSIS — A419 Sepsis, unspecified organism: Secondary | ICD-10-CM | POA: Diagnosis present

## 2020-05-14 DIAGNOSIS — Z79899 Other long term (current) drug therapy: Secondary | ICD-10-CM

## 2020-05-14 DIAGNOSIS — I10 Essential (primary) hypertension: Secondary | ICD-10-CM | POA: Diagnosis present

## 2020-05-14 DIAGNOSIS — Z87891 Personal history of nicotine dependence: Secondary | ICD-10-CM | POA: Diagnosis not present

## 2020-05-14 DIAGNOSIS — K1321 Leukoplakia of oral mucosa, including tongue: Secondary | ICD-10-CM | POA: Diagnosis present

## 2020-05-14 DIAGNOSIS — J9621 Acute and chronic respiratory failure with hypoxia: Secondary | ICD-10-CM | POA: Diagnosis present

## 2020-05-14 DIAGNOSIS — Z902 Acquired absence of lung [part of]: Secondary | ICD-10-CM

## 2020-05-14 DIAGNOSIS — Z85118 Personal history of other malignant neoplasm of bronchus and lung: Secondary | ICD-10-CM

## 2020-05-14 DIAGNOSIS — R64 Cachexia: Secondary | ICD-10-CM | POA: Diagnosis present

## 2020-05-14 DIAGNOSIS — Z888 Allergy status to other drugs, medicaments and biological substances status: Secondary | ICD-10-CM

## 2020-05-14 DIAGNOSIS — I130 Hypertensive heart and chronic kidney disease with heart failure and stage 1 through stage 4 chronic kidney disease, or unspecified chronic kidney disease: Secondary | ICD-10-CM | POA: Diagnosis present

## 2020-05-14 DIAGNOSIS — Z681 Body mass index (BMI) 19 or less, adult: Secondary | ICD-10-CM

## 2020-05-14 DIAGNOSIS — N3000 Acute cystitis without hematuria: Secondary | ICD-10-CM

## 2020-05-14 DIAGNOSIS — R0600 Dyspnea, unspecified: Secondary | ICD-10-CM

## 2020-05-14 DIAGNOSIS — E860 Dehydration: Secondary | ICD-10-CM | POA: Diagnosis present

## 2020-05-14 DIAGNOSIS — Z20822 Contact with and (suspected) exposure to covid-19: Secondary | ICD-10-CM | POA: Diagnosis present

## 2020-05-14 DIAGNOSIS — I482 Chronic atrial fibrillation, unspecified: Secondary | ICD-10-CM | POA: Diagnosis present

## 2020-05-14 DIAGNOSIS — B9620 Unspecified Escherichia coli [E. coli] as the cause of diseases classified elsewhere: Secondary | ICD-10-CM | POA: Diagnosis present

## 2020-05-14 DIAGNOSIS — R531 Weakness: Secondary | ICD-10-CM | POA: Diagnosis not present

## 2020-05-14 DIAGNOSIS — R778 Other specified abnormalities of plasma proteins: Secondary | ICD-10-CM | POA: Diagnosis not present

## 2020-05-14 DIAGNOSIS — E871 Hypo-osmolality and hyponatremia: Secondary | ICD-10-CM | POA: Diagnosis present

## 2020-05-14 DIAGNOSIS — N1831 Chronic kidney disease, stage 3a: Secondary | ICD-10-CM | POA: Diagnosis present

## 2020-05-14 DIAGNOSIS — J189 Pneumonia, unspecified organism: Secondary | ICD-10-CM | POA: Diagnosis not present

## 2020-05-14 DIAGNOSIS — J440 Chronic obstructive pulmonary disease with acute lower respiratory infection: Secondary | ICD-10-CM | POA: Diagnosis present

## 2020-05-14 DIAGNOSIS — R54 Age-related physical debility: Secondary | ICD-10-CM | POA: Diagnosis present

## 2020-05-14 DIAGNOSIS — N390 Urinary tract infection, site not specified: Secondary | ICD-10-CM | POA: Diagnosis present

## 2020-05-14 DIAGNOSIS — K449 Diaphragmatic hernia without obstruction or gangrene: Secondary | ICD-10-CM | POA: Diagnosis present

## 2020-05-14 DIAGNOSIS — J441 Chronic obstructive pulmonary disease with (acute) exacerbation: Secondary | ICD-10-CM | POA: Diagnosis not present

## 2020-05-14 DIAGNOSIS — I248 Other forms of acute ischemic heart disease: Secondary | ICD-10-CM | POA: Diagnosis present

## 2020-05-14 DIAGNOSIS — J449 Chronic obstructive pulmonary disease, unspecified: Secondary | ICD-10-CM | POA: Diagnosis present

## 2020-05-14 DIAGNOSIS — B964 Proteus (mirabilis) (morganii) as the cause of diseases classified elsewhere: Secondary | ICD-10-CM | POA: Diagnosis present

## 2020-05-14 DIAGNOSIS — E875 Hyperkalemia: Secondary | ICD-10-CM | POA: Diagnosis not present

## 2020-05-14 DIAGNOSIS — I5032 Chronic diastolic (congestive) heart failure: Secondary | ICD-10-CM | POA: Diagnosis present

## 2020-05-14 DIAGNOSIS — Z66 Do not resuscitate: Secondary | ICD-10-CM | POA: Diagnosis present

## 2020-05-14 DIAGNOSIS — E43 Unspecified severe protein-calorie malnutrition: Secondary | ICD-10-CM | POA: Diagnosis present

## 2020-05-14 DIAGNOSIS — F419 Anxiety disorder, unspecified: Secondary | ICD-10-CM | POA: Diagnosis present

## 2020-05-14 LAB — BASIC METABOLIC PANEL
Anion gap: 8 (ref 5–15)
BUN: 56 mg/dL — ABNORMAL HIGH (ref 8–23)
CO2: 28 mmol/L (ref 22–32)
Calcium: 8.2 mg/dL — ABNORMAL LOW (ref 8.9–10.3)
Chloride: 94 mmol/L — ABNORMAL LOW (ref 98–111)
Creatinine, Ser: 1.38 mg/dL — ABNORMAL HIGH (ref 0.44–1.00)
GFR calc Af Amer: 40 mL/min — ABNORMAL LOW (ref >60)
GFR calc non Af Amer: 35 mL/min — ABNORMAL LOW (ref >60)
Glucose, Bld: 141 mg/dL — ABNORMAL HIGH (ref 70–99)
Potassium: 4.3 mmol/L (ref 3.5–5.1)
Sodium: 130 mmol/L — ABNORMAL LOW (ref 135–145)

## 2020-05-14 LAB — TROPONIN I (HIGH SENSITIVITY)
Troponin I (High Sensitivity): 125 ng/L (ref <18)
Troponin I (High Sensitivity): 630 ng/L (ref <18)
Troponin I (High Sensitivity): 1105 ng/L (ref <18)

## 2020-05-14 LAB — CBC
HCT: 35.7 % — ABNORMAL LOW (ref 36.0–46.0)
Hemoglobin: 12.3 g/dL (ref 12.0–15.0)
MCH: 27.4 pg (ref 26.0–34.0)
MCHC: 34.5 g/dL (ref 30.0–36.0)
MCV: 79.5 fL — ABNORMAL LOW (ref 80.0–100.0)
Platelets: 372 10*3/uL (ref 150–400)
RBC: 4.49 MIL/uL (ref 3.87–5.11)
RDW: 15.6 % — ABNORMAL HIGH (ref 11.5–15.5)
WBC: 22.7 10*3/uL — ABNORMAL HIGH (ref 4.0–10.5)
nRBC: 0 % (ref 0.0–0.2)

## 2020-05-14 LAB — RESPIRATORY PANEL BY RT PCR (FLU A&B, COVID)
Influenza A by PCR: NEGATIVE
Influenza B by PCR: NEGATIVE
SARS Coronavirus 2 by RT PCR: NEGATIVE

## 2020-05-14 LAB — MRSA PCR SCREENING: MRSA by PCR: POSITIVE — AB

## 2020-05-14 LAB — OSMOLALITY, URINE: Osmolality, Ur: 530 mOsm/kg (ref 300–900)

## 2020-05-14 LAB — URINALYSIS, COMPLETE (UACMP) WITH MICROSCOPIC
Bilirubin Urine: NEGATIVE
Glucose, UA: NEGATIVE mg/dL
Hgb urine dipstick: NEGATIVE
Ketones, ur: NEGATIVE mg/dL
Nitrite: NEGATIVE
Protein, ur: 30 mg/dL — AB
Specific Gravity, Urine: 1.013 (ref 1.005–1.030)
WBC, UA: >50 WBC/hpf — ABNORMAL HIGH (ref 0–5)
pH: 8 (ref 5.0–8.0)

## 2020-05-14 LAB — COMPREHENSIVE METABOLIC PANEL
ALT: 15 U/L (ref 0–44)
AST: 19 U/L (ref 15–41)
Albumin: 2.7 g/dL — ABNORMAL LOW (ref 3.5–5.0)
Alkaline Phosphatase: 113 U/L (ref 38–126)
Anion gap: 13 (ref 5–15)
BUN: 58 mg/dL — ABNORMAL HIGH (ref 8–23)
CO2: 26 mmol/L (ref 22–32)
Calcium: 8.8 mg/dL — ABNORMAL LOW (ref 8.9–10.3)
Chloride: 89 mmol/L — ABNORMAL LOW (ref 98–111)
Creatinine, Ser: 1.51 mg/dL — ABNORMAL HIGH (ref 0.44–1.00)
GFR calc Af Amer: 36 mL/min — ABNORMAL LOW (ref >60)
GFR calc non Af Amer: 31 mL/min — ABNORMAL LOW (ref >60)
Glucose, Bld: 101 mg/dL — ABNORMAL HIGH (ref 70–99)
Potassium: 4.8 mmol/L (ref 3.5–5.1)
Sodium: 128 mmol/L — ABNORMAL LOW (ref 135–145)
Total Bilirubin: 0.9 mg/dL (ref 0.3–1.2)
Total Protein: 6.5 g/dL (ref 6.5–8.1)

## 2020-05-14 LAB — PROTIME-INR
INR: 1.1 (ref 0.8–1.2)
Prothrombin Time: 13.8 seconds (ref 11.4–15.2)

## 2020-05-14 LAB — APTT: aPTT: 34 seconds (ref 24–36)

## 2020-05-14 LAB — SODIUM, URINE, RANDOM: Sodium, Ur: 13 mmol/L

## 2020-05-14 LAB — LACTIC ACID, PLASMA: Lactic Acid, Venous: 1.3 mmol/L (ref 0.5–1.9)

## 2020-05-14 LAB — OSMOLALITY: Osmolality: 283 mOsm/kg (ref 275–295)

## 2020-05-14 LAB — BRAIN NATRIURETIC PEPTIDE: B Natriuretic Peptide: 1748.1 pg/mL — ABNORMAL HIGH (ref 0.0–100.0)

## 2020-05-14 LAB — PROCALCITONIN: Procalcitonin: 0.23 ng/mL

## 2020-05-14 MED ORDER — SODIUM CHLORIDE 0.9 % IV SOLN
500.0000 mg | INTRAVENOUS | Status: DC
  Filled 2020-05-14 (×2): qty 500

## 2020-05-14 MED ORDER — HEPARIN (PORCINE) 25000 UT/250ML-% IV SOLN
650.0000 [IU]/h | INTRAVENOUS | Status: DC
  Administered 2020-05-14: 500 [IU]/h via INTRAVENOUS
  Filled 2020-05-14: qty 250

## 2020-05-14 MED ORDER — SODIUM CHLORIDE 0.9 % IV SOLN
1.0000 g | Freq: Once | INTRAVENOUS | Status: AC
  Administered 2020-05-14: 1 g via INTRAVENOUS
  Filled 2020-05-14: qty 1

## 2020-05-14 MED ORDER — CALCIUM CARBONATE ANTACID 500 MG PO CHEW: 1.5000 | CHEWABLE_TABLET | ORAL | Status: DC | PRN

## 2020-05-14 MED ORDER — ASPIRIN 81 MG PO CHEW
324.0000 mg | CHEWABLE_TABLET | Freq: Once | ORAL | Status: AC
  Administered 2020-05-14: 324 mg via ORAL
  Filled 2020-05-14: qty 4

## 2020-05-14 MED ORDER — VITAMIN D 25 MCG (1000 UNIT) PO TABS
2000.0000 [IU] | ORAL_TABLET | Freq: Every day | ORAL | Status: DC
  Administered 2020-05-15 – 2020-05-24 (×10): 2000 [IU] via ORAL
  Filled 2020-05-14 (×10): qty 2

## 2020-05-14 MED ORDER — ONDANSETRON HCL 4 MG/2ML IJ SOLN: 4.0000 mg | Freq: Three times a day (TID) | INTRAMUSCULAR | Status: DC | PRN

## 2020-05-14 MED ORDER — ALBUTEROL SULFATE (2.5 MG/3ML) 0.083% IN NEBU: 2.5000 mg | INHALATION_SOLUTION | RESPIRATORY_TRACT | Status: DC | PRN

## 2020-05-14 MED ORDER — ACETYLCYSTEINE 20 % IN SOLN
3.0000 mL | Freq: Two times a day (BID) | RESPIRATORY_TRACT | Status: DC
  Administered 2020-05-15 – 2020-05-23 (×15): 3 mL via RESPIRATORY_TRACT
  Administered 2020-05-23: 4 mL via RESPIRATORY_TRACT
  Administered 2020-05-24: 3 mL via RESPIRATORY_TRACT
  Filled 2020-05-14 (×22): qty 4

## 2020-05-14 MED ORDER — ASPIRIN EC 81 MG PO TBEC
81.0000 mg | DELAYED_RELEASE_TABLET | Freq: Every day | ORAL | Status: DC
  Administered 2020-05-15 – 2020-05-24 (×10): 81 mg via ORAL
  Filled 2020-05-14 (×10): qty 1

## 2020-05-14 MED ORDER — VANCOMYCIN HCL IN DEXTROSE 1-5 GM/200ML-% IV SOLN
1000.0000 mg | Freq: Once | INTRAVENOUS | Status: AC
  Administered 2020-05-14: 1000 mg via INTRAVENOUS

## 2020-05-14 MED ORDER — DM-GUAIFENESIN ER 30-600 MG PO TB12
1.0000 | ORAL_TABLET | Freq: Two times a day (BID) | ORAL | Status: DC | PRN
  Administered 2020-05-15 – 2020-05-18 (×2): 1 via ORAL
  Filled 2020-05-14 (×2): qty 1

## 2020-05-14 MED ORDER — SODIUM CHLORIDE 0.9% FLUSH
3.0000 mL | Freq: Once | INTRAVENOUS | Status: AC
  Administered 2020-05-14: 3 mL via INTRAVENOUS

## 2020-05-14 MED ORDER — MUPIROCIN 2 % EX OINT
1.0000 "application " | TOPICAL_OINTMENT | Freq: Two times a day (BID) | CUTANEOUS | Status: AC
  Administered 2020-05-15 – 2020-05-19 (×9): 1 via NASAL
  Filled 2020-05-14: qty 22

## 2020-05-14 MED ORDER — IPRATROPIUM-ALBUTEROL 0.5-2.5 (3) MG/3ML IN SOLN
3.0000 mL | RESPIRATORY_TRACT | Status: DC
  Administered 2020-05-14 – 2020-05-16 (×8): 3 mL via RESPIRATORY_TRACT
  Filled 2020-05-14 (×8): qty 3

## 2020-05-14 MED ORDER — HEPARIN BOLUS VIA INFUSION
2000.0000 [IU] | Freq: Once | INTRAVENOUS | Status: AC
  Administered 2020-05-14: 2000 [IU] via INTRAVENOUS
  Filled 2020-05-14: qty 2000

## 2020-05-14 MED ORDER — ACETAMINOPHEN 325 MG PO TABS
650.0000 mg | ORAL_TABLET | Freq: Four times a day (QID) | ORAL | Status: DC | PRN
  Administered 2020-05-17 – 2020-05-24 (×3): 650 mg via ORAL
  Filled 2020-05-14 (×3): qty 2

## 2020-05-14 MED ORDER — SODIUM CHLORIDE 0.9 % IV SOLN
2.0000 g | INTRAVENOUS | Status: DC
  Administered 2020-05-14: 2 g via INTRAVENOUS
  Filled 2020-05-14: qty 20

## 2020-05-14 MED ORDER — DIPHENHYDRAMINE HCL 25 MG PO CAPS: 25.0000 mg | ORAL_CAPSULE | Freq: Every evening | ORAL | Status: DC | PRN

## 2020-05-14 MED ORDER — VANCOMYCIN HCL 500 MG/100ML IV SOLN: 500.0000 mg | INTRAVENOUS | Status: DC

## 2020-05-14 MED ORDER — SODIUM CHLORIDE 0.9 % IV SOLN
2.0000 g | INTRAVENOUS | Status: DC
  Administered 2020-05-15 – 2020-05-18 (×4): 2 g via INTRAVENOUS
  Filled 2020-05-14 (×5): qty 2

## 2020-05-14 MED ORDER — ATORVASTATIN CALCIUM 20 MG PO TABS
40.0000 mg | ORAL_TABLET | Freq: Every day | ORAL | Status: DC
  Administered 2020-05-15 – 2020-05-24 (×10): 40 mg via ORAL
  Filled 2020-05-14 (×10): qty 2

## 2020-05-14 MED ORDER — SODIUM CHLORIDE 0.9 % IV BOLUS (SEPSIS)
500.0000 mL | Freq: Once | INTRAVENOUS | Status: AC
  Administered 2020-05-14: 500 mL via INTRAVENOUS

## 2020-05-14 MED ORDER — CHLORHEXIDINE GLUCONATE CLOTH 2 % EX PADS
6.0000 | MEDICATED_PAD | Freq: Every day | CUTANEOUS | Status: AC
  Administered 2020-05-17 – 2020-05-19 (×2): 6 via TOPICAL
  Filled 2020-05-14 (×2): qty 6

## 2020-05-14 MED ORDER — ENOXAPARIN SODIUM 40 MG/0.4ML ~~LOC~~ SOLN: 40.0000 mg | SUBCUTANEOUS | Status: DC

## 2020-05-14 NOTE — Consult Note (Signed)
ANTICOAGULATION CONSULT NOTE - Initial Consult  Pharmacy Consult for Heparin Infusion Indication: chest pain/ACS and atrial fibrillation  Allergies  Allergen Reactions  . Tramadol     confusion    Patient Measurements: Height: 5' (152.4 cm) Weight: 42.2 kg (93 lb) IBW/kg (Calculated) : 45.5 Heparin Dosing Weight: 42.2 kg  Vital Signs: Temp: 98.1 F (36.7 C) (09/25 1223) Temp Source: Oral (09/25 1223) BP: 110/80 (09/25 1500) Pulse Rate: 94 (09/25 1500)  Labs: Recent Labs    04/29/2020 1232 04/28/2020 1630  HGB 12.3  --   HCT 35.7*  --   PLT 372  --   APTT  --  34  LABPROT  --  13.8  INR  --  1.1  CREATININE 1.51*  --   TROPONINIHS 125* 630*    Estimated Creatinine Clearance: 17.8 mL/min (A) (by C-G formula based on SCr of 1.51 mg/dL (H)).   Medical History: Past Medical History:  Diagnosis Date  . Cancer ([place]) 2010   lung  . Cough 01/19/2016  . Hypertension   . Lung cancer ([place]) 01/19/2016    Medications:  No anticoagulation prior to admission per chart review  Assessment: Patient is an 86 y/o F with medical history including lung cancer, CHF, CKD who presented with weakness. There is concern for ACS given elevated troponins (125 >> 630) from baseline of 18 on 9/10. Furthermore patient with new-onset atrial fibrillation in the setting of pneumonia. Pharmacy has been consulted to initiate heparin infusion for both atrial fibrillation and ACS.   Baseline aPTT and PT-INR within normal limits. Baseline CBC within normal limits and c/w patient's baseline.   Goal of Therapy:  Heparin level 0.3-0.7 units/ml Monitor platelets by anticoagulation protocol: Yes   Plan:  --Heparin 2000 unit IV bolus x 1 followed by continuous infusion at 500 units/hr --Will check HL 8 hours after initiation of infusion --Daily CBC per protocol  Benita Gutter 05/08/2020,6:06 PM

## 2020-05-14 NOTE — Consult Note (Signed)
Stephanie Collins is a 86 y.o. female  [id number]  Primary Cardiologist: Neoma Laming Reason for Consultation: Chest pain  HPI: This is a 86 year old white female who has multiple medical problem presented to the hospital with shortness of breath and appears to have sepsis and UTI.  I was asked to evaluate the patient because of elevated troponin.  She denies any chest pain but has some shortness of breath on admission.   Review of Systems: No chest pain   Past Medical History:  Diagnosis Date  . Cancer ([place]) 2010   lung  . Cough 01/19/2016  . Hypertension   . Lung cancer ([place]) 01/19/2016    (Not in a hospital admission)    . acetylcysteine  3 mL Nebulization BID  . aspirin  324 mg Oral Once  . [START ON 05/15/2020] aspirin EC  81 mg Oral Daily  . atorvastatin  40 mg Oral Daily  . [START ON 05/15/2020] cholecalciferol  2,000 Units Oral Daily  . enoxaparin (LOVENOX) injection  40 mg Subcutaneous Q24H  . heparin  2,000 Units Intravenous Once  . ipratropium-albuterol  3 mL Nebulization Q4H    Infusions: . heparin      Allergies  Allergen Reactions  . Tramadol     confusion    Social History   Socioeconomic History  . Marital status: Widowed    Spouse name: Not on file  . Number of children: Not on file  . Years of education: Not on file  . Highest education level: Not on file  Occupational History  . Not on file  Tobacco Use  . Smoking status: Former Smoker  Substance and Sexual Activity  . Alcohol use: No    Alcohol/week: 0.0 standard drinks  . Drug use: No  . Sexual activity: Not on file  Other Topics Concern  . Not on file  Social History Narrative  . Not on file   Social Determinants of Health   Financial Resource Strain:   . Difficulty of Paying Living Expenses: Not on file  Food Insecurity:   . Worried About Charity fundraiser in the Last Year: Not on file  . Ran Out of Food in the Last Year: Not on file  Transportation Needs:   . Lack of  Transportation (Medical): Not on file  . Lack of Transportation (Non-Medical): Not on file  Physical Activity:   . Days of Exercise per Week: Not on file  . Minutes of Exercise per Session: Not on file  Stress:   . Feeling of Stress : Not on file  Social Connections:   . Frequency of Communication with Friends and Family: Not on file  . Frequency of Social Gatherings with Friends and Family: Not on file  . Attends Religious Services: Not on file  . Active Member of Clubs or Organizations: Not on file  . Attends Archivist Meetings: Not on file  . Marital Status: Not on file  Intimate Partner Violence:   . Fear of Current or Ex-Partner: Not on file  . Emotionally Abused: Not on file  . Physically Abused: Not on file  . Sexually Abused: Not on file    Family History  Problem Relation Age of Onset  . Breast cancer Neg Hx     PHYSICAL EXAM: Vitals:   05/11/2020 1430 04/30/2020 1500  BP: 106/70 110/80  Pulse: 86 94  Resp: (!) 24 (!) 25  Temp:    SpO2: 97% 99%  Intake/Output Summary (Last 24 hours) at 05/10/2020 2017 Last data filed at 05/15/2020 1607 Gross per 24 hour  Intake 700 ml  Output --  Net 700 ml    General:  Well appearing. No respiratory difficulty HEENT: normal Neck: supple. no JVD. Carotids 2+ bilat; no bruits. No lymphadenopathy or thryomegaly appreciated. Cor: PMI nondisplaced. Regular rate & rhythm. No rubs, gallops or murmurs. Lungs: clear Abdomen: soft, nontender, nondistended. No hepatosplenomegaly. No bruits or masses. Good bowel sounds. Extremities: no cyanosis, clubbing, rash, edema Neuro: alert & oriented x 3, cranial nerves grossly intact. moves all 4 extremities w/o difficulty. Affect pleasant.  ECG: Atrial fibrillation with poor R wave progression nonspecific ST-T changes  Results for orders placed or performed during the hospital encounter of 04/22/2020 (from the past 24 hour(s))  Urinalysis, Complete w Microscopic Urine, Clean Catch      Status: Abnormal   Collection Time: 05/01/2020 12:13 PM  Result Value Ref Range   Color, Urine YELLOW (A) YELLOW   APPearance CLOUDY (A) CLEAR   Specific Gravity, Urine 1.013 1.005 - 1.030   pH 8.0 5.0 - 8.0   Glucose, UA NEGATIVE NEGATIVE mg/dL   Hgb urine dipstick NEGATIVE NEGATIVE   Bilirubin Urine NEGATIVE NEGATIVE   Ketones, ur NEGATIVE NEGATIVE mg/dL   Protein, ur 30 (A) NEGATIVE mg/dL   Nitrite NEGATIVE NEGATIVE   Leukocytes,Ua LARGE (A) NEGATIVE   RBC / HPF 0-5 0 - 5 RBC/hpf   WBC, UA >50 (H) 0 - 5 WBC/hpf   Bacteria, UA RARE (A) NONE SEEN   Squamous Epithelial / LPF 0-5 0 - 5   Budding Yeast PRESENT    Triple Phosphate Crystal PRESENT   CBC     Status: Abnormal   Collection Time: 04/26/2020 12:32 PM  Result Value Ref Range   WBC 22.7 (H) 4.0 - 10.5 K/uL   RBC 4.49 3.87 - 5.11 MIL/uL   Hemoglobin 12.3 12.0 - 15.0 g/dL   HCT 35.7 (L) 36 - 46 %   MCV 79.5 (L) 80.0 - 100.0 fL   MCH 27.4 26.0 - 34.0 pg   MCHC 34.5 30.0 - 36.0 g/dL   RDW 15.6 (H) 11.5 - 15.5 %   Platelets 372 150 - 400 K/uL   nRBC 0.0 0.0 - 0.2 %  Comprehensive metabolic panel     Status: Abnormal   Collection Time: 04/21/2020 12:32 PM  Result Value Ref Range   Sodium 128 (L) 135 - 145 mmol/L   Potassium 4.8 3.5 - 5.1 mmol/L   Chloride 89 (L) 98 - 111 mmol/L   CO2 26 22 - 32 mmol/L   Glucose, Bld 101 (H) 70 - 99 mg/dL   BUN 58 (H) 8 - 23 mg/dL   Creatinine, Ser 1.51 (H) 0.44 - 1.00 mg/dL   Calcium 8.8 (L) 8.9 - 10.3 mg/dL   Total Protein 6.5 6.5 - 8.1 g/dL   Albumin 2.7 (L) 3.5 - 5.0 g/dL   AST 19 15 - 41 U/L   ALT 15 0 - 44 U/L   Alkaline Phosphatase 113 38 - 126 U/L   Total Bilirubin 0.9 0.3 - 1.2 mg/dL   GFR calc non Af Amer 31 (L) >60 mL/min   GFR calc Af Amer 36 (L) >60 mL/min   Anion gap 13 5 - 15  Troponin I (High Sensitivity)     Status: Abnormal   Collection Time: 05/16/2020 12:32 PM  Result Value Ref Range   Troponin I (High Sensitivity) 125 (HH) <18  ng/L  Brain natriuretic peptide      Status: Abnormal   Collection Time: 05/07/2020 12:32 PM  Result Value Ref Range   B Natriuretic Peptide 1,748.1 (H) 0.0 - 100.0 pg/mL  Lactic acid, plasma     Status: None   Collection Time: 04/23/2020  2:06 PM  Result Value Ref Range   Lactic Acid, Venous 1.3 0.5 - 1.9 mmol/L  Respiratory Panel by RT PCR (Flu A&B, Covid) - Nasopharyngeal Swab     Status: None   Collection Time: 04/28/2020  2:06 PM   Specimen: Nasopharyngeal Swab  Result Value Ref Range   SARS Coronavirus 2 by RT PCR NEGATIVE NEGATIVE   Influenza A by PCR NEGATIVE NEGATIVE   Influenza B by PCR NEGATIVE NEGATIVE  Procalcitonin     Status: None   Collection Time: 04/24/2020  4:30 PM  Result Value Ref Range   Procalcitonin 0.23 ng/mL  Protime-INR     Status: None   Collection Time: 05/01/2020  4:30 PM  Result Value Ref Range   Prothrombin Time 13.8 11.4 - 15.2 seconds   INR 1.1 0.8 - 1.2  APTT     Status: None   Collection Time: 04/30/2020  4:30 PM  Result Value Ref Range   aPTT 34 24 - 36 seconds  Troponin I (High Sensitivity)     Status: Abnormal   Collection Time: 04/27/2020  4:30 PM  Result Value Ref Range   Troponin I (High Sensitivity) 630 (HH) <18 ng/L   DG Chest Portable 1 View  Result Date: 04/20/2020 CLINICAL DATA:  Patient with weakness.  History of lung cancer. EXAM: PORTABLE CHEST 1 VIEW COMPARISON:  Chest radiograph April 29, 2020 FINDINGS: Monitoring leads overlie the patient. Stable cardiac and mediastinal contours. Bilateral pulmonary vascular redistribution. Mild interstitial opacities. Small bilateral pleural effusions. IMPRESSION: Increased bibasilar airspace opacities which may represent atelectasis or infection. Probable small bilateral pleural effusions. Bilateral interstitial opacities may represent edema. Electronically Signed   By: Lovey Newcomer M.D.   On: 04/24/2020 13:47     ASSESSMENT AND PLAN: Elevated troponin not due to non-STEMI but most likely demand ischemia.  Patient has sepsis and UTI.   Advise getting echocardiogram and will look at ejection fraction and make further recommendation.  Janelle Spellman A

## 2020-05-14 NOTE — Consult Note (Signed)
PHARMACY -  BRIEF ANTIBIOTIC NOTE   Pharmacy has received consult(s) for vancomycin from an ED provider.  The patient's profile has been reviewed for ht/wt/allergies/indication/available labs.    One time order(s) placed for vancomycin 1000 mg IV x 1 dose  Further antibiotics/pharmacy consults should be ordered by admitting physician if indicated.                       Thank you, Benn Moulder, PharmD Pharmacy Resident  05/19/2020 3:02 PM

## 2020-05-14 NOTE — ED Triage Notes (Signed)
Pt brought in via [facility]. EMS stated pt weak with cyanotic extremities, from toes to knees and fingers to elbows. Pt baseline O2 requirement is 2L/min. EMS increased O2 to 4L and cyanosis resolved. Pt currently on 2L. Per EMS, pt c/o feeling cold. T per EMS was 96.0. Has wound top R foot, 1cm by 1cm, from hitting foot on fridge about 1 week ago. No warmth or redness noted. Pt currently alert and oriented x 4. T 98.0 oral.

## 2020-05-14 NOTE — Consult Note (Signed)
CODE SEPSIS - PHARMACY COMMUNICATION  **Broad Spectrum Antibiotics should be administered within 1 hour of Sepsis diagnosis**  Time Code Sepsis Called/Page Received: 1349  Antibiotics Ordered: azithromycin 500 mg IV q24h and ceftriaxone 2g IV q24 per MD  Time of 1st antibiotic administration: 1418  Additional action taken by pharmacy: N/A  If necessary, Name of Provider/Nurse Contacted: N/A  Benn Moulder, PharmD Pharmacy Resident  05/13/2020 2:20 PM

## 2020-05-14 NOTE — ED Notes (Signed)
Admitting informed of pos MRSA face-to-face to B Randol Kern NP

## 2020-05-14 NOTE — ED Notes (Addendum)
Pt had a 15beat run of V--tach at this time. Pt is alert and asymptomatic. Dr. Soledad Gerlach notified, EDP Dilian Tamala Julian notified and verified visualization of V-tach

## 2020-05-14 NOTE — ED Notes (Signed)
Daughter states 99.4 lbs weight yesterday. This morning pt weighed 93 lbs. Had had diarrhea for about 1.5 days, about 10 times, watery.

## 2020-05-14 NOTE — Consult Note (Signed)
Pharmacy Antibiotic Note  Stephanie Collins is a 86 y.o. female with medical history including COPD, lung cancer, dCHF, CKD stage III admitted on 05/16/2020 with pneumonia.  Pharmacy has been consulted for cefepime and vancomycin dosing.  Plan: Cefepime 2 g q24h (renally adjusted)  Patient received vancomycin 1 g (24 mg/kg) IV x 1 in the ED   Will order maintenance regimen of vancomycin 500 mg q48h based on nomogram   Daily Scr per protocol. Pharmacy will adjust antibiotic dosing as indicated. Continue to follow-up cultures and narrow antibiotic therapy as appropriate.   Height: 5' (152.4 cm) Weight: 42.2 kg (93 lb) IBW/kg (Calculated) : 45.5  Temp (24hrs), Avg:98.1 F (36.7 C), Min:98.1 F (36.7 C), Max:98.1 F (36.7 C)  Recent Labs  Lab 05/19/2020 1232 05/03/2020 1406 05/16/2020 2029  WBC 22.7*  --   --   CREATININE 1.51*  --  1.38*  LATICACIDVEN  --  1.3  --     Estimated Creatinine Clearance: 19.5 mL/min (A) (by C-G formula based on SCr of 1.38 mg/dL (H)).    Allergies  Allergen Reactions  . Tramadol     confusion    Antimicrobials this admission: Ceftriaxone 9/25 x 1 in the ED  Cefepime 9/25 >>  Vancomycin 9/25 >>   Dose adjustments this admission: n/a  Microbiology results: 9/25 BCx: pending 9/25 UCx: pending  9/25 Sputum: pending  9/25 MRSA PCR: pending 9/25 Respiratory panel (Flu A&B, Covid): negative  Thank you for allowing pharmacy to be a part of this patient's care.  Stephanie Collins 04/27/2020 9:59 PM

## 2020-05-14 NOTE — ED Notes (Signed)
Pt given more warm blankets. Explained that daughter is on her way. Pt resting in bed.

## 2020-05-14 NOTE — ED Provider Notes (Signed)
[facility] Emergency Department Provider Note   ____________________________________________    I have reviewed the triage vital signs and the nursing notes.   HISTORY  Chief Complaint Weakness   Patient's history is limited as patient is a poor historian  HPI Stephanie Collins is a 86 y.o. female with a history of CHF, CKD, COPD, on 2 L nasal cannula typically who presents with increased weakness, shortness of breath, reportedly some confusion as well.  No reports of fevers or chills.  Patient reports currently she feels "okay ".  Denies chest pain.  No abdominal pain.  Occasional cough.  Denies dysuria.  Past Medical History:  Diagnosis Date  . Cancer ([place]) 2010   lung  . Cough 01/19/2016  . Hypertension   . Lung cancer ([place]) 01/19/2016    Patient Active Problem List   Diagnosis Date Noted  . HCAP (healthcare-associated pneumonia) 05/03/2020  . Sepsis ([place]) 05/03/2020  . Protein-calorie malnutrition, severe 05/02/2020  . CHF (congestive heart failure) ([place]) 04/30/2020  . Acute exacerbation of CHF (congestive heart failure) ([place]) 04/29/2020  . Hyponatremia 04/29/2020  . CKD (chronic kidney disease), stage III 04/29/2020  . Acute on chronic respiratory failure with hypoxia ([place]) 03/25/2020  . Acute CHF (congestive heart failure) ([place]) 03/22/2020  . Leg edema   . Cough 01/19/2016  . Malignant neoplasm of lower lobe, right bronchus or lung (CODE) 01/19/2016  . Chronic obstructive pulmonary disease ([place]) 07/28/2015  . H/O malignant neoplasm 07/28/2015  . BP (high blood pressure) 07/28/2015  . Arthritis, degenerative 07/28/2015  . OP (osteoporosis) 07/28/2015  . Abnormal loss of weight 04/26/2015  . 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 BREAST BIOPSY Left 2011   benign  . KYPHOPLASTY N/A 02/01/2015   Procedure: KYPHOPLASTY;  Surgeon: Hessie Knows, MD;  Location: [facility] ORS;  Service: Orthopedics;   Laterality: N/A;  T10    Prior to Admission medications   Medication Sig Start Date End Date Taking? Authorizing Provider  acetaminophen (TYLENOL) 325 MG tablet Take 650 mg by mouth as needed for mild pain.   Yes [provider]  acetylcysteine (MUCOMYST) 20 % nebulizer solution SMARTSIG:4 Milliliter(s) Via Inhaler Twice Daily 05/09/20  Yes [provider]  albuterol (VENTOLIN HFA) 108 (90 Base) MCG/ACT inhaler Inhale 2 puffs into the lungs every 4 (four) hours as needed for wheezing or shortness of breath. 01/04/20  Yes [provider]  calcium carbonate (TUMS EX) 750 MG chewable tablet Chew 1 tablet by mouth as needed for heartburn.   Yes [provider]  carvedilol (COREG) 3.125 MG tablet Take 1 tablet (3.125 mg total) by mouth 2 (two) times daily with a meal. 03/25/20  Yes Jennye Boroughs, MD  diphenhydrAMINE (BENADRYL) 25 mg capsule Take 25 mg by mouth at bedtime as needed for itching.   Yes [provider]  furosemide (LASIX) 40 MG tablet Take 40 mg by mouth daily.   Yes [provider]  ipratropium-albuterol (DUONEB) 0.5-2.5 (3) MG/3ML SOLN Take 3 mLs by nebulization 4 (four) times daily. 05/06/20  Yes [provider]  lisinopril (ZESTRIL) 2.5 MG tablet Take 1 tablet (2.5 mg total) by mouth daily. 03/26/20  Yes Jennye Boroughs, MD  potassium chloride (KLOR-CON) 10 MEQ tablet Take 1 tablet (10 mEq total) by mouth daily. While taking lasix 05/02/20 06/01/20 Yes Wyvonnia Dusky, MD  amLODipine (NORVASC) 5 MG tablet Take 5 mg by mouth daily. 04/30/20  [provider]  cholecalciferol (VITAMIN D3) 25 MCG (1000 UNIT) tablet Take 2,000 Units by mouth daily.    [provider]     Allergies Tramadol  Family History  Problem Relation Age of Onset  . Breast cancer Neg Hx     Social History Social History   Tobacco Use  . Smoking status: Former Smoker  Substance Use Topics  . Alcohol use: No    Alcohol/week: 0.0  standard drinks  . Drug use: No    Review of Systems  Constitutional: No reports of fever Eyes: No visual changes.  ENT: No throat swelling Cardiovascular: Denies chest pain. Respiratory: Shortness of breath as above Gastrointestinal: No abdominal pain.  No nausea, no vomiting.   Genitourinary: Negative for dysuria. Musculoskeletal: Negative for back pain. Skin: Negative for rash. Neurological: Negative for headaches    ____________________________________________   PHYSICAL EXAM:  VITAL SIGNS: ED Triage Vitals  Enc Vitals Group     BP 04/29/2020 1200 115/75     Pulse Rate 04/29/2020 1200 94     Resp 04/22/2020 1200 20     Temp 05/19/2020 1223 98.1 F (36.7 C)     Temp Source 04/30/2020 1223 Oral     SpO2 04/27/2020 1200 100 %     Weight 05/15/2020 1259 43.5 kg (96 lb)     Height 04/24/2020 1259 1.524 m (5')     Head Circumference --      Peak Flow --      Pain Score 04/27/2020 1205 0     Pain Loc --      Pain Edu? --      Excl. in [place]? --     Constitutional: Alert, oriented to place and person Eyes: Conjunctivae are normal.  Head: Atraumatic. Nose: No congestion/rhinnorhea. Mouth/Throat: Mucous membranes are moist.    Cardiovascular: Normal rate, regular rhythm. Grossly normal heart sounds.  Good peripheral circulation. Respiratory: Increased respiratory effort with tachypnea.  No retractions.  Bibasilar Rales Gastrointestinal: Soft and nontender. No distention.    Musculoskeletal: No lower extremity tenderness nor edema.  1+ DPs bilaterally, small shallow ulceration to the top of the right foot, forefoot Neurologic:  Normal speech and language. No gross focal neurologic deficits are appreciated.  Skin:  Skin is warm, dry  Psychiatric: Mood and affect are normal. Speech and behavior are normal.  ____________________________________________   LABS (all labs ordered are listed, but only abnormal results are displayed)  Labs Reviewed  CBC - Abnormal; Notable for the  following components:      Result Value   WBC 22.7 (*)    HCT 35.7 (*)    MCV 79.5 (*)    RDW 15.6 (*)    All other components within normal limits  URINALYSIS, COMPLETE (UACMP) WITH MICROSCOPIC - Abnormal; Notable for the following components:   Color, Urine YELLOW (*)    APPearance CLOUDY (*)    Protein, ur 30 (*)    Leukocytes,Ua LARGE (*)    WBC, UA >50 (*)    Bacteria, UA RARE (*)    All other components within normal limits  COMPREHENSIVE METABOLIC PANEL - Abnormal; Notable for the following components:   Sodium 128 (*)    Chloride 89 (*)    Glucose, Bld 101 (*)    BUN 58 (*)    Creatinine, Ser 1.51 (*)    Calcium 8.8 (*)    Albumin 2.7 (*)    GFR calc non Af Amer 31 (*)    GFR  calc Af Amer 36 (*)    All other components within normal limits  TROPONIN I (HIGH SENSITIVITY) - Abnormal; Notable for the following components:   Troponin I (High Sensitivity) 125 (*)    All other components within normal limits  CULTURE, BLOOD (ROUTINE X 2)  CULTURE, BLOOD (ROUTINE X 2)  RESPIRATORY PANEL BY RT PCR (FLU A&B, COVID)  LACTIC ACID, PLASMA  LACTIC ACID, PLASMA  BRAIN NATRIURETIC PEPTIDE  CBG MONITORING, ED   ____________________________________________  EKG  ED ECG REPORT I, Lavonia Drafts, the attending physician, personally viewed and interpreted this ECG.  Date: 04/25/2020  Rhythm: Atrial fibrillation QRS Axis: normal Intervals: Abnormal ST/T Wave abnormalities: normal Narrative Interpretation: no evidence of acute ischemia  ____________________________________________  RADIOLOGY  Chest x-ray reviewed by me, concerning for pneumonia ____________________________________________   PROCEDURES  Procedure(s) performed: No  .1-3 Lead EKG Interpretation Performed by: Lavonia Drafts, MD Authorized by: Lavonia Drafts, MD     Interpretation: normal     ECG rate assessment: normal     Rhythm: atrial fibrillation     Ectopy: none     Conduction: normal        Critical Care performed: yes  CRITICAL CARE Performed by: Lavonia Drafts   Total critical care time: 30  minutes  Critical care time was exclusive of separately billable procedures and treating other patients.  Critical care was necessary to treat or prevent imminent or life-threatening deterioration.  Critical care was time spent personally by me on the following activities: development of treatment plan with patient and/or surrogate as well as nursing, discussions with consultants, evaluation of patient's response to treatment, examination of patient, obtaining history from patient or surrogate, ordering and performing treatments and interventions, ordering and review of laboratory studies, ordering and review of radiographic studies, pulse oximetry and re-evaluation of patient's condition.  ____________________________________________   INITIAL IMPRESSION / ASSESSMENT AND PLAN / ED COURSE  Pertinent labs & imaging results that were available during my care of the patient were reviewed by me and considered in my medical decision making (see chart for details).  Patient presents with weakness, fatigue, reports of shortness of breath.  She is mildly tachypneic in the emergency department, scattered rales on exam.  Suspicious for pneumonia, sepsis, Covid, urinary tract infection  Will obtain labs, chest x-ray, Covid swab and place in the cardiac monitor.  Will give IV fluids  Lab work is notable for elevated white blood cell count of 22,000, elevated troponin of 125, elevated BUN to creatinine ratio 58:1.5  Concerning for sepsis, will add lactic acid and blood cultures, given chest x-ray results will cover with Rocephin and azithromycin.  Urinalysis also demonstrates significant white blood cells.  ----------------------------------------- 2:42 PM on 04/22/2020 -----------------------------------------  Discussed with hospitalist, he will admit, recommended HCAP  antibiotics given recent hospitalization      ____________________________________________   FINAL CLINICAL IMPRESSION(S) / ED DIAGNOSES  Final diagnoses:  HCAP (healthcare-associated pneumonia)  Sepsis, due to unspecified organism, unspecified whether acute organ dysfunction present [facility])        Note:  This document was prepared using Dragon voice recognition software and may include unintentional dictation errors.   Lavonia Drafts, MD 05/11/2020 [phone]

## 2020-05-14 NOTE — ED Notes (Signed)
Pt forgot why was in ED. Has been calling out from room. Pt reoriented to place/situation.

## 2020-05-14 NOTE — H&P (Addendum)
History and Physical    Stephanie Collins:[id number] DOB: 14-Apr-1932 DOA: 04/25/2020  Referring MD/NP/PA:   PCP: Baxter Hire, MD   Patient coming from:  The patient is coming from home.  At baseline, pt is independent for most of ADL.        Chief Complaint: Shortness of breath, diarrhea, discoloration of bilateral feet, increased urinary frequency, confusion, generalized weakness  HPI: Stephanie Collins is a 86 y.o. female with medical history significant of hypertension, COPD on 2 L oxygen, lung cancer, dCHF, CKD stage III, hyponatremia, who presents with shortness of breath, diarrhea, discoloration of bilateral feet, increased urinary frequency, confusion, generalized weakness.  Per patient's daughter, patient normally is alert, oriented x3.  In the past 2 days, patient has been confused.  She is not oriented to time, but is still oriented to place and person.  She has shortness of breath which has been progressively worsening.  Denies chest pain, fever or chills.  She has lost 6 pounds recently.  Patient has had at least 8 times of watery diarrhea since yesterday.  No nausea, vomiting or abdominal pain.  Patient has increased urinary frequency, but no dysuria or burning on urination.  Patient moves all extremities.  No facial droop or slurred speech.  Per her daughter, patient had an episode of dark purplish discoloration of bilateral feet.  Left foot is cool. Her foot is mildly painful bilaterally.  ED Course: pt was found to have troponin 125 -->630, WBC 22.7, positive urinalysis (cloudy appearance, large amount of leukocyte, rare bacteria, WBC> 50), negative Covid PCR, lactic acid 1.3, sodium 128, worsening renal function, temperature normal, soft blood pressure, tachycardia with heart rate 96, tachypnea with RR 27, oxygen saturation 90% on room air, which improved to 96% on 2 L oxygen.  Chest x-ray showed bilateral basilar opacity.  Patient is admitted to [place] bed as inpatient.  Review of  Systems: Could not reviewed accurately due to altered mental status.  Allergy:  Allergies  Allergen Reactions  . Tramadol     confusion    Past Medical History:  Diagnosis Date  . Cancer ([place]) 2010   lung  . Cough 01/19/2016  . Hypertension   . Lung cancer ([place]) 01/19/2016    Past Surgical History:  Procedure Laterality Date  . BREAST BIOPSY Left 2011   benign  . KYPHOPLASTY N/A 02/01/2015   Procedure: KYPHOPLASTY;  Surgeon: Hessie Knows, MD;  Location: [facility] ORS;  Service: Orthopedics;  Laterality: N/A;  T10    Social History:  reports that she has quit smoking. She does not have any smokeless tobacco history on file. She reports that she does not drink alcohol and does not use drugs.  Family History:  Family History  Problem Relation Age of Onset  . Breast cancer Neg Hx      Prior to Admission medications   Medication Sig Start Date End Date Taking? Authorizing Provider  acetaminophen (TYLENOL) 325 MG tablet Take 650 mg by mouth as needed for mild pain.   Yes [provider]  acetylcysteine (MUCOMYST) 20 % nebulizer solution SMARTSIG:4 Milliliter(s) Via Inhaler Twice Daily 05/09/20  Yes [provider]  albuterol (VENTOLIN HFA) 108 (90 Base) MCG/ACT inhaler Inhale 2 puffs into the lungs every 4 (four) hours as needed for wheezing or shortness of breath. 01/04/20  Yes [provider]  calcium carbonate (TUMS EX) 750 MG chewable tablet Chew 1 tablet by mouth as needed for heartburn.   Yes [provider]  carvedilol (COREG) 3.125 MG tablet Take 1 tablet (3.125 mg total) by mouth 2 (two) times daily with a meal. 03/25/20  Yes Jennye Boroughs, MD  diphenhydrAMINE (BENADRYL) 25 mg capsule Take 25 mg by mouth at bedtime as needed for itching.   Yes [provider]  furosemide (LASIX) 40 MG tablet Take 40 mg by mouth daily.   Yes [provider]  ipratropium-albuterol (DUONEB) 0.5-2.5 (3) MG/3ML SOLN Take 3 mLs by nebulization 4 (four)  times daily. 05/06/20  Yes [provider]  lisinopril (ZESTRIL) 2.5 MG tablet Take 1 tablet (2.5 mg total) by mouth daily. 03/26/20  Yes Jennye Boroughs, MD  potassium chloride (KLOR-CON) 10 MEQ tablet Take 1 tablet (10 mEq total) by mouth daily. While taking lasix 05/02/20 06/01/20 Yes Wyvonnia Dusky, MD  amLODipine (NORVASC) 5 MG tablet Take 5 mg by mouth daily. 04/30/20   [provider]  cholecalciferol (VITAMIN D3) 25 MCG (1000 UNIT) tablet Take 2,000 Units by mouth daily.    [provider]    Physical Exam: Vitals:   04/25/2020 1330 05/15/2020 1430 04/21/2020 1500 04/24/2020 1523  BP: 101/69 106/70 110/80   Pulse: 96 86 94   Resp: (!) 27 (!) 24 (!) 25   Temp:      TempSrc:      SpO2: 96% 97% 99%   Weight:    42.2 kg  Height:    5' (1.524 m)   General: Not in acute distress HEENT:       Eyes: PERRL, EOMI, no scleral icterus.       ENT: No discharge from the ears and nose.       Neck: No JVD, no bruit, no mass felt. Heme: No neck lymph node enlargement. Cardiac: S1/S2, RRR, No murmurs, No gallops or rubs. Respiratory: Has crackles bilaterally. GI: Soft, nondistended, nontender, no organomegaly, BS present. GU: No hematuria Ext: has trace leg edema bilaterally. 1+DP/PT pulse on the right leg, but DP/PT pulse is not palpable on the left foot. The left foot is cool and slightly purplish in color. Musculoskeletal: No joint deformities, No joint redness or warmth, no limitation of ROM in spin. Skin: No rashes.  Neuro: confused, orientated to place and person, not to time, cranial nerves II-XII grossly intact, moves all extremities. Psych: Patient is not psychotic, no suicidal or hemocidal ideation.  Labs on Admission: I have personally reviewed following labs and imaging studies  CBC: Recent Labs  Lab 05/02/2020 1232  WBC 22.7*  HGB 12.3  HCT 35.7*  MCV 79.5*  PLT 010   Basic Metabolic Panel: Recent Labs  Lab 05/19/2020 1232  NA 128*  K 4.8  CL 89*   CO2 26  GLUCOSE 101*  BUN 58*  CREATININE 1.51*  CALCIUM 8.8*   GFR: Estimated Creatinine Clearance: 17.8 mL/min (A) (by C-G formula based on SCr of 1.51 mg/dL (H)). Liver Function Tests: Recent Labs  Lab 05/10/2020 1232  AST 19  ALT 15  ALKPHOS 113  BILITOT 0.9  PROT 6.5  ALBUMIN 2.7*   No results for input(s): LIPASE, AMYLASE in the last 168 hours. No results for input(s): AMMONIA in the last 168 hours. Coagulation Profile: Recent Labs  Lab 04/24/2020 1630  INR 1.1   Cardiac Enzymes: No results for input(s): CKTOTAL, CKMB, CKMBINDEX, TROPONINI in the last 168 hours. BNP (last 3 results) No results for input(s): PROBNP in the last 8760 hours. HbA1C: No results for input(s): HGBA1C in the last 72 hours. CBG:  No results for input(s): GLUCAP in the last 168 hours. Lipid Profile: No results for input(s): CHOL, HDL, LDLCALC, TRIG, CHOLHDL, LDLDIRECT in the last 72 hours. Thyroid Function Tests: No results for input(s): TSH, T4TOTAL, FREET4, T3FREE, THYROIDAB in the last 72 hours. Anemia Panel: No results for input(s): VITAMINB12, FOLATE, FERRITIN, TIBC, IRON, RETICCTPCT in the last 72 hours. Urine analysis:    Component Value Date/Time   COLORURINE YELLOW (A) 04/25/2020 1213   APPEARANCEUR CLOUDY (A) 05/13/2020 1213   LABSPEC 1.013 05/09/2020 1213   PHURINE 8.0 04/24/2020 1213   GLUCOSEU NEGATIVE 05/02/2020 1213   HGBUR NEGATIVE 05/12/2020 1213   [place] 05/09/2020 1213   KETONESUR NEGATIVE 05/10/2020 1213   PROTEINUR 30 (A) 05/15/2020 1213   NITRITE NEGATIVE 04/24/2020 1213   LEUKOCYTESUR LARGE (A) 05/19/2020 1213   Sepsis Labs: @LABRCNTIP (procalcitonin:4,lacticidven:4) ) Recent Results (from the past 240 hour(s))  Respiratory Panel by RT PCR (Flu A&B, Covid) - Nasopharyngeal Swab     Status: None   Collection Time: 05/02/2020  2:06 PM   Specimen: Nasopharyngeal Swab  Result Value Ref Range Status   SARS Coronavirus 2 by RT PCR NEGATIVE NEGATIVE  Final    Comment: (NOTE) SARS-CoV-2 target nucleic acids are NOT DETECTED.  The SARS-CoV-2 RNA is generally detectable in upper respiratoy specimens during the acute phase of infection. The lowest concentration of SARS-CoV-2 viral copies this assay can detect is 131 copies/mL. A negative result does not preclude SARS-Cov-2 infection and should not be used as the sole basis for treatment or other patient management decisions. A negative result may occur with  improper specimen collection/handling, submission of specimen other than nasopharyngeal swab, presence of viral mutation(s) within the areas targeted by this assay, and inadequate number of viral copies (<131 copies/mL). A negative result must be combined with clinical observations, patient history, and epidemiological information. The expected result is Negative.  Fact Sheet for Patients:  PinkCheek.be  Fact Sheet for Healthcare Providers:  GravelBags.it  This test is no t yet approved or cleared by the [place] FDA and  has been authorized for detection and/or diagnosis of SARS-CoV-2 by FDA under an Emergency Use Authorization (EUA). This EUA will remain  in effect (meaning this test can be used) for the duration of the COVID-19 declaration under Section 564(b)(1) of the Act, 21 U.S.C. section 360bbb-3(b)(1), unless the authorization is terminated or revoked sooner.     Influenza A by PCR NEGATIVE NEGATIVE Final   Influenza B by PCR NEGATIVE NEGATIVE Final    Comment: (NOTE) The Xpert Xpress SARS-CoV-2/FLU/RSV assay is intended as an aid in  the diagnosis of influenza from Nasopharyngeal swab specimens and  should not be used as a sole basis for treatment. Nasal washings and  aspirates are unacceptable for Xpert Xpress SARS-CoV-2/FLU/RSV  testing.  Fact Sheet for Patients: PinkCheek.be  Fact Sheet for Healthcare  Providers: GravelBags.it  This test is not yet approved or cleared by the [place] FDA and  has been authorized for detection and/or diagnosis of SARS-CoV-2 by  FDA under an Emergency Use Authorization (EUA). This EUA will remain  in effect (meaning this test can be used) for the duration of the  Covid-19 declaration under Section 564(b)(1) of the Act, 21  U.S.C. section 360bbb-3(b)(1), unless the authorization is  terminated or revoked. Performed at [facility], [address], [place], [place] [address]      Radiological Exams on Admission: DG Chest Portable 1 View  Result Date: 05/11/2020 CLINICAL DATA:  Patient with weakness.  History of lung cancer. EXAM: PORTABLE CHEST 1 VIEW COMPARISON:  Chest radiograph April 29, 2020 FINDINGS: Monitoring leads overlie the patient. Stable cardiac and mediastinal contours. Bilateral pulmonary vascular redistribution. Mild interstitial opacities. Small bilateral pleural effusions. IMPRESSION: Increased bibasilar airspace opacities which may represent atelectasis or infection. Probable small bilateral pleural effusions. Bilateral interstitial opacities may represent edema. Electronically Signed   By: Lovey Newcomer M.D.   On: 04/21/2020 13:47     EKG: Independently reviewed.  Atrial fibrillation, QTc 413, T wave inversion in V3-V4  Assessment/Plan Principal Problem:   HCAP (healthcare-associated pneumonia) Active Problems:   Chronic obstructive pulmonary disease (HCC)   Hyponatremia   Sepsis (HCC)   Chronic diastolic CHF (congestive heart failure) (HCC)   Acute renal failure superimposed on stage 3a chronic kidney disease (HCC)   UTI (urinary tract infection)   Elevated troponin   New onset atrial fibrillation (HCC)   Acute metabolic encephalopathy   HTN (hypertension)   Sepsis due to UTI and possible HCAP (healthcare-associated pneumonia): Patient meets criteria for sepsis with leukocytosis,  tachycardia with heart rate 96, tachypnea with RR 27.  Lactic acid normal.  Blood pressure soft, but hemodynamically stable.  - Will admit to med-surg bed as inpt - IV Vancomycin and cefepime, - Mucinex for cough  - Bronchodilators - Urine legionella and S. pneumococcal antigen - Follow up blood culture x2, sputum culture -Follow-up urine culture - will get Procalcitonin and trend lactic acid level per sepsis protocol - IVF: 500 of NS bolus in ED (patient has a diastolic congestive heart failure, limiting aggressive IV fluids treatment)  Chronic obstructive pulmonary disease (HCC) -Bronchodilators  Hypertension: -Hold lisinopril, Coreg, Lasix due to worsening renal function and softer blood pressure  Hyponatremia: Most likely due to poor oral intake and dehydration, and continuation of her Lasix and lisinopril - Will check urine sodium, urine osmolality, serum osmolality. - check TSH - Fluid restriction - IVF: Received 500 cc normal saline - Hold Lasix and lisinopril - f/u by BMP q8h - avoid over correction too fast due to risk of central pontine myelinolysis  Chronic diastolic CHF (congestive heart failure) ([place]): 2D echo 04/30/2020 showed EF of 55 to 60% with grade 2 diastolic dysfunction.  Patient had lost 60 pounds recently.  Does not seem to have CHF exacerbation today -Hold Lasix due to worsening renal function and softer blood pressure  Acute renal failure superimposed on stage 3a chronic kidney disease ([place]) -Patient received 500 cc normal saline -Follow-up by BMP -Hold Lasix and lisinopril  Elevated troponin and New onset atrial fibrillation ([place]): Trop 125 -->630.  Dr. Humphrey Rolls of card is consulted. -start IV heparin -Aspirin -Start Lipitor -Check A1c, FLP -Repeat EKG in morning  Acute metabolic encephalopathy: Likely due to multifactorial etiology, including sepsis, UTI, hypoxia.  Patient also has elevated troponin, new onset A. fib, worsening renal  function. -Frequent neuro check  Discoloration of bilateral feet: need to r/o ischemic limb. -consulted Dr. Trula Slade of [facility]    DVT ppx: on IV Heparin     Code Status: DNR per patient and her daughter Family Communication:   Yes, patient's daughter  at bed side Disposition Plan:  Anticipate discharge back to previous environment Consults called:  Dr. Humphrey Rolls of card and Dr. Trula Slade of [facility] Admission status: Med-surg bed as inpt     Status is: Inpatient  Remains inpatient appropriate because:Inpatient level of care appropriate due to severity of illness   Dispo: The patient is from:  Home              Anticipated d/c is to: Home              Anticipated d/c date is: 2 days              Patient currently is not medically stable to d/c.           Date of Service 05/06/2020    Ivor Costa [facility]   If 7PM-7AM, please contact night-coverage [email or web address] 05/01/2020, 7:50 PM

## 2020-05-14 NOTE — Consult Note (Signed)
[facility] [place]  Patient name: Stephanie Collins MRN: [id number] DOB: 1932-06-20 Sex: female   REQUESTING PROVIDER:    Dr. Blaine Hamper   REASON FOR CONSULT:    eval for ischemic leg  HISTORY OF PRESENT ILLNESS:   Stephanie Collins is a 86 y.o. female, who presented to the ER with shortness of breath, diarrhea, urinary frequency, confusion, generalized weakness, and discoloration of both feet.  She was diagnosed with sepsis and a UTI.  Pedal pulses were not palpable and so vascular consultation was obtained.  The patient denies pain in her feet.  She has no trouble moving or feeling her feet.  SHe has a wound on her right foot from an injury with her cain.  Her troponins were elevated as was her WBC.  She has a history of lung cancer and is on 2L of O2.  She has stage III renal disease.    PAST MEDICAL HISTORY    Past Medical History:  Diagnosis Date  . Cancer ([place]) 2010   lung  . Cough 01/19/2016  . Hypertension   . Lung cancer ([place]) 01/19/2016     FAMILY HISTORY   Family History  Problem Relation Age of Onset  . Breast cancer Neg Hx     SOCIAL HISTORY:   Social History   Socioeconomic History  . Marital status: Widowed    Spouse name: Not on file  . Number of children: Not on file  . Years of education: Not on file  . Highest education level: Not on file  Occupational History  . Not on file  Tobacco Use  . Smoking status: Former Smoker  Substance and Sexual Activity  . Alcohol use: No    Alcohol/week: 0.0 standard drinks  . Drug use: No  . Sexual activity: Not on file  Other Topics Concern  . Not on file  Social History Narrative  . Not on file   Social Determinants of Health   Financial Resource Strain:   . Difficulty of Paying Living Expenses: Not on file  Food Insecurity:   . Worried About Charity fundraiser in the Last Year: Not on file  . Ran Out of Food in the Last Year: Not on file  Transportation  Needs:   . Lack of Transportation (Medical): Not on file  . Lack of Transportation (Non-Medical): Not on file  Physical Activity:   . Days of Exercise per Week: Not on file  . Minutes of Exercise per Session: Not on file  Stress:   . Feeling of Stress : Not on file  Social Connections:   . Frequency of Communication with Friends and Family: Not on file  . Frequency of Social Gatherings with Friends and Family: Not on file  . Attends Religious Services: Not on file  . Active Member of Clubs or Organizations: Not on file  . Attends Archivist Meetings: Not on file  . Marital Status: Not on file  Intimate Partner Violence:   . Fear of Current or Ex-Partner: Not on file  . Emotionally Abused: Not on file  . Physically Abused: Not on file  . Sexually Abused: Not on file    ALLERGIES:    Allergies  Allergen Reactions  . Tramadol     confusion    CURRENT MEDICATIONS:    Current Facility-Administered Medications  Medication Dose Route Frequency Provider Last Rate Last Admin  . acetaminophen (TYLENOL) tablet 650 mg  650 mg Oral Q6H PRN  Ivor Costa, MD      . acetylcysteine (MUCOMYST) 20 % nebulizer / oral solution 3 mL  3 mL Nebulization BID Ivor Costa, MD      . albuterol (PROVENTIL) (2.5 MG/3ML) 0.083% nebulizer solution 2.5 mg  2.5 mg Nebulization Q4H PRN Ivor Costa, MD      . Derrill Memo ON 05/15/2020] aspirin EC tablet 81 mg  81 mg Oral Daily Ivor Costa, MD      . atorvastatin (LIPITOR) tablet 40 mg  40 mg Oral Daily Ivor Costa, MD      . calcium carbonate (TUMS EX) chewable tablet 750 mg  1 tablet Oral PRN Ivor Costa, MD      . Derrill Memo ON 05/15/2020] cholecalciferol (VITAMIN D3) tablet 2,000 Units  2,000 Units Oral Daily Ivor Costa, MD      . dextromethorphan-guaiFENesin [facility] DM) 30-600 MG per 12 hr tablet 1 tablet  1 tablet Oral BID PRN Ivor Costa, MD      . diphenhydrAMINE (BENADRYL) capsule 25 mg  25 mg Oral QHS PRN Ivor Costa, MD      . enoxaparin (LOVENOX)  injection 40 mg  40 mg Subcutaneous Q24H Ivor Costa, MD      . heparin ADULT infusion 100 units/mL (25000 units/227mL sodium chloride 0.45%)  500 Units/hr Intravenous Continuous Benita Gutter, RPH 5 mL/hr at 05/11/2020 2047 500 Units/hr at 05/18/2020 2047  . ipratropium-albuterol (DUONEB) 0.5-2.5 (3) MG/3ML nebulizer solution 3 mL  3 mL Nebulization Q4H Ivor Costa, MD   3 mL at 04/22/2020 2048  . ondansetron (ZOFRAN) injection 4 mg  4 mg Intravenous Q8H PRN Ivor Costa, MD       Current Outpatient Medications  Medication Sig Dispense Refill  . acetaminophen (TYLENOL) 325 MG tablet Take 650 mg by mouth as needed for mild pain.    Marland Kitchen acetylcysteine (MUCOMYST) 20 % nebulizer solution SMARTSIG:4 Milliliter(s) Via Inhaler Twice Daily    . albuterol (VENTOLIN HFA) 108 (90 Base) MCG/ACT inhaler Inhale 2 puffs into the lungs every 4 (four) hours as needed for wheezing or shortness of breath.    . calcium carbonate (TUMS EX) 750 MG chewable tablet Chew 1 tablet by mouth as needed for heartburn.    . carvedilol (COREG) 3.125 MG tablet Take 1 tablet (3.125 mg total) by mouth 2 (two) times daily with a meal. 60 tablet 0  . cholecalciferol (VITAMIN D3) 25 MCG (1000 UNIT) tablet Take 2,000 Units by mouth daily.    . diphenhydrAMINE (BENADRYL) 25 mg capsule Take 25 mg by mouth at bedtime as needed for itching.    . furosemide (LASIX) 40 MG tablet Take 20 mg by mouth daily.     Marland Kitchen ipratropium-albuterol (DUONEB) 0.5-2.5 (3) MG/3ML SOLN Take 3 mLs by nebulization 4 (four) times daily.    Marland Kitchen lisinopril (ZESTRIL) 2.5 MG tablet Take 1 tablet (2.5 mg total) by mouth daily. 30 tablet 0    REVIEW OF SYSTEMS:   [X]  denotes positive finding, [ ]  denotes negative finding Cardiac  Comments:  Chest pain or chest pressure:    Shortness of breath upon exertion:    Short of breath when lying flat: x   Irregular heart rhythm:        Vascular    Pain in calf, thigh, or hip brought on by ambulation:    Pain in feet at night  that wakes you up from your sleep:     Blood clot in your veins:    Leg swelling:  Pulmonary    Oxygen at home:    Productive cough:     Wheezing:         Neurologic    Sudden weakness in arms or legs:     Sudden numbness in arms or legs:     Sudden onset of difficulty speaking or slurred speech:    Temporary loss of vision in one eye:     Problems with dizziness:         Gastrointestinal    Blood in stool:      Vomited blood:         Genitourinary    Burning when urinating:     Blood in urine:        Psychiatric    Major depression:         Hematologic    Bleeding problems:    Problems with blood clotting too easily:        Skin    Rashes or ulcers:        Constitutional    Fever or chills:     PHYSICAL EXAM:   Vitals:   05/01/2020 1430 05/07/2020 1500 05/02/2020 1523 04/27/2020 2039  BP: 106/70 110/80  92/60  Pulse: 86 94  92  Resp: (!) 24 (!) 25  (!) 26  Temp:      TempSrc:      SpO2: 97% 99%  95%  Weight:   42.2 kg   Height:   5' (1.524 m)     GENERAL: The patient is a well-nourished female, in no acute distress. The vital signs are documented above. CARDIAC: There is a regular rate and rhythm.  VASCULAR: Biphasic bilateral DP and PT doppler signals PULMONARY: Nonlabored respirations ABDOMEN: Soft and non-tender with normal pitched bowel sounds.  MUSCULOSKELETAL: There are no major deformities or cyanosis. NEUROLOGIC: normal motor and sensory function of both feet SKIN: superficial ulceration of the right foot PSYCHIATRIC: The patient has a normal affect.  STUDIES:   none  ASSESSMENT and PLAN   The patient has normal motor and sensory of both feet.  Both feet are warm.  She has biphasic bilateral doppler signals.  There is no concern for an ischemic leg.  Her right foot wound should be monitored. She says this is from a recent injury from her cain. If it deteriorates, ABI's should be obtained and if abnormal, angiography could be  performed.   Leia Alf, MD, FACS [facility] [phone] Pager [phone]

## 2020-05-14 NOTE — ED Notes (Signed)
Daughter states that pt had been taking 40mg  furosemide daily, Dr switched her to 20mg  about 2d ago. Pt takes Duoneb twice daily for COPD. Will discuss with provider.

## 2020-05-14 NOTE — ED Notes (Signed)
Patient set up for dining. Family member at bedside is assisting patient.

## 2020-05-14 NOTE — ED Notes (Signed)
Daughter at bedside.

## 2020-05-15 ENCOUNTER — Encounter: Payer: Self-pay | Admitting: Internal Medicine

## 2020-05-15 DIAGNOSIS — E871 Hypo-osmolality and hyponatremia: Secondary | ICD-10-CM | POA: Diagnosis not present

## 2020-05-15 DIAGNOSIS — I4891 Unspecified atrial fibrillation: Secondary | ICD-10-CM | POA: Diagnosis not present

## 2020-05-15 DIAGNOSIS — J189 Pneumonia, unspecified organism: Secondary | ICD-10-CM | POA: Diagnosis not present

## 2020-05-15 LAB — BASIC METABOLIC PANEL
Anion gap: 11 (ref 5–15)
Anion gap: 10 (ref 5–15)
BUN: 54 mg/dL — ABNORMAL HIGH (ref 8–23)
BUN: 50 mg/dL — ABNORMAL HIGH (ref 8–23)
CO2: 23 mmol/L (ref 22–32)
CO2: 27 mmol/L (ref 22–32)
Calcium: 8.1 mg/dL — ABNORMAL LOW (ref 8.9–10.3)
Calcium: 8.6 mg/dL — ABNORMAL LOW (ref 8.9–10.3)
Chloride: 96 mmol/L — ABNORMAL LOW (ref 98–111)
Chloride: 93 mmol/L — ABNORMAL LOW (ref 98–111)
Creatinine, Ser: 1.31 mg/dL — ABNORMAL HIGH (ref 0.44–1.00)
Creatinine, Ser: 1.27 mg/dL — ABNORMAL HIGH (ref 0.44–1.00)
GFR calc Af Amer: 43 mL/min — ABNORMAL LOW (ref >60)
GFR calc Af Amer: 44 mL/min — ABNORMAL LOW (ref >60)
GFR calc non Af Amer: 37 mL/min — ABNORMAL LOW (ref >60)
GFR calc non Af Amer: 38 mL/min — ABNORMAL LOW (ref >60)
Glucose, Bld: 90 mg/dL (ref 70–99)
Glucose, Bld: 119 mg/dL — ABNORMAL HIGH (ref 70–99)
Potassium: 4.3 mmol/L (ref 3.5–5.1)
Potassium: 4.7 mmol/L (ref 3.5–5.1)
Sodium: 130 mmol/L — ABNORMAL LOW (ref 135–145)
Sodium: 130 mmol/L — ABNORMAL LOW (ref 135–145)

## 2020-05-15 LAB — STREP PNEUMONIAE URINARY ANTIGEN: Strep Pneumo Urinary Antigen: NEGATIVE

## 2020-05-15 LAB — LIPID PANEL
Cholesterol: 89 mg/dL (ref 0–200)
HDL: 33 mg/dL — ABNORMAL LOW (ref >40)
LDL Cholesterol: 43 mg/dL (ref 0–99)
Total CHOL/HDL Ratio: 2.7 RATIO
Triglycerides: 64 mg/dL (ref <150)
VLDL: 13 mg/dL (ref 0–40)

## 2020-05-15 LAB — TROPONIN I (HIGH SENSITIVITY): Troponin I (High Sensitivity): 970 ng/L (ref <18)

## 2020-05-15 LAB — HEMOGLOBIN A1C
Hgb A1c MFr Bld: 6.4 % — ABNORMAL HIGH (ref 4.8–5.6)
Mean Plasma Glucose: 136.98 mg/dL

## 2020-05-15 LAB — TSH: TSH: 1.117 u[IU]/mL (ref 0.350–4.500)

## 2020-05-15 LAB — HEPARIN LEVEL (UNFRACTIONATED): Heparin Unfractionated: <0.1 IU/mL — ABNORMAL LOW (ref 0.30–0.70)

## 2020-05-15 MED ORDER — VANCOMYCIN HCL 750 MG/150ML IV SOLN
750.0000 mg | INTRAVENOUS | Status: DC
  Administered 2020-05-16 – 2020-05-18 (×2): 750 mg via INTRAVENOUS
  Filled 2020-05-15 (×2): qty 150

## 2020-05-15 MED ORDER — FUROSEMIDE 10 MG/ML IJ SOLN
40.0000 mg | Freq: Once | INTRAMUSCULAR | Status: AC
  Administered 2020-05-15: 40 mg via INTRAVENOUS
  Filled 2020-05-15: qty 4

## 2020-05-15 MED ORDER — ORAL CARE MOUTH RINSE
15.0000 mL | Freq: Two times a day (BID) | OROMUCOSAL | Status: DC
  Administered 2020-05-15 – 2020-05-26 (×21): 15 mL via OROMUCOSAL

## 2020-05-15 MED ORDER — METOPROLOL TARTRATE 5 MG/5ML IV SOLN
5.0000 mg | Freq: Four times a day (QID) | INTRAVENOUS | Status: DC | PRN
  Filled 2020-05-15: qty 5

## 2020-05-15 MED ORDER — HEPARIN BOLUS VIA INFUSION
1250.0000 [IU] | Freq: Once | INTRAVENOUS | Status: AC
  Administered 2020-05-15: 1250 [IU] via INTRAVENOUS
  Filled 2020-05-15: qty 1250

## 2020-05-15 NOTE — ED Notes (Signed)
Breakfast try provided for pt at this time.

## 2020-05-15 NOTE — Consult Note (Signed)
ANTICOAGULATION CONSULT NOTE - Initial Consult  Pharmacy Consult for Heparin Infusion Indication: chest pain/ACS and atrial fibrillation  Allergies  Allergen Reactions  . Tramadol     confusion    Patient Measurements: Height: 5' (152.4 cm) Weight: 42.2 kg (93 lb) IBW/kg (Calculated) : 45.5 Heparin Dosing Weight: 42.2 kg  Vital Signs: BP: 95/64 (09/25 2330) Pulse Rate: 87 (09/25 2330)  Labs: Recent Labs    04/24/2020 1232 05/08/2020 1232 05/08/2020 1630 04/23/2020 2014 04/22/2020 2029 05/09/2020 2355 05/15/20 0247 05/15/20 0248  HGB 12.3  --   --   --   --   --   --   --   HCT 35.7*  --   --   --   --   --   --   --   PLT 372  --   --   --   --   --   --   --   APTT  --   --  34  --   --   --   --   --   LABPROT  --   --  13.8  --   --   --   --   --   INR  --   --  1.1  --   --   --   --   --   HEPARINUNFRC  --   --   --   --   --   --  <0.10*  --   CREATININE 1.51*  --   --   --  1.38*  --   --  1.31*  TROPONINIHS 125*   < > 630* 1,105*  --  970*  --   --    < > = values in this interval not displayed.    Estimated Creatinine Clearance: 20.5 mL/min (A) (by C-G formula based on SCr of 1.31 mg/dL (H)).   Medical History: Past Medical History:  Diagnosis Date  . Cancer ([place]) 2010   lung  . Cough 01/19/2016  . Hypertension   . Lung cancer ([place]) 01/19/2016    Medications:  No anticoagulation prior to admission per chart review  Assessment: Patient is an 86 y/o F with medical history including lung cancer, CHF, CKD who presented with weakness. There is concern for ACS given elevated troponins (125 >> 630) from baseline of 18 on 9/10. Furthermore patient with new-onset atrial fibrillation in the setting of pneumonia. Pharmacy has been consulted to initiate heparin infusion for both atrial fibrillation and ACS.   Baseline aPTT and PT-INR within normal limits. Baseline CBC within normal limits and c/w patient's baseline.   Goal of Therapy:  Heparin level 0.3-0.7  units/ml Monitor platelets by anticoagulation protocol: Yes   Plan:  --Heparin 2000 unit IV bolus x 1 followed by continuous infusion at 500 units/hr --Will check HL 8 hours after initiation of infusion --Daily CBC per protocol  9/26:  HL @ 0247 :  < 0.1  Will order Heparin 1250 units/hr IV X 1 and increase drip rate to 650 units/hr.  Will recheck HL 8 hrs after rate change.   Makira Holleman D 05/15/2020,4:24 AM

## 2020-05-15 NOTE — Progress Notes (Signed)
PROGRESS NOTE    Stephanie Collins  HYW:[id number] DOB: 03/22/1932 DOA: 04/22/2020 PCP: Baxter Hire, MD    Assessment & Plan:   Principal Problem:   HCAP (healthcare-associated pneumonia) Active Problems:   Chronic obstructive pulmonary disease ([place])   Hyponatremia   Sepsis ([place])   Chronic diastolic CHF (congestive heart failure) ([place])   Acute renal failure superimposed on stage 3a chronic kidney disease ([place])   UTI (urinary tract infection)   Elevated troponin   New onset atrial fibrillation ([place])   Acute metabolic encephalopathy   HTN (hypertension)   Sepsis: secondary to UTI & CAP. Meet criteria w/ leukocytosis, tachycardia, tachypnea. Continue on IV vanco & cefepime. Continue on bronchodilators and encourage incentive spirometry. Blood cxs NGTD. Continue on IVFs  UTI: urine cx pending. Continue on IV cefepime.   CAP: continue on IV vanco & cefepime. Continue on bronchodilators and encourage incentive spirometry. Legionella, strep are pending   COPD: w/o exacerbation. Continue on bronchodilators  HTN: continue to hold home dose of coreg, lisinopril, lasix to low normal BP   Hyponatremia: likely secondary to dehydration & poor po intake. Continue to hold lasix. Continue on IVFs. Na Q8H   Chronic diastolic CHF: echo 4/85/4627 showed EF of 55 to 60% with grade II diastolic dysfunction.  Euvolemic. Continue to hold lasix   AKI on CKDIIIa: Cr is trending down slightly from day prior. Continue to hold lasix, lisinopril. Continue on IVFs.   A. Fib: new onset. W/ elevated troponins, likely secondary to demand ischemia & a. Fib. D/c IV heparin drip. Continue on tele. Cardio following and recs apprec   Acute metabolic encephalopathy: Likely due to multifactorial etiology, including sepsis, UTI, hypoxia. Continue w/ neuro checks   Discoloration of bilateral feet: no ischemic as per vascular surg   DVT prophylax: SCDs Code Status: full  Family Communication:    Disposition Plan: depends on PT/OT recs  Status is: Inpatient  Remains inpatient appropriate because:IV treatments appropriate due to intensity of illness or inability to take PO   Dispo: The patient is from: Home              Anticipated d/c is to: SNF              Anticipated d/c date is: 3 days              Patient currently is not medically stable to d/c.    Consultants:   Cardio  vascular   Procedures:    Antimicrobials: vanco, cefepime    Subjective: Pt c/o shortness of breath   Objective: Vitals:   05/15/20 0703 05/15/20 0830 05/15/20 1200 05/15/20 1230  BP: 96/63 113/78 114/68 117/70  Pulse: 98 94 (!) 106 100  Resp: (!) 22 (!) 25 (!) 26 (!) 27  Temp:      TempSrc:      SpO2: 94% 97% 92% 96%  Weight:      Height:        Intake/Output Summary (Last 24 hours) at 05/15/2020 1321 Last data filed at 05/17/2020 1607 Gross per 24 hour  Intake 700 ml  Output --  Net 700 ml   Filed Weights   05/13/2020 1259 05/10/2020 1523  Weight: 43.5 kg 42.2 kg    Examination:  General exam: Appears calm but uncomfortable. Frail appearing  Respiratory system: course breath sounds b/l. Rhonchi b/l Cardiovascular system: S1 & S2 +. No  rubs, gallops or clicks. Gastrointestinal system: Abdomen is nondistended, soft and nontender. Normal bowel  sounds heard. Central nervous system: Alert and oriented. Moves all 4 extremities Psychiatry: Judgement and insight appear normal. Mood & affect appropriate.     Data Reviewed: I have personally reviewed following labs and imaging studies  CBC: Recent Labs  Lab 04/29/2020 1232  WBC 22.7*  HGB 12.3  HCT 35.7*  MCV 79.5*  PLT 735   Basic Metabolic Panel: Recent Labs  Lab 05/11/2020 1232 05/10/2020 2029 05/15/20 0248  NA 128* 130* 130*  K 4.8 4.3 4.3  CL 89* 94* 96*  CO2 26 28 23   GLUCOSE 101* 141* 90  BUN 58* 56* 54*  CREATININE 1.51* 1.38* 1.31*  CALCIUM 8.8* 8.2* 8.1*   GFR: Estimated Creatinine Clearance: 20.5  mL/min (A) (by C-G formula based on SCr of 1.31 mg/dL (H)). Liver Function Tests: Recent Labs  Lab 04/29/2020 1232  AST 19  ALT 15  ALKPHOS 113  BILITOT 0.9  PROT 6.5  ALBUMIN 2.7*   No results for input(s): LIPASE, AMYLASE in the last 168 hours. No results for input(s): AMMONIA in the last 168 hours. Coagulation Profile: Recent Labs  Lab 05/19/2020 1630  INR 1.1   Cardiac Enzymes: No results for input(s): CKTOTAL, CKMB, CKMBINDEX, TROPONINI in the last 168 hours. BNP (last 3 results) No results for input(s): PROBNP in the last 8760 hours. HbA1C: Recent Labs    05/15/20 0248  HGBA1C 6.4*   CBG: No results for input(s): GLUCAP in the last 168 hours. Lipid Profile: Recent Labs    05/15/20 0248  CHOL 89  HDL 33*  LDLCALC 43  TRIG 64  CHOLHDL 2.7   Thyroid Function Tests: Recent Labs    05/15/20 0248  TSH 1.117   Anemia Panel: No results for input(s): VITAMINB12, FOLATE, FERRITIN, TIBC, IRON, RETICCTPCT in the last 72 hours. Sepsis Labs: Recent Labs  Lab 04/23/2020 1406 05/05/2020 1630  PROCALCITON  --  0.23  LATICACIDVEN 1.3  --     Recent Results (from the past 240 hour(s))  Blood culture (routine x 2)     Status: None (Preliminary result)   Collection Time: 05/04/2020  2:06 PM   Specimen: BLOOD  Result Value Ref Range Status   Specimen Description BLOOD LEFT FORE ARM  Final   Special Requests   Final    BOTTLES DRAWN AEROBIC AND ANAEROBIC Blood Culture adequate volume   Culture   Final    NO GROWTH < 24 HOURS Performed at [facility], [address] [place], [place] [address]    Report Status PENDING  Incomplete  Blood culture (routine x 2)     Status: None (Preliminary result)   Collection Time: 05/02/2020  2:06 PM   Specimen: BLOOD  Result Value Ref Range Status   Specimen Description BLOOD RIGHT AC  Final   Special Requests   Final    BOTTLES DRAWN AEROBIC AND ANAEROBIC Blood Culture results may not be optimal due to an excessive volume  of blood received in culture bottles   Culture   Final    NO GROWTH < 24 HOURS Performed at [facility], [address] [place],  [address]    Report Status PENDING  Incomplete  Respiratory Panel by RT PCR (Flu A&B, Covid) - Nasopharyngeal Swab     Status: None   Collection Time: 04/26/2020  2:06 PM   Specimen: Nasopharyngeal Swab  Result Value Ref Range Status   SARS Coronavirus 2 by RT PCR NEGATIVE NEGATIVE Final    Comment: (NOTE) SARS-CoV-2 target  nucleic acids are NOT DETECTED.  The SARS-CoV-2 RNA is generally detectable in upper respiratoy specimens during the acute phase of infection. The lowest concentration of SARS-CoV-2 viral copies this assay can detect is 131 copies/mL. A negative result does not preclude SARS-Cov-2 infection and should not be used as the sole basis for treatment or other patient management decisions. A negative result may occur with  improper specimen collection/handling, submission of specimen other than nasopharyngeal swab, presence of viral mutation(s) within the areas targeted by this assay, and inadequate number of viral copies (<131 copies/mL). A negative result must be combined with clinical observations, patient history, and epidemiological information. The expected result is Negative.  Fact Sheet for Patients:  PinkCheek.be  Fact Sheet for Healthcare Providers:  GravelBags.it  This test is no t yet approved or cleared by the [place] FDA and  has been authorized for detection and/or diagnosis of SARS-CoV-2 by FDA under an Emergency Use Authorization (EUA). This EUA will remain  in effect (meaning this test can be used) for the duration of the COVID-19 declaration under Section 564(b)(1) of the Act, 21 U.S.C. section 360bbb-3(b)(1), unless the authorization is terminated or revoked sooner.     Influenza A by PCR NEGATIVE NEGATIVE Final   Influenza B by PCR  NEGATIVE NEGATIVE Final    Comment: (NOTE) The Xpert Xpress SARS-CoV-2/FLU/RSV assay is intended as an aid in  the diagnosis of influenza from Nasopharyngeal swab specimens and  should not be used as a sole basis for treatment. Nasal washings and  aspirates are unacceptable for Xpert Xpress SARS-CoV-2/FLU/RSV  testing.  Fact Sheet for Patients: PinkCheek.be  Fact Sheet for Healthcare Providers: GravelBags.it  This test is not yet approved or cleared by the [place] FDA and  has been authorized for detection and/or diagnosis of SARS-CoV-2 by  FDA under an Emergency Use Authorization (EUA). This EUA will remain  in effect (meaning this test can be used) for the duration of the  Covid-19 declaration under Section 564(b)(1) of the Act, 21  U.S.C. section 360bbb-3(b)(1), unless the authorization is  terminated or revoked. Performed at [facility], [address] [place], [place] [address]   MRSA PCR Screening     Status: Abnormal   Collection Time: 05/16/2020  8:14 PM   Specimen: Nasopharyngeal  Result Value Ref Range Status   MRSA by PCR POSITIVE (A) NEGATIVE Final    Comment:        The GeneXpert MRSA Assay (FDA approved for NASAL specimens only), is one component of a comprehensive MRSA colonization surveillance program. It is not intended to diagnose MRSA infection nor to guide or monitor treatment for MRSA infections. CRITICAL RESULT CALLED TO, READ BACK BY AND VERIFIED WITH: MEGAN GRAF @2207  04/21/2020 TTG Performed at [facility], [address], [place], [place] [address]          Radiology Studies: DG Chest Portable 1 View  Result Date: 05/18/2020 CLINICAL DATA:  Patient with weakness.  History of lung cancer. EXAM: PORTABLE CHEST 1 VIEW COMPARISON:  Chest radiograph April 29, 2020 FINDINGS: Monitoring leads overlie the patient. Stable cardiac and mediastinal contours.  Bilateral pulmonary vascular redistribution. Mild interstitial opacities. Small bilateral pleural effusions. IMPRESSION: Increased bibasilar airspace opacities which may represent atelectasis or infection. Probable small bilateral pleural effusions. Bilateral interstitial opacities may represent edema. Electronically Signed   By: Lovey Newcomer M.D.   On: 05/11/2020 13:47        Scheduled Meds: . acetylcysteine  3  mL Nebulization BID  . aspirin EC  81 mg Oral Daily  . atorvastatin  40 mg Oral Daily  . Chlorhexidine Gluconate Cloth  6 each Topical Q0600  . cholecalciferol  2,000 Units Oral Daily  . ipratropium-albuterol  3 mL Nebulization Q4H  . mupirocin ointment  1 application Nasal BID   Continuous Infusions: . ceFEPime (MAXIPIME) IV    . [START ON 05/16/2020] vancomycin       LOS: 1 day    Time spent: 33 mins    Wyvonnia Dusky, MD [facility] Pager [phone]  If 7PM-7AM, please contact night-coverage [email or web address] 05/15/2020, 1:21 PM

## 2020-05-15 NOTE — Consult Note (Signed)
Pharmacy Antibiotic Note  Stephanie Collins is a 86 y.o. female with medical history including COPD, lung cancer, dCHF, CKD stage III admitted on 04/28/2020 with pneumonia and a positive MRSA swab, also with discoloration of both feet.  Pharmacy was consulted for cefepime and vancomycin dosing. Her renal function has improved since admission but is not yet back to her baseline level.  Plan:  1) continue cefepime 2 grams IV every 24 hours  2) adjust vancomycin dose to 750 mg IV every 48 hours  Ke: 0.021 h-1, T1/2: 32.4 h  Daily Scr while on vancomycin to assess renal function  Vancomycin levels as clinically indicated    Height: 5' (152.4 cm) Weight: 42.2 kg (93 lb) IBW/kg (Calculated) : 45.5  Temp (24hrs), Avg:98.1 F (36.7 C), Min:98.1 F (36.7 C), Max:98.1 F (36.7 C)  Recent Labs  Lab 05/17/2020 1232 05/04/2020 1406 04/20/2020 2029 05/15/20 0248  WBC 22.7*  --   --   --   CREATININE 1.51*  --  1.38* 1.31*  LATICACIDVEN  --  1.3  --   --     Estimated Creatinine Clearance: 20.5 mL/min (A) (by C-G formula based on SCr of 1.31 mg/dL (H)).    Allergies  Allergen Reactions   Tramadol     confusion    Antimicrobials this admission: Ceftriaxone 9/25 x 1 in the ED Cefepime 9/25 >>  Vancomycin 9/25 >>   Microbiology results: 9/25 BCx: NG<24h 9/25 UCx: pending  9/25 Sputum: pending  9/25 MRSA PCR: positive 9/25 Respiratory panel (Flu A&B, Covid): negative  Thank you for allowing pharmacy to be a part of this patients care.  Dallie Piles 05/15/2020 8:26 AM

## 2020-05-15 NOTE — ED Notes (Signed)
Lab bedside for morning lab draw.

## 2020-05-15 NOTE — ED Notes (Signed)
Pt repositioned by this RN and reassured that she is able to urinate d/t the purewick being in place. Pt is concerned about "the canister on the wall leaking" and states "I feel like I am being chained down in here".  Pt attempting to use TV remote as phone. Pt easily redirected about concerns. Pt's door is open and is in sight of this RN for pt reassurance that she is not left alone.

## 2020-05-15 NOTE — Progress Notes (Signed)
SUBJECTIVE: Patient is breathing much better no chest pain   Vitals:   05/06/2020 2300 05/07/2020 2330 05/15/20 0456 05/15/20 0703  BP: (!) 92/59 95/64 109/75 96/63  Pulse: 93 87 86 98  Resp:   (!) 22 (!) 22  Temp:      TempSrc:      SpO2: 92% 92% 100% 94%  Weight:      Height:        Intake/Output Summary (Last 24 hours) at 05/15/2020 1059 Last data filed at 05/13/2020 1607 Gross per 24 hour  Intake 700 ml  Output --  Net 700 ml    LABS: Basic Metabolic Panel: Recent Labs    04/25/2020 2029 05/15/20 0248  NA 130* 130*  K 4.3 4.3  CL 94* 96*  CO2 28 23  GLUCOSE 141* 90  BUN 56* 54*  CREATININE 1.38* 1.31*  CALCIUM 8.2* 8.1*   Liver Function Tests: Recent Labs    05/03/2020 1232  AST 19  ALT 15  ALKPHOS 113  BILITOT 0.9  PROT 6.5  ALBUMIN 2.7*   No results for input(s): LIPASE, AMYLASE in the last 72 hours. CBC: Recent Labs    04/20/2020 1232  WBC 22.7*  HGB 12.3  HCT 35.7*  MCV 79.5*  PLT 372   Cardiac Enzymes: No results for input(s): CKTOTAL, CKMB, CKMBINDEX, TROPONINI in the last 72 hours. BNP: Invalid input(s): POCBNP D-Dimer: No results for input(s): DDIMER in the last 72 hours. Hemoglobin A1C: No results for input(s): HGBA1C in the last 72 hours. Fasting Lipid Panel: Recent Labs    05/15/20 0248  CHOL 89  HDL 33*  LDLCALC 43  TRIG 64  CHOLHDL 2.7   Thyroid Function Tests: Recent Labs    05/15/20 0248  TSH 1.117   Anemia Panel: No results for input(s): VITAMINB12, FOLATE, FERRITIN, TIBC, IRON, RETICCTPCT in the last 72 hours.   PHYSICAL EXAM General: Well developed, well nourished, in no acute distress HEENT:  Normocephalic and atramatic Neck:  No JVD.  Lungs: Clear bilaterally to auscultation and percussion. Heart: HRRR . Normal S1 and S2 without gallops or murmurs.  Abdomen: Bowel sounds are positive, abdomen soft and non-tender  Msk:  Back normal, normal gait. Normal strength and tone for age. Extremities: No clubbing,  cyanosis or edema.   Neuro: Alert and oriented X 3. Psych:  Good affect, responds appropriately  TELEMETRY: Atrial fibrillation  ASSESSMENT AND PLAN: Atrial fibrillation which is chronic/sepsis/heart failure/renal insufficiency/elevated troponin due to demand ischemia secondary to sepsis.  Discussed with the daughter about cardiac catheterization and the family wants to treat the patient conservatively.  Principal Problem:   HCAP (healthcare-associated pneumonia) Active Problems:   Chronic obstructive pulmonary disease ([facility])   Hyponatremia   Sepsis ([place])   Chronic diastolic CHF (congestive heart failure) ([facility])   Acute renal failure superimposed on stage 3a chronic kidney disease (H[facility])   UTI (urinary tract infection)   Elevated troponin   New onset atrial fibrillation ([facility])   Acute metabolic encephalopathy   HTN (hypertension)    Neoma Laming A, MD, [facility] 05/15/2020 10:59 AM

## 2020-05-15 NOTE — ED Notes (Signed)
Pt repositioned by this RN and and melody NT. Pt able to urinate via purewick.

## 2020-05-15 NOTE — Progress Notes (Signed)
   05/15/20 1820  Assess: MEWS Score  Temp 98.8 F (37.1 C)  BP 115/73  Pulse Rate (!) 109  Resp (!) 27  SpO2 92 %  O2 Device Nasal Cannula  O2 Flow Rate (L/min) 3 L/min  Assess: MEWS Score  MEWS Temp 0  MEWS Systolic 0  MEWS Pulse 1  MEWS RR 2  MEWS LOC 0  MEWS Score 3  MEWS Score Color Yellow  Assess: if the MEWS score is Yellow or Red  Were vital signs taken at a resting state? Yes  Focused Assessment No change from prior assessment  Early Detection of Sepsis Score *See Row Information* Medium  MEWS guidelines implemented *See Row Information* Yes  Treat  MEWS Interventions Administered scheduled meds/treatments  Pain Scale 0-10  Take Vital Signs  Increase Vital Sign Frequency  Yellow: Q 2hr X 2 then Q 4hr X 2, if remains yellow, continue Q 4hrs  Escalate  MEWS: Escalate Yellow: discuss with charge nurse/RN and consider discussing with provider and RRT  Notify: Charge Nurse/RN  Name of Charge Nurse/RN Notified JAck RN  Date Charge Nurse/RN Notified 05/15/20  Time Charge Nurse/RN Notified 1902  Notify: Provider  Provider Name/Title Dr. [name]  Date Provider Notified 05/15/20  Time Provider Notified 1902  Notification Type Page  Notification Reason Change in status  Response See new orders  Date of Provider Response 05/15/20  Time of Provider Response 1904

## 2020-05-16 DIAGNOSIS — E871 Hypo-osmolality and hyponatremia: Secondary | ICD-10-CM | POA: Diagnosis not present

## 2020-05-16 DIAGNOSIS — J189 Pneumonia, unspecified organism: Secondary | ICD-10-CM | POA: Diagnosis not present

## 2020-05-16 DIAGNOSIS — I4891 Unspecified atrial fibrillation: Secondary | ICD-10-CM | POA: Diagnosis not present

## 2020-05-16 LAB — BASIC METABOLIC PANEL
Anion gap: 12 (ref 5–15)
BUN: 50 mg/dL — ABNORMAL HIGH (ref 8–23)
CO2: 23 mmol/L (ref 22–32)
Calcium: 8.2 mg/dL — ABNORMAL LOW (ref 8.9–10.3)
Chloride: 94 mmol/L — ABNORMAL LOW (ref 98–111)
Creatinine, Ser: 1.42 mg/dL — ABNORMAL HIGH (ref 0.44–1.00)
GFR calc Af Amer: 39 mL/min — ABNORMAL LOW (ref >60)
GFR calc non Af Amer: 33 mL/min — ABNORMAL LOW (ref >60)
Glucose, Bld: 127 mg/dL — ABNORMAL HIGH (ref 70–99)
Potassium: 4.1 mmol/L (ref 3.5–5.1)
Sodium: 129 mmol/L — ABNORMAL LOW (ref 135–145)

## 2020-05-16 LAB — CBC
HCT: 32.1 % — ABNORMAL LOW (ref 36.0–46.0)
Hemoglobin: 11.3 g/dL — ABNORMAL LOW (ref 12.0–15.0)
MCH: 28 pg (ref 26.0–34.0)
MCHC: 35.2 g/dL (ref 30.0–36.0)
MCV: 79.7 fL — ABNORMAL LOW (ref 80.0–100.0)
Platelets: 359 10*3/uL (ref 150–400)
RBC: 4.03 MIL/uL (ref 3.87–5.11)
RDW: 15.7 % — ABNORMAL HIGH (ref 11.5–15.5)
WBC: 28.2 10*3/uL — ABNORMAL HIGH (ref 4.0–10.5)
nRBC: 0 % (ref 0.0–0.2)

## 2020-05-16 MED ORDER — IPRATROPIUM-ALBUTEROL 0.5-2.5 (3) MG/3ML IN SOLN
3.0000 mL | Freq: Three times a day (TID) | RESPIRATORY_TRACT | Status: DC
  Administered 2020-05-16 – 2020-05-19 (×9): 3 mL via RESPIRATORY_TRACT
  Filled 2020-05-16 (×9): qty 3

## 2020-05-16 MED ORDER — ENOXAPARIN SODIUM 40 MG/0.4ML ~~LOC~~ SOLN
40.0000 mg | SUBCUTANEOUS | Status: DC
  Administered 2020-05-16: 40 mg via SUBCUTANEOUS
  Filled 2020-05-16: qty 0.4

## 2020-05-16 MED ORDER — MIDODRINE HCL 5 MG PO TABS
5.0000 mg | ORAL_TABLET | Freq: Once | ORAL | Status: AC
  Administered 2020-05-16: 5 mg via ORAL
  Filled 2020-05-16: qty 1

## 2020-05-16 MED ORDER — ENOXAPARIN SODIUM 30 MG/0.3ML ~~LOC~~ SOLN
30.0000 mg | SUBCUTANEOUS | Status: DC
  Administered 2020-05-17 – 2020-05-23 (×7): 30 mg via SUBCUTANEOUS
  Filled 2020-05-16 (×7): qty 0.3

## 2020-05-16 NOTE — Progress Notes (Signed)
Pt has a score of 4 on the RT protocol assessment. Per her daughter, she takes treatments at home BID. The treatments are changed to TID.

## 2020-05-16 NOTE — Evaluation (Signed)
Physical Therapy Evaluation Patient Details Name: Stephanie Collins MRN: [id number] DOB: 27-Apr-1932 Today's Date: 05/16/2020   History of Present Illness  Stephanie Collins is an 86 y/o female who was admitted with complaints of SOB, diarrhea, discoloration of bilateral feet, increased urinary frequency, confusion, and generalized weakness. PMH includes HTN, COPD on 2L O2, lung cancer, dCHF, CKD stage III, and hyponatrimia.  Clinical Impression  Pt received lying in bed with daughter at bedside. Pt pleasant and agreeable to PT evaluation this morning. Pt presents with some confusion (per daughter) and requires increased time to perform tasks of single and multi step commands. Pt able to perform supine to sit from elevated HOB with increased time and use of bedrail and requires assistance for transfers (CGA) and ambulation (min A). Pt not at baseline level of functional and at this point is a falls risk due to decreased balance. Pt requested not to use AD with mobility however was noted to be reaching out for objects for balance. Pt would benefit from use of AD for balance and improved safety with mobility. Recommend skilled PT to address deficits in strength, balance, and functional activity tolerance. Pt would benefit from STR and supervision for OOB activities/mobility at discharge from acute stay to optimize return to PLOF and maximize functional mobility, safety, and independence.   Vitals: In bed (HOB elevated): 99 HR, 25-29 RR, 93%, 92/57 BP LUE Sitting edge of bed: 88/63 BP In recliner chair: 100 HR, 29 RR, 93%     Follow Up Recommendations SNF;Supervision for mobility/OOB    Equipment Recommendations  None recommended by PT;Other (comment) (pt has equipment)    Recommendations for Other Services       Precautions / Restrictions Precautions Precautions: Fall Restrictions Weight Bearing Restrictions: No      Mobility  Bed Mobility Overal bed mobility: Modified Independent              General bed mobility comments: Pt required increased time and use of bedrail to perform supine to sit edge of bed from elevated HOB; pt required leaning rest break mid way before completing motions  Transfers Overall transfer level: Needs assistance Equipment used: None Transfers: Sit to/from Stand Sit to Stand: Min guard         General transfer comment: CGA for sit <> stand transfer from various surfaces including bed. BSC, and recliner chair. Pt required BUE assist to power into standing. Verbal cues for hand placement for improved efficacy  Ambulation/Gait Ambulation/Gait assistance: Min assist Gait Distance (Feet): 2 Feet Assistive device: 1 person hand held assist Gait Pattern/deviations: Step-through pattern;Decreased step length - left;Decreased step length - right;Trunk flexed Gait velocity: decreased   General Gait Details: HHA provided as pt deferred use of RW for mobility - pt noted to be reaching out for objects for balance with min A for balance and safety  Stairs            Wheelchair Mobility    Modified Rankin (Stroke Patients Only)       Balance Overall balance assessment: Needs assistance Sitting-balance support: Single extremity supported;Feet supported Sitting balance-Leahy Scale: Good Sitting balance - Comments: no overt LOB while sitting with single UE support on bed with unsupported back   Standing balance support: Single extremity supported Standing balance-Leahy Scale: Fair Standing balance comment: fair balance with weight shifted anteriorly while reaching for objects for balance despite declining use of RW  Pertinent Vitals/Pain Pain Assessment: No/denies pain    Home Living Family/patient expects to be discharged to:: Private residence Living Arrangements: Alone Available Help at Discharge: Family;Available PRN/intermittently Type of Home: House Home Access: Stairs to enter Entrance  Stairs-Rails: Can reach both Entrance Stairs-Number of Steps: 4 stairs in the front, 2 stairs in the back Home Layout: One level Home Equipment: Walker - 2 wheels;Walker - 4 wheels;Cane - single point      Prior Function Level of Independence: Independent with assistive device(s)         Comments: Dtr present reports pt is mod I for use of AD (RW or SPC) in home hosever states poor compliance for AD usage at night; pt on 2L O2 at baseline; pt denies fall since previous admission     Hand Dominance   Dominant Hand: Right    Extremity/Trunk Assessment   Upper Extremity Assessment Upper Extremity Assessment: Generalized weakness (grossly 3+ to 4-/5 bilaterally)    Lower Extremity Assessment Lower Extremity Assessment: Generalized weakness (grossly 3+ to 4-/5 bilaterally)    Cervical / Trunk Assessment Cervical / Trunk Assessment: Kyphotic  Communication   Communication: HOH  Cognition Arousal/Alertness: Awake/alert Behavior During Therapy: WFL for tasks assessed/performed Overall Cognitive Status: Impaired/Different from baseline Area of Impairment: Orientation;Safety/judgement;Problem solving                 Orientation Level: Person;Place     Following Commands: Follows one step commands consistently;Follows one step commands with increased time;Follows multi-step commands inconsistently;Follows multi-step commands with increased time Safety/Judgement: Decreased awareness of safety   Problem Solving: Slow processing;Difficulty sequencing;Requires verbal cues General Comments: Pt able to state name, DOB, and location however daughter, who is present, reports that pt has been confused off an on during this hospital stay and before.      General Comments General comments (skin integrity, edema, etc.): bilateral feet noted to be warm to touch and with purple areas of skin; sore to dorsal surface of R foot with yellowish scab    Exercises Other Exercises Other  Exercises: additonal time taken as pt reported need to urinate while performing 5xSTS; clinician retrieved BSC from bathroom and pt assisted to/from [place] without pt voiding; purewick put back in place once seated in recliner chair Other Exercises: pt began to perform 5xSTS and during second transfer attempt, pt reporting needing to void   Assessment/Plan    PT Assessment Patient needs continued PT services  PT Problem List Decreased strength;Decreased activity tolerance;Decreased balance;Decreased mobility;Decreased cognition;Decreased safety awareness       PT Treatment Interventions DME instruction;Gait training;Stair training;Functional mobility training;Therapeutic activities;Therapeutic exercise;Balance training;Patient/family education    PT Goals (Current goals can be found in the Care Plan section)  Acute Rehab PT Goals Patient Stated Goal: to go home PT Goal Formulation: With patient Time For Goal Achievement: 05/30/20 Potential to Achieve Goals: Good    Frequency Min 2X/week   Barriers to discharge Decreased caregiver support      Co-evaluation               AM-PAC PT "6 Clicks" Mobility  Outcome Measure Help needed turning from your back to your side while in a flat bed without using bedrails?: A Little Help needed moving from lying on your back to sitting on the side of a flat bed without using bedrails?: A Little Help needed moving to and from a bed to a chair (including a wheelchair)?: A Little Help needed standing up from a chair using your  arms (e.g., wheelchair or bedside chair)?: A Little Help needed to walk in hospital room?: A Little Help needed climbing 3-5 steps with a railing? : A Lot 6 Click Score: 17    End of Session Equipment Utilized During Treatment: Gait belt;Oxygen;Other (comment) (3L O2 via nasal cannula) Activity Tolerance: Patient limited by fatigue;Patient tolerated treatment well Patient left: in chair;with call bell/phone within  reach;with chair alarm set;with family/visitor present;Other (comment) (daughter present) Nurse Communication: Mobility status PT Visit Diagnosis: Unsteadiness on feet (R26.81);Muscle weakness (generalized) (M62.81);History of falling (Z91.81);Other abnormalities of gait and mobility (R26.89)    Time: 0923-3007 PT Time Calculation (min) (ACUTE ONLY): 42 min   Charges:              Vale Haven, SPT  Vale Haven 05/16/2020, 11:55 AM

## 2020-05-16 NOTE — Progress Notes (Signed)
Mobility Specialist - Progress Note   05/16/20 1351  Mobility  Range of Motion/Exercises Right leg;Left leg (ankle pumps, quad sets, hip iso, slr, hip add/abd)  Level of Assistance Standby assist, set-up cues, supervision of patient - no hands on  Assistive Device None  Distance Ambulated (ft) 0 ft  Mobility Response Tolerated well  Mobility performed by Mobility specialist  $Mobility charge 1 Mobility    Pre-mobility: 92 HR, 90/50 BP, 93% SpO2 During mobility: 100 HR, 92% SpO2 Post-mobility: 94 HR, 91/48 BP, 93% SpO2   Pt was sitting in recliner upon arrival. Pt agreed to session. Pt denied any pain, nausea, or dizziness. Pt stated she had been active all morning as she did work with PT earlier. Pt performed exercises in chair: ankle pumps (15x/leg), quad sets, straight leg raises, hip isometrics, hip add/abd (10x/leg) with SBA. Pt was minA performing straight leg raises and needed VC to slow and control movement. Overall, pt tolerated session well. Pt remains in recliner with all needs in reach.    Kathee Delton Mobility Specialist 05/16/20, 1:57 PM

## 2020-05-16 NOTE — Progress Notes (Signed)
PHARMACIST - PHYSICIAN COMMUNICATION  CONCERNING:  Enoxaparin (Lovenox) for DVT Prophylaxis    RECOMMENDATION: Patient was prescribed enoxaprin 40mg  q24 hours for VTE prophylaxis.   Filed Weights   05/15/2020 1259 05/09/2020 1523  Weight: 43.5 kg (96 lb) 42.2 kg (93 lb)    Body mass index is 18.16 kg/m.  Estimated Creatinine Clearance: 18.9 mL/min (A) (by C-G formula based on SCr of 1.42 mg/dL (H)).    Patient is candidate for enoxaparin 30mg  every 24 hours based on CrCl <55ml/min and/or Weight <45kg  DESCRIPTION: Pharmacy has adjusted enoxaparin dose per [facility] policy.  Patient is now receiving enoxaparin 30mg  every 24 hours   Pernell Dupre, PharmD, BCPS Clinical Pharmacist 05/16/2020 3:54 PM

## 2020-05-16 NOTE — Progress Notes (Signed)
SUBJECTIVE: Patient is comfortable   Vitals:   05/16/20 0202 05/16/20 0602 05/16/20 0900 05/16/20 0906  BP: 92/70 (!) 92/57 (!) 99/31 (!) 97/51  Pulse: (!) 103 98 99 94  Resp: 20 (!) 21 20 20   Temp: 98.8 F (37.1 C) 98.4 F (36.9 C) (!) 97.5 F (36.4 C) 97.6 F (36.4 C)  TempSrc: Oral Oral Oral Oral  SpO2: 91%   95%  Weight:      Height:        Intake/Output Summary (Last 24 hours) at 05/16/2020 1025 Last data filed at 05/16/2020 1007 Gross per 24 hour  Intake 680 ml  Output --  Net 680 ml    LABS: Basic Metabolic Panel: Recent Labs    05/15/20 0248 05/15/20 1431  NA 130* 130*  K 4.3 4.7  CL 96* 93*  CO2 23 27  GLUCOSE 90 119*  BUN 54* 50*  CREATININE 1.31* 1.27*  CALCIUM 8.1* 8.6*   Liver Function Tests: Recent Labs    04/20/2020 1232  AST 19  ALT 15  ALKPHOS 113  BILITOT 0.9  PROT 6.5  ALBUMIN 2.7*   No results for input(s): LIPASE, AMYLASE in the last 72 hours. CBC: Recent Labs    04/24/2020 1232  WBC 22.7*  HGB 12.3  HCT 35.7*  MCV 79.5*  PLT 372   Cardiac Enzymes: No results for input(s): CKTOTAL, CKMB, CKMBINDEX, TROPONINI in the last 72 hours. BNP: Invalid input(s): POCBNP D-Dimer: No results for input(s): DDIMER in the last 72 hours. Hemoglobin A1C: Recent Labs    05/15/20 0248  HGBA1C 6.4*   Fasting Lipid Panel: Recent Labs    05/15/20 0248  CHOL 89  HDL 33*  LDLCALC 43  TRIG 64  CHOLHDL 2.7   Thyroid Function Tests: Recent Labs    05/15/20 0248  TSH 1.117   Anemia Panel: No results for input(s): VITAMINB12, FOLATE, FERRITIN, TIBC, IRON, RETICCTPCT in the last 72 hours.   PHYSICAL EXAM General: Well developed, well nourished, in no acute distress HEENT:  Normocephalic and atramatic Neck:  No JVD.  Lungs: Clear bilaterally to auscultation and percussion. Heart: HRRR . Normal S1 and S2 without gallops or murmurs.  Abdomen: Bowel sounds are positive, abdomen soft and non-tender  Msk:  Back normal, normal gait.  Normal strength and tone for age. Extremities: No clubbing, cyanosis or edema.   Neuro: Alert and oriented X 3. Psych:  Good affect, responds appropriately  TELEMETRY: Atrial fibrillation  ASSESSMENT AND PLAN: Elevated troponin/sepsis/atrial fibrillation which is chronic and COPD.  Discussed with daughter about cardiac catheterization or any other aggressive measure while in the hospital and it was decided to treat conservatively.  Patient has sepsis which is being treated with antibiotics.  Elevated troponin were probably related to sepsis.  Principal Problem:   HCAP (healthcare-associated pneumonia) Active Problems:   Chronic obstructive pulmonary disease (HCC)   Hyponatremia   Sepsis ([place])   Chronic diastolic CHF (congestive heart failure) (HCC)   Acute renal failure superimposed on stage 3a chronic kidney disease (HCC)   UTI (urinary tract infection)   Elevated troponin   New onset atrial fibrillation ([place])   Acute metabolic encephalopathy   HTN (hypertension)    Neoma Laming A, MD, [facility] 05/16/2020 10:25 AM

## 2020-05-16 NOTE — Evaluation (Signed)
Occupational Therapy Evaluation Patient Details Name: Stephanie Collins MRN: [id number] DOB: [birth year] Today's Date: 05/16/2020    History of Present Illness Loranda Mastel is an 86 y/o female who was admitted with complaints of SOB, diarrhea, discoloration of bilateral feet, increased urinary frequency, confusion, and generalized weakness. PMH includes HTN, COPD on 2L O2, lung cancer, dCHF, CKD stage III, and hyponatrimia.   Clinical Impression   Pt was seen for OT evaluation this date. Prior to hospital admission, pt was MOD I with fxl mobility and BADLs (ex: seated sink baths) and had family assist for IADLs including cooking/cleaning. Pt lives alone in [facility] with 2-4 STE dependent on entrance. Currently pt demonstrates impairments as described below (See OT problem list) which functionally limit her ability to perform ADL/self-care tasks. Pt currently requires MIN A with ADL transfers with RW and MIN A with seated self-feeding and LB dressing.  Pt would benefit from skilled OT services to address noted impairments and functional limitations (see below for any additional details) in order to maximize safety and independence while minimizing falls risk and caregiver burden. Upon hospital discharge, recommend STR to maximize pt safety and return to PLOF.     Follow Up Recommendations  SNF    Equipment Recommendations  3 in 1 bedside commode    Recommendations for Other Services       Precautions / Restrictions Precautions Precautions: Fall Restrictions Weight Bearing Restrictions: No      Mobility Bed Mobility Overal bed mobility: Modified Independent             General bed mobility comments: pt up to chair pre/post session  Transfers Overall transfer level: Needs assistance Equipment used: Rolling walker (2 wheeled) Transfers: Sit to/from Stand Sit to Stand: Min assist         General transfer comment: MIN A to CTS from lower chair surface with RW.    Balance Overall balance  assessment: Needs assistance Sitting-balance support: Feet supported Sitting balance-Leahy Scale: Good Sitting balance - Comments: no overt LOB while sitting with single UE support on bed with unsupported back Postural control: Posterior lean Standing balance support: Bilateral upper extremity supported Standing balance-Leahy Scale: Fair Standing balance comment: utilizes RW and beenfits from UE support to sustain static balance                           ADL either performed or assessed with clinical judgement   ADL Overall ADL's : Needs assistance/impaired Eating/Feeding: Minimal assistance;Sitting;Cueing for sequencing;Cueing for safety Eating/Feeding Details (indicate cue type and reason): to hold bowl and soup spoon or hold applesauce and spoon-pt appears to forget about holding these items and requires cueing to not let it spill Grooming: Wash/dry hands;Set up;Sitting;Cueing for sequencing               Lower Body Dressing: Moderate assistance;Sitting/lateral leans Lower Body Dressing Details (indicate cue type and reason): to don/doff socks                     Vision Patient Visual Report: No change from baseline       Perception     Praxis      Pertinent Vitals/Pain Pain Assessment: No/denies pain     Hand Dominance Right   Extremity/Trunk Assessment Upper Extremity Assessment Upper Extremity Assessment: Generalized weakness (ROM WFL, MMT grossly 3+ to 4-/5 bilaterally)   Lower Extremity Assessment Lower Extremity Assessment: Defer to PT evaluation;Generalized weakness  Cervical / Trunk Assessment Cervical / Trunk Assessment: Kyphotic   Communication Communication Communication: HOH   Cognition Arousal/Alertness: Awake/alert Behavior During Therapy: WFL for tasks assessed/performed Overall Cognitive Status: History of cognitive impairments - at baseline Area of Impairment: Orientation;Safety/judgement;Problem solving;Following  commands                 Orientation Level: Disoriented to;Situation     Following Commands: Follows one step commands consistently Safety/Judgement: Decreased awareness of safety   Problem Solving: Slow processing;Requires verbal cues General Comments: dtr reports pt more clear this AM versus last night, states the confusion/cognitive issues appear to be worse afternoon/night time.   General Comments  bilateral feet noted to be warm to touch and with purple areas of skin; sore to dorsal surface of R foot with yellowish scab    Exercises Exercises: Other exercises Other Exercises Other Exercises: OT facilitates ed re: Role of OT in acute setting, pt somewhat familiar from [facility], ed re: safety considerations-use of call bell and notified of chair alarm. Pt and dtr with good understanding. Other Exercises: pt began to perform 5xSTS and during second transfer attempt, pt reporting needing to void   Shoulder Instructions      Home Living Family/patient expects to be discharged to:: Private residence Living Arrangements: Alone Available Help at Discharge: Family;Available PRN/intermittently Type of Home: House Home Access: Stairs to enter [employer] of Steps: 4 stairs in the front, 2 stairs in the back Entrance Stairs-Rails: Can reach both Home Layout: One level     Bathroom Shower/Tub: Teacher, early years/pre: Handicapped height     Home Equipment: Environmental consultant - 2 wheels;Walker - 4 wheels;Cane - single point          Prior Functioning/Environment Level of Independence: Needs assistance  Gait / Transfers Assistance Needed: Dtr is present in room t/o session, reports pt performs fxl mobility at MOD I with RW vs SPC (alternates) in home. Pt on 2-2.5Lnc home oxygen (turn up to 2.5 with activity) at baseline. ADL's / Homemaking Assistance Needed: Dtr reports that she helps with the cooking/cleaning and med mgt as well as transportation. States that pt is  able to bathe herself-takes sink baths and can dress herself, but primarily relies on slip on shoes d/t difficulty with bending to tie shoes with laces. States that pt will do her own laundry although it is a struggle.   Comments: dtr reports she has started noticing some decreased cognition in her mother during afternoon/night time.        OT Problem List: Decreased strength;Decreased activity tolerance;Impaired balance (sitting and/or standing);Decreased safety awareness;Cardiopulmonary status limiting activity      OT Treatment/Interventions: Self-care/ADL training;Therapeutic exercise;Energy conservation;DME and/or AE instruction;Therapeutic activities;Patient/family education;Balance training    OT Goals(Current goals can be found in the care plan section) Acute Rehab OT Goals Patient Stated Goal: to go home OT Goal Formulation: With patient/family Time For Goal Achievement: 05/30/20 Potential to Achieve Goals: Good ADL Goals Pt Will Perform Lower Body Dressing: with supervision;sit to/from stand Pt Will Transfer to Toilet: with supervision;ambulating;bedside commode Pt Will Perform Toileting - Clothing Manipulation and hygiene: with supervision;sit to/from stand Pt/caregiver will Perform Home Exercise Program: Increased strength;Both right and left upper extremity;With minimal assist  OT Frequency: Min 1X/week   Barriers to D/C: Decreased caregiver support          Co-evaluation              AM-PAC OT "6 Clicks" Daily Activity  Outcome Measure Help from another person eating meals?: A Little Help from another person taking care of personal grooming?: A Little Help from another person toileting, which includes using toliet, bedpan, or urinal?: A Little Help from another person bathing (including washing, rinsing, drying)?: A Lot Help from another person to put on and taking off regular upper body clothing?: A Little Help from another person to put on and taking off  regular lower body clothing?: A Lot 6 Click Score: 16   End of Session Equipment Utilized During Treatment: Gait belt;Rolling walker;Oxygen  Activity Tolerance: Patient tolerated treatment well Patient left: in chair;with call bell/phone within reach;with chair alarm set;with family/visitor present  OT Visit Diagnosis: Other abnormalities of gait and mobility (R26.89)                Time: 1314-3888 OT Time Calculation (min): 31 min Charges:  OT General Charges $OT Visit: 1 Visit OT Evaluation $OT Eval Moderate Complexity: 1 Mod OT Treatments $Self Care/Home Management : 8-22 mins  Gerrianne Scale, MS, OTR/L ascom [phone] 05/16/20, 3:01 PM

## 2020-05-16 NOTE — Progress Notes (Signed)
PROGRESS NOTE    Stephanie Collins  YBO:[id number] DOB: 06-14-1932 DOA: 05/09/2020 PCP: Baxter Hire, MD    Assessment & Plan:   Principal Problem:   HCAP (healthcare-associated pneumonia) Active Problems:   Chronic obstructive pulmonary disease ([place])   Hyponatremia   Sepsis ([place])   Chronic diastolic CHF (congestive heart failure) ([place])   Acute renal failure superimposed on stage 3a chronic kidney disease ([place])   UTI (urinary tract infection)   Elevated troponin   New onset atrial fibrillation ([place])   Acute metabolic encephalopathy   HTN (hypertension)   Sepsis: secondary to UTI & CAP. Meet criteria w/ leukocytosis, tachycardia, tachypnea. Continue on IV vanco & cefepime. Continue on bronchodilators and encourage incentive spirometry. Blood cxs NGTD. Continue on IVFs  UTI: urine cx pending. Continue on IV cefepime.   CAP: still w/ dyspnea today. Continue on IV vanco & cefepime. Continue on bronchodilators and encourage incentive spirometry. Legionella is pending. Strept is neg  COPD: w/o exacerbation. Continue on bronchodilators  HTN: continue to hold home dose of BB, ACE-I & lasix as BP is low normal  Hyponatremia: likely secondary to dehydration & poor po intake. Continue to hold lasix. Continue on IVFs.   Chronic diastolic CHF: echo 2/77/8242 showed EF of 55 to 60% with grade II diastolic dysfunction.  Euvolemic. Continue to hold lasix   AKI on CKDIIIa: Cr is trending up from day prior. Will continue to monitor   A. Fib: new onset. W/ elevated troponins, likely secondary to demand ischemia & a. Fib. D/c IV heparin drip. Not on anticoagulation secondary to fall risk. Continue on tele. Cardio following and recs apprec   Acute metabolic encephalopathy: likely multifactorial, including CAP, UTI & hypoxia. Re-orient prn   Discoloration of bilateral feet: no ischemic as per vascular surg   DVT prophylax: lovenox  Code Status: full  Family Communication:    Disposition Plan: likely d/c to SNF   Status is: Inpatient  Remains inpatient appropriate because:IV treatments appropriate due to intensity of illness or inability to take PO   Dispo: The patient is from: Home              Anticipated d/c is to: SNF              Anticipated d/c date is: 3 days              Patient currently is not medically stable to d/c.    Consultants:   Cardio  vascular   Procedures:    Antimicrobials: vanco, cefepime    Subjective: Pt c/o shortness of breath, unchanged from day prior.  Objective: Vitals:   05/16/20 0602 05/16/20 0900 05/16/20 0906 05/16/20 1325  BP: (!) 92/57 (!) 99/31 (!) 97/51 (!) 91/55  Pulse: 98 99 94 95  Resp: (!) 21 20 20    Temp: 98.4 F (36.9 C) (!) 97.5 F (36.4 C) 97.6 F (36.4 C) 98.3 F (36.8 C)  TempSrc: Oral Oral Oral Oral  SpO2:   95% 93%  Weight:      Height:        Intake/Output Summary (Last 24 hours) at 05/16/2020 1411 Last data filed at 05/16/2020 1406 Gross per 24 hour  Intake 680 ml  Output --  Net 680 ml   Filed Weights   05/18/2020 1259 05/15/2020 1523  Weight: 43.5 kg 42.2 kg    Examination:  General exam: Appears calm but uncomfortable. Frail appearing  Respiratory system: diminished breath sounds b/l.   Cardiovascular system:  S1/S2+. No gallops or rubs Gastrointestinal system: Abdomen is nondistended, soft and nontender. Hypoactive bowel sounds heard. Central nervous system: Awake and alert. Moves all 4 extremities  Psychiatry: Judgement and insight appear abnormal. Flat mood and affect      Data Reviewed: I have personally reviewed following labs and imaging studies  CBC: Recent Labs  Lab 04/29/2020 1232 05/16/20 1055  WBC 22.7* 28.2*  HGB 12.3 11.3*  HCT 35.7* 32.1*  MCV 79.5* 79.7*  PLT 372 182   Basic Metabolic Panel: Recent Labs  Lab 05/13/2020 1232 05/08/2020 2029 05/15/20 0248 05/15/20 1431 05/16/20 1055  NA 128* 130* 130* 130* 129*  K 4.8 4.3 4.3 4.7 4.1  CL 89*  94* 96* 93* 94*  CO2 26 28 23 27 23   GLUCOSE 101* 141* 90 119* 127*  BUN 58* 56* 54* 50* 50*  CREATININE 1.51* 1.38* 1.31* 1.27* 1.42*  CALCIUM 8.8* 8.2* 8.1* 8.6* 8.2*   GFR: Estimated Creatinine Clearance: 18.9 mL/min (A) (by C-G formula based on SCr of 1.42 mg/dL (H)). Liver Function Tests: Recent Labs  Lab 05/05/2020 1232  AST 19  ALT 15  ALKPHOS 113  BILITOT 0.9  PROT 6.5  ALBUMIN 2.7*   No results for input(s): LIPASE, AMYLASE in the last 168 hours. No results for input(s): AMMONIA in the last 168 hours. Coagulation Profile: Recent Labs  Lab 05/15/2020 1630  INR 1.1   Cardiac Enzymes: No results for input(s): CKTOTAL, CKMB, CKMBINDEX, TROPONINI in the last 168 hours. BNP (last 3 results) No results for input(s): PROBNP in the last 8760 hours. HbA1C: Recent Labs    05/15/20 0248  HGBA1C 6.4*   CBG: No results for input(s): GLUCAP in the last 168 hours. Lipid Profile: Recent Labs    05/15/20 0248  CHOL 89  HDL 33*  LDLCALC 43  TRIG 64  CHOLHDL 2.7   Thyroid Function Tests: Recent Labs    05/15/20 0248  TSH 1.117   Anemia Panel: No results for input(s): VITAMINB12, FOLATE, FERRITIN, TIBC, IRON, RETICCTPCT in the last 72 hours. Sepsis Labs: Recent Labs  Lab 05/04/2020 1406 05/08/2020 1630  PROCALCITON  --  0.23  LATICACIDVEN 1.3  --     Recent Results (from the past 240 hour(s))  Urine Culture     Status: Abnormal (Preliminary result)   Collection Time: 05/05/2020 12:32 PM   Specimen: Urine, Random  Result Value Ref Range Status   Specimen Description   Final    URINE, RANDOM Performed at [facility], [address] [place], [place] [address]    Special Requests   Final    NONE Performed at [facility], [address] [place], [place] [address]    Culture (A)  Final    >=100,000 COLONIES/mL ESCHERICHIA COLI >=100,000 COLONIES/mL PROTEUS MIRABILIS SUSCEPTIBILITIES TO FOLLOW Performed at [facility], [place]  [address] [place], [place] [address]    Report Status PENDING  Incomplete  Blood culture (routine x 2)     Status: None (Preliminary result)   Collection Time: 04/28/2020  2:06 PM   Specimen: BLOOD  Result Value Ref Range Status   Specimen Description BLOOD LEFT FORE ARM  Final   Special Requests   Final    BOTTLES DRAWN AEROBIC AND ANAEROBIC Blood Culture adequate volume   Culture   Final    NO GROWTH 2 DAYS Performed at [facility], [address] [place], [place] [address]    Report Status PENDING  Incomplete  Blood culture (routine x 2)     Status: None (Preliminary result)   Collection Time: 05/04/2020  2:06 PM   Specimen: BLOOD  Result Value Ref Range Status   Specimen Description BLOOD RIGHT AC  Final   Special Requests   Final    BOTTLES DRAWN AEROBIC AND ANAEROBIC Blood Culture results may not be optimal due to an excessive volume of blood received in culture bottles   Culture   Final    NO GROWTH 2 DAYS Performed at [facility], [address] [place], [place] [address]    Report Status PENDING  Incomplete  Respiratory Panel by RT PCR (Flu A&B, Covid) - Nasopharyngeal Swab     Status: None   Collection Time: 04/21/2020  2:06 PM   Specimen: Nasopharyngeal Swab  Result Value Ref Range Status   SARS Coronavirus 2 by RT PCR NEGATIVE NEGATIVE Final    Comment: (NOTE) SARS-CoV-2 target nucleic acids are NOT DETECTED.  The SARS-CoV-2 RNA is generally detectable in upper respiratoy specimens during the acute phase of infection. The lowest concentration of SARS-CoV-2 viral copies this assay can detect is 131 copies/mL. A negative result does not preclude SARS-Cov-2 infection and should not be used as the sole basis for treatment or other patient management decisions. A negative result may occur with  improper specimen collection/handling, submission of specimen other than nasopharyngeal swab, presence of viral mutation(s) within the areas targeted by this assay,  and inadequate number of viral copies (<131 copies/mL). A negative result must be combined with clinical observations, patient history, and epidemiological information. The expected result is Negative.  Fact Sheet for Patients:  PinkCheek.be  Fact Sheet for Healthcare Providers:  GravelBags.it  This test is no t yet approved or cleared by the [place] FDA and  has been authorized for detection and/or diagnosis of SARS-CoV-2 by FDA under an Emergency Use Authorization (EUA). This EUA will remain  in effect (meaning this test can be used) for the duration of the COVID-19 declaration under Section 564(b)(1) of the Act, 21 U.S.C. section 360bbb-3(b)(1), unless the authorization is terminated or revoked sooner.     Influenza A by PCR NEGATIVE NEGATIVE Final   Influenza B by PCR NEGATIVE NEGATIVE Final    Comment: (NOTE) The Xpert Xpress SARS-CoV-2/FLU/RSV assay is intended as an aid in  the diagnosis of influenza from Nasopharyngeal swab specimens and  should not be used as a sole basis for treatment. Nasal washings and  aspirates are unacceptable for Xpert Xpress SARS-CoV-2/FLU/RSV  testing.  Fact Sheet for Patients: PinkCheek.be  Fact Sheet for Healthcare Providers: GravelBags.it  This test is not yet approved or cleared by the [place] FDA and  has been authorized for detection and/or diagnosis of SARS-CoV-2 by  FDA under an Emergency Use Authorization (EUA). This EUA will remain  in effect (meaning this test can be used) for the duration of the  Covid-19 declaration under Section 564(b)(1) of the Act, 21  U.S.C. section 360bbb-3(b)(1), unless the authorization is  terminated or revoked. Performed at [facility], [address] [place], [place] [address]   MRSA PCR Screening     Status: Abnormal   Collection Time: 05/04/2020  8:14 PM    Specimen: Nasopharyngeal  Result Value Ref Range Status   MRSA by PCR POSITIVE (A) NEGATIVE Final    Comment:        The GeneXpert MRSA Assay (FDA approved for NASAL specimens only), is one component of a comprehensive MRSA  colonization surveillance program. It is not intended to diagnose MRSA infection nor to guide or monitor treatment for MRSA infections. CRITICAL RESULT CALLED TO, READ BACK BY AND VERIFIED WITH: MEGAN GRAF @2207  04/27/2020 TTG Performed at [facility], [address], [place], [place] [address]          Radiology Studies: No results found.      Scheduled Meds: . acetylcysteine  3 mL Nebulization BID  . aspirin EC  81 mg Oral Daily  . atorvastatin  40 mg Oral Daily  . Chlorhexidine Gluconate Cloth  6 each Topical Q0600  . cholecalciferol  2,000 Units Oral Daily  . enoxaparin (LOVENOX) injection  40 mg Subcutaneous Q24H  . ipratropium-albuterol  3 mL Nebulization TID  . mouth rinse  15 mL Mouth Rinse BID  . mupirocin ointment  1 application Nasal BID   Continuous Infusions: . ceFEPime (MAXIPIME) IV 2 g (05/15/20 2138)  . vancomycin       LOS: 2 days    Time spent: 35 mins    Wyvonnia Dusky, MD [facility] Pager [phone]  If 7PM-7AM, please contact night-coverage [email or web address] 05/16/2020, 2:11 PM

## 2020-05-17 ENCOUNTER — Ambulatory Visit: Payer: Medicare Other | Admitting: Family

## 2020-05-17 DIAGNOSIS — E871 Hypo-osmolality and hyponatremia: Secondary | ICD-10-CM | POA: Diagnosis not present

## 2020-05-17 DIAGNOSIS — I4891 Unspecified atrial fibrillation: Secondary | ICD-10-CM | POA: Diagnosis not present

## 2020-05-17 DIAGNOSIS — J189 Pneumonia, unspecified organism: Secondary | ICD-10-CM | POA: Diagnosis not present

## 2020-05-17 LAB — BASIC METABOLIC PANEL
Anion gap: 9 (ref 5–15)
BUN: 48 mg/dL — ABNORMAL HIGH (ref 8–23)
CO2: 24 mmol/L (ref 22–32)
Calcium: 8.2 mg/dL — ABNORMAL LOW (ref 8.9–10.3)
Chloride: 98 mmol/L (ref 98–111)
Creatinine, Ser: 1.13 mg/dL — ABNORMAL HIGH (ref 0.44–1.00)
GFR calc Af Amer: 51 mL/min — ABNORMAL LOW (ref >60)
GFR calc non Af Amer: 44 mL/min — ABNORMAL LOW (ref >60)
Glucose, Bld: 95 mg/dL (ref 70–99)
Potassium: 4 mmol/L (ref 3.5–5.1)
Sodium: 131 mmol/L — ABNORMAL LOW (ref 135–145)

## 2020-05-17 LAB — CBC
HCT: 30.3 % — ABNORMAL LOW (ref 36.0–46.0)
Hemoglobin: 10.9 g/dL — ABNORMAL LOW (ref 12.0–15.0)
MCH: 30.4 pg (ref 26.0–34.0)
MCHC: 36 g/dL (ref 30.0–36.0)
MCV: 84.6 fL (ref 80.0–100.0)
Platelets: 333 10*3/uL (ref 150–400)
RBC: 3.58 MIL/uL — ABNORMAL LOW (ref 3.87–5.11)
RDW: 17.2 % — ABNORMAL HIGH (ref 11.5–15.5)
WBC: 18 10*3/uL — ABNORMAL HIGH (ref 4.0–10.5)
nRBC: 0 % (ref 0.0–0.2)

## 2020-05-17 LAB — LEGIONELLA PNEUMOPHILA SEROGP 1 UR AG: L. pneumophila Serogp 1 Ur Ag: NEGATIVE

## 2020-05-17 MED ORDER — FUROSEMIDE 10 MG/ML IJ SOLN
20.0000 mg | Freq: Once | INTRAMUSCULAR | Status: AC
  Administered 2020-05-17: 20 mg via INTRAVENOUS
  Filled 2020-05-17: qty 4

## 2020-05-17 MED ORDER — OXYCODONE-ACETAMINOPHEN 5-325 MG PO TABS
1.0000 | ORAL_TABLET | Freq: Four times a day (QID) | ORAL | Status: DC | PRN
  Administered 2020-05-17: 1 via ORAL
  Filled 2020-05-17: qty 1

## 2020-05-17 NOTE — NC FL2 (Signed)
[place] LEVEL OF CARE SCREENING TOOL     IDENTIFICATION  Patient Name: Stephanie Collins Birthdate: 08-06-1932 Sex: female Admission Date (Current Location): 04/20/2020  [facility] and [place] Number:  Engineering geologist and Address:         Provider Number: [id number]  Attending Physician Name and Address:  Wyvonnia Dusky, MD  Relative Name and Phone Number:       Current Level of Care: Hospital Recommended Level of Care: [place] Prior Approval Number:    Date Approved/Denied:   PASRR Number: [id number]  Discharge Plan: SNF    Current Diagnoses: Patient Active Problem List   Diagnosis Date Noted  . HCAP (healthcare-associated pneumonia) 05/06/2020  . Sepsis ([place]) 04/21/2020  . Chronic diastolic CHF (congestive heart failure) ([place]) 04/22/2020  . Acute renal failure superimposed on stage 3a chronic kidney disease ([place]) 05/18/2020  . UTI (urinary tract infection) 04/28/2020  . Elevated troponin 04/20/2020  . New onset atrial fibrillation ([place]) 05/18/2020  . Acute metabolic encephalopathy 31/49/7026  . HTN (hypertension) 04/29/2020  . Protein-calorie malnutrition, severe 05/02/2020  . CHF (congestive heart failure) ([place]) 04/30/2020  . Acute exacerbation of CHF (congestive heart failure) ([place]) 04/29/2020  . Hyponatremia 04/29/2020  . CKD (chronic kidney disease), stage III 04/29/2020  . Acute on chronic respiratory failure with hypoxia ([place]) 03/25/2020  . Acute CHF (congestive heart failure) ([place]) 03/22/2020  . Leg edema   . Cough 01/19/2016  . Malignant neoplasm of lower lobe, right bronchus or lung (CODE) 01/19/2016  . Chronic obstructive pulmonary disease ([place]) 07/28/2015  . H/O malignant neoplasm 07/28/2015  . BP (high blood pressure) 07/28/2015  . Arthritis, degenerative 07/28/2015  . OP (osteoporosis) 07/28/2015  . Abnormal loss of weight 04/26/2015  . 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    Orientation RESPIRATION BLADDER  Height & Weight     Self, Place  O2 (3L [place]) Incontinent, External catheter Weight: 42.2 kg Height:  5' (152.4 cm)  BEHAVIORAL SYMPTOMS/MOOD NEUROLOGICAL BOWEL NUTRITION STATUS      Continent Diet (Heart Healthy)  AMBULATORY STATUS COMMUNICATION OF NEEDS Skin   Extensive Assist Verbally Normal                       Personal Care Assistance Level of Assistance              Functional Limitations Info             SPECIAL CARE FACTORS FREQUENCY  PT (By licensed PT), OT (By licensed OT)                    Contractures Contractures Info: Not present    Additional Factors Info  Code Status, Allergies Code Status Info: Full Allergies Info: Tramadol           Current Medications (05/17/2020):  This is the current hospital active medication list Current Facility-Administered Medications  Medication Dose Route Frequency Provider Last Rate Last Admin  . acetaminophen (TYLENOL) tablet 650 mg  650 mg Oral Q6H PRN Ivor Costa, MD   650 mg at 05/17/20 0947  . acetylcysteine (MUCOMYST) 20 % nebulizer / oral solution 3 mL  3 mL Nebulization BID Ivor Costa, MD   3 mL at 05/17/20 0829  . albuterol (PROVENTIL) (2.5 MG/3ML) 0.083% nebulizer solution 2.5 mg  2.5 mg Nebulization Q4H PRN Ivor Costa, MD      . aspirin EC tablet 81 mg  81 mg Oral Daily Ivor Costa,  MD   81 mg at 05/17/20 0947  . atorvastatin (LIPITOR) tablet 40 mg  40 mg Oral Daily Ivor Costa, MD   40 mg at 05/17/20 0947  . calcium carbonate (TUMS - dosed in mg elemental calcium) chewable tablet 300 mg of elemental calcium  1.5 tablet Oral PRN Ivor Costa, MD      . ceFEPIme (MAXIPIME) 2 g in sodium chloride 0.9 % 100 mL IVPB  2 g Intravenous Q24H Benita Gutter, [facility]   Stopped at 05/16/20 2057  . Chlorhexidine Gluconate Cloth 2 % PADS 6 each  6 each Topical Q0600 Sharion Settler, NP   6 each at 05/17/20 0533  . cholecalciferol (VITAMIN D3) tablet 2,000 Units  2,000 Units Oral Daily Ivor Costa, MD   2,000 Units at  05/17/20 0946  . dextromethorphan-guaiFENesin (MUCINEX DM) 30-600 MG per 12 hr tablet 1 tablet  1 tablet Oral BID PRN Ivor Costa, MD   1 tablet at 05/15/20 2115  . diphenhydrAMINE (BENADRYL) capsule 25 mg  25 mg Oral QHS PRN Ivor Costa, MD      . enoxaparin (LOVENOX) injection 30 mg  30 mg Subcutaneous Q24H Hallaji, Sheema M, RPH      . ipratropium-albuterol (DUONEB) 0.5-2.5 (3) MG/3ML nebulizer solution 3 mL  3 mL Nebulization TID Wyvonnia Dusky, MD   3 mL at 05/17/20 8325  . MEDLINE mouth rinse  15 mL Mouth Rinse BID Wyvonnia Dusky, MD   15 mL at 05/16/20 1545  . metoprolol tartrate (LOPRESSOR) injection 5 mg  5 mg Intravenous Q6H PRN Wyvonnia Dusky, MD      . mupirocin ointment (BACTROBAN) 2 % 1 application  1 application Nasal BID Sharion Settler, NP   1 application at 49/82/64 0949  . ondansetron (ZOFRAN) injection 4 mg  4 mg Intravenous Q8H PRN Ivor Costa, MD      . oxyCODONE-acetaminophen (PERCOCET/ROXICET) 5-325 MG per tablet 1 tablet  1 tablet Oral Q6H PRN Wyvonnia Dusky, MD      . vancomycin (VANCOREADY) IVPB 750 mg/150 mL  750 mg Intravenous Q48H Dallie Piles, RPH 150 mL/hr at 05/16/20 1543 750 mg at 05/16/20 1543     Discharge Medications: Please see discharge summary for a list of discharge medications.  Relevant Imaging Results:  Relevant Lab Results:   Additional Information ss [id number]  Beverly Sessions, RN

## 2020-05-17 NOTE — Progress Notes (Signed)
PT Cancellation Note  Patient Details Name: Stephanie Collins MRN: [id number] DOB: 02-Mar-1932   Cancelled Treatment:    Reason Eval/Treat Not Completed: Other (comment) Pt received lying in bed, with daughter at bedside, and nurse in room administering meds. Pt noted to be with low energy and having some difficulty holding her head up and remaining alert for full participation in therapy this morning. Pt also reporting some difficulty breathing however vitals were stable. Daughter states that the doctor is starting a new antibiotic because of new bacteria that was found in patient's urine. Will make attempt at a later time/date.   [place] 05/17/2020, 10:21 AM

## 2020-05-17 NOTE — Progress Notes (Signed)
PROGRESS NOTE    Stephanie Collins  YBO:[id number] DOB: Oct 06, 1931 DOA: 05/12/2020 PCP: Baxter Hire, MD    Assessment & Plan:   Principal Problem:   HCAP (healthcare-associated pneumonia) Active Problems:   Chronic obstructive pulmonary disease ([place])   Hyponatremia   Sepsis ([place])   Chronic diastolic CHF (congestive heart failure) ([place])   Acute renal failure superimposed on stage 3a chronic kidney disease ([place])   UTI (urinary tract infection)   Elevated troponin   New onset atrial fibrillation ([place])   Acute metabolic encephalopathy   HTN (hypertension)   Sepsis: secondary to UTI & CAP. Meet criteria w/ leukocytosis, tachycardia, tachypnea. Continue on IV vanco & cefepime. Continue on bronchodilators and encourage incentive spirometry. Blood cxs NGTD.   UTI: urine cx is growing e.coli, proteus, sens pending. Continue on IV cefepime.   CAP: dyspnea unchanged. IV lasix x1. Continue on IV vanco & cefepime. Continue on bronchodilators and encourage incentive spirometry. Legionella is pending. Strept is neg  COPD: w/o exacerbation. Continue on bronchodilators  HTN: continue to hold home dose of carvedilol, ACE-I as BP is low normal    Hyponatremia: improving. Will continue to monitor   Chronic diastolic CHF: echo 2/77/8242 showed EF of 55 to 60% with grade II diastolic dysfunction.    AKI on CKDIIIa: Cr is trending down from day prior. Will continue to monitor   A. fib: new onset. W/ elevated troponins, likely secondary to demand ischemia & a. fib. D/c IV heparin drip. Not on anticoagulation secondary to fall risk. Continue on tele. Cardio following and recs apprec   Acute metabolic encephalopathy: likely multifactorial, including CAP, UTI & hypoxia. Re-orient prn   Discoloration of bilateral feet: no ischemia as per vascular surg   DVT prophylax: lovenox  Code Status: full  Family Communication: discussed pt's care w/ pt's daughter who is at bedside. Please call pt's  daughter w/ updates  Disposition Plan: likely d/c to SNF   Status is: Inpatient  Remains inpatient appropriate because:IV treatments appropriate due to intensity of illness or inability to take PO   Dispo: The patient is from: Home              Anticipated d/c is to: SNF              Anticipated d/c date is: 2 days              Patient currently is not medically stable to d/c.    Consultants:   Cardio  vascular   Procedures:    Antimicrobials: vanco, cefepime    Subjective: Pt c/o fatigue and shortness of breath.  Objective: Vitals:   05/16/20 0906 05/16/20 1325 05/16/20 2033 05/17/20 0525  BP: (!) 97/51 (!) 91/55 93/66 104/74  Pulse: 94 95 96 94  Resp: 20  16 20   Temp: 97.6 F (36.4 C) 98.3 F (36.8 C) 98 F (36.7 C) 98.2 F (36.8 C)  TempSrc: Oral Oral Oral   SpO2: 95% 93% 93% 96%  Weight:      Height:        Intake/Output Summary (Last 24 hours) at 05/17/2020 0753 Last data filed at 05/17/2020 0533 Gross per 24 hour  Intake 640 ml  Output 400 ml  Net 240 ml   Filed Weights   05/18/2020 1259 05/10/2020 1523  Weight: 43.5 kg 42.2 kg    Examination:  General exam: Appears lethargic & uncomfortable. Frail appearing  Respiratory system: decreased breath sounds b/l   Cardiovascular system: S1/S2+.  No rubs or gallops Gastrointestinal system: Abdomen is nondistended, soft and nontender. Hypoactive bowel sounds heard. Central nervous system: Appears lethargic. Moves all 4 extremities  Psychiatry: judgement and insight appear abnormal. Flat mood and affect     Data Reviewed: I have personally reviewed following labs and imaging studies  CBC: Recent Labs  Lab 05/15/2020 1232 05/16/20 1055 05/17/20 0550  WBC 22.7* 28.2* 18.0*  HGB 12.3 11.3* 10.9*  HCT 35.7* 32.1* 30.3*  MCV 79.5* 79.7* 84.6  PLT 372 359 751   Basic Metabolic Panel: Recent Labs  Lab 05/09/2020 2029 05/15/20 0248 05/15/20 1431 05/16/20 1055 05/17/20 0550  NA 130* 130* 130* 129*  131*  K 4.3 4.3 4.7 4.1 4.0  CL 94* 96* 93* 94* 98  CO2 28 23 27 23 24   GLUCOSE 141* 90 119* 127* 95  BUN 56* 54* 50* 50* 48*  CREATININE 1.38* 1.31* 1.27* 1.42* 1.13*  CALCIUM 8.2* 8.1* 8.6* 8.2* 8.2*   GFR: Estimated Creatinine Clearance: 23.8 mL/min (A) (by C-G formula based on SCr of 1.13 mg/dL (H)). Liver Function Tests: Recent Labs  Lab 05/07/2020 1232  AST 19  ALT 15  ALKPHOS 113  BILITOT 0.9  PROT 6.5  ALBUMIN 2.7*   No results for input(s): LIPASE, AMYLASE in the last 168 hours. No results for input(s): AMMONIA in the last 168 hours. Coagulation Profile: Recent Labs  Lab 05/16/2020 1630  INR 1.1   Cardiac Enzymes: No results for input(s): CKTOTAL, CKMB, CKMBINDEX, TROPONINI in the last 168 hours. BNP (last 3 results) No results for input(s): PROBNP in the last 8760 hours. HbA1C: Recent Labs    05/15/20 0248  HGBA1C 6.4*   CBG: No results for input(s): GLUCAP in the last 168 hours. Lipid Profile: Recent Labs    05/15/20 0248  CHOL 89  HDL 33*  LDLCALC 43  TRIG 64  CHOLHDL 2.7   Thyroid Function Tests: Recent Labs    05/15/20 0248  TSH 1.117   Anemia Panel: No results for input(s): VITAMINB12, FOLATE, FERRITIN, TIBC, IRON, RETICCTPCT in the last 72 hours. Sepsis Labs: Recent Labs  Lab 04/24/2020 1406 04/21/2020 1630  PROCALCITON  --  0.23  LATICACIDVEN 1.3  --     Recent Results (from the past 240 hour(s))  Urine Culture     Status: Abnormal (Preliminary result)   Collection Time: 05/18/2020 12:32 PM   Specimen: Urine, Random  Result Value Ref Range Status   Specimen Description   Final    URINE, RANDOM Performed at [facility], [address], [place], [place] [address]    Special Requests   Final    NONE Performed at [facility], [address] [place], [place] [address]    Culture (A)  Final    >=100,000 COLONIES/mL ESCHERICHIA COLI >=100,000 COLONIES/mL PROTEUS MIRABILIS CULTURE REINCUBATED FOR BETTER GROWTH  REISOLATING Performed at [facility], [place] [address], [place], [place] [address]    Report Status PENDING  Incomplete  Blood culture (routine x 2)     Status: None (Preliminary result)   Collection Time: 05/07/2020  2:06 PM   Specimen: BLOOD  Result Value Ref Range Status   Specimen Description BLOOD LEFT FORE ARM  Final   Special Requests   Final    BOTTLES DRAWN AEROBIC AND ANAEROBIC Blood Culture adequate volume   Culture   Final    NO GROWTH 3 DAYS Performed at [facility], [address], [place], [place] [address]  Report Status PENDING  Incomplete  Blood culture (routine x 2)     Status: None (Preliminary result)   Collection Time: 05/16/2020  2:06 PM   Specimen: BLOOD  Result Value Ref Range Status   Specimen Description BLOOD RIGHT AC  Final   Special Requests   Final    BOTTLES DRAWN AEROBIC AND ANAEROBIC Blood Culture results may not be optimal due to an excessive volume of blood received in culture bottles   Culture   Final    NO GROWTH 3 DAYS Performed at [facility], [address] [place], [place] [address]    Report Status PENDING  Incomplete  Respiratory Panel by RT PCR (Flu A&B, Covid) - Nasopharyngeal Swab     Status: None   Collection Time: 05/17/2020  2:06 PM   Specimen: Nasopharyngeal Swab  Result Value Ref Range Status   SARS Coronavirus 2 by RT PCR NEGATIVE NEGATIVE Final    Comment: (NOTE) SARS-CoV-2 target nucleic acids are NOT DETECTED.  The SARS-CoV-2 RNA is generally detectable in upper respiratoy specimens during the acute phase of infection. The lowest concentration of SARS-CoV-2 viral copies this assay can detect is 131 copies/mL. A negative result does not preclude SARS-Cov-2 infection and should not be used as the sole basis for treatment or other patient management decisions. A negative result may occur with  improper specimen collection/handling, submission of specimen other than nasopharyngeal swab, presence of  viral mutation(s) within the areas targeted by this assay, and inadequate number of viral copies (<131 copies/mL). A negative result must be combined with clinical observations, patient history, and epidemiological information. The expected result is Negative.  Fact Sheet for Patients:  PinkCheek.be  Fact Sheet for Healthcare Providers:  GravelBags.it  This test is no t yet approved or cleared by the [place] FDA and  has been authorized for detection and/or diagnosis of SARS-CoV-2 by FDA under an Emergency Use Authorization (EUA). This EUA will remain  in effect (meaning this test can be used) for the duration of the COVID-19 declaration under Section 564(b)(1) of the Act, 21 U.S.C. section 360bbb-3(b)(1), unless the authorization is terminated or revoked sooner.     Influenza A by PCR NEGATIVE NEGATIVE Final   Influenza B by PCR NEGATIVE NEGATIVE Final    Comment: (NOTE) The Xpert Xpress SARS-CoV-2/FLU/RSV assay is intended as an aid in  the diagnosis of influenza from Nasopharyngeal swab specimens and  should not be used as a sole basis for treatment. Nasal washings and  aspirates are unacceptable for Xpert Xpress SARS-CoV-2/FLU/RSV  testing.  Fact Sheet for Patients: PinkCheek.be  Fact Sheet for Healthcare Providers: GravelBags.it  This test is not yet approved or cleared by the [place] FDA and  has been authorized for detection and/or diagnosis of SARS-CoV-2 by  FDA under an Emergency Use Authorization (EUA). This EUA will remain  in effect (meaning this test can be used) for the duration of the  Covid-19 declaration under Section 564(b)(1) of the Act, 21  U.S.C. section 360bbb-3(b)(1), unless the authorization is  terminated or revoked. Performed at [facility], [address] [place], [place] [address]   MRSA PCR Screening      Status: Abnormal   Collection Time: 05/09/2020  8:14 PM   Specimen: Nasopharyngeal  Result Value Ref Range Status   MRSA by PCR POSITIVE (A) NEGATIVE Final    Comment:        The GeneXpert MRSA Assay (FDA approved for NASAL specimens only), is  one component of a comprehensive MRSA colonization surveillance program. It is not intended to diagnose MRSA infection nor to guide or monitor treatment for MRSA infections. CRITICAL RESULT CALLED TO, READ BACK BY AND VERIFIED WITH: MEGAN GRAF @2207  04/29/2020 TTG Performed at [facility], [address], [place], [place] [address]          Radiology Studies: No results found.      Scheduled Meds: . acetylcysteine  3 mL Nebulization BID  . aspirin EC  81 mg Oral Daily  . atorvastatin  40 mg Oral Daily  . Chlorhexidine Gluconate Cloth  6 each Topical Q0600  . cholecalciferol  2,000 Units Oral Daily  . enoxaparin (LOVENOX) injection  30 mg Subcutaneous Q24H  . ipratropium-albuterol  3 mL Nebulization TID  . mouth rinse  15 mL Mouth Rinse BID  . mupirocin ointment  1 application Nasal BID   Continuous Infusions: . ceFEPime (MAXIPIME) IV Stopped (05/16/20 2057)  . vancomycin 750 mg (05/16/20 1543)     LOS: 3 days    Time spent: 32 mins    Wyvonnia Dusky, MD [facility] Pager [phone]  If 7PM-7AM, please contact night-coverage [email or web address] 05/17/2020, 7:53 AM

## 2020-05-17 NOTE — Progress Notes (Signed)
Occupational Therapy Treatment Patient Details Name: Stephanie Collins MRN: [id number] DOB: 06/12/1932 Today's Date: 05/17/2020    History of present illness Stephanie Collins is an 86 y/o female who was admitted with complaints of SOB, diarrhea, discoloration of bilateral feet, increased urinary frequency, confusion, and generalized weakness. PMH includes HTN, COPD on 2L O2, lung cancer, dCHF, CKD stage III, and hyponatrimia.   OT comments  Pt seen for OT treatment on this date. Upon arrival to room pt awake, alert, seated upright in bed, and reports that she has "no more pain than usual" in her back. Nurse in room states that pt had been having considerable back pain earlier in the day. Pr appears oriented at present, although does not recall that her daughter had visited earlier this day. Pt expresses eagerness to participate in tx, stating she would like to get out of bed. Pt transferred supine<sit with SUPV, but required increased time to do so, having to rest several minutes after moving each LE. Pt initially sit very hunched over, but was able come to and maintain upright sitting posture after VCs. Pt transferred sit<>stand with RW and CGA, slight LOB with coming into stand, but able to correct quickly. Pt performs pivot transfer into recliner. States she does not want to use Purewick but can recognize when she needs to urinate and can notify nursing staff; RN agreeable. Pt instructed in importance of OOB movement, safety awareness, role of OT, POC. Pt left in recliner with chair alarm activated and call bell within reach. Pt making good progress toward goals and will continue to benefit from skilled OT services while hospitalized, to maximize return to PLOF, minimize risk of falls, and increase activity tolerance. Pt seems to be much more cognitively alert today than yesterday. If this continues, D/C to home with HHOT may be appropriate.    Follow Up Recommendations  Other (comment) (Question re: home with HH  vs. SNF)    Equipment Recommendations       Recommendations for Other Services      Precautions / Restrictions Precautions Precautions: Fall Precaution Comments: Watch O2 Restrictions Weight Bearing Restrictions: No       Mobility Bed Mobility Overal bed mobility: Modified Independent             General bed mobility comments: pt able to transfer supine <>sit without hands-on assistance, but required increased time/rest break  Transfers Overall transfer level: Needs assistance Equipment used: Rolling walker (2 wheeled) Transfers: Sit to/from [facility] Sit to Stand: Min guard Stand pivot transfers: Min assist            Balance Overall balance assessment: Needs assistance Sitting-balance support: Feet supported;Bilateral upper extremity supported Sitting balance-Leahy Scale: Good Sitting balance - Comments: Pt able to sit up taller on EOB after VC to do so Postural control: Left lateral lean Standing balance support: Bilateral upper extremity supported Standing balance-Leahy Scale: Fair Standing balance comment: kyphotic; slow movement                           ADL either performed or assessed with clinical judgement   ADL Overall ADL's : Needs assistance/impaired Eating/Feeding: Sitting;Set up   Grooming: Wash/dry face;Brushing hair;Wash/dry hands;Set up                                 General ADL Comments: CGA + RW for ADL t/f  Vision       Perception     Praxis      Cognition Arousal/Alertness: Awake/alert Behavior During Therapy: WFL for tasks assessed/performed                     Orientation Level: Person;Place;Time;Situation (does not recall, however, that her daugther was here earlier today)     Following Commands: Follows one step commands consistently     Problem Solving: Slow processing General Comments: Pt seemed cognitive aware, alert, and appropriate this afternoon         Exercises Other Exercises Other Exercises: Educ re: role of OT, POC, DC, OOB activity, falls prevention.   Shoulder Instructions       General Comments pt reports she is pleased to get up out of bed, is aware of importance of OOB movement    Pertinent Vitals/ Pain       Pain Assessment: 0-10 Pain Location: pt reports that she has pain in back, but "no more than usual." Nurse reports that pt had a good deal of back pain earlier today. Pain Intervention(s): Limited activity within patient's tolerance;Monitored during session;Repositioned  Home Living                                          Prior Functioning/Environment              Frequency  Min 1X/week        Progress Toward Goals  OT Goals(current goals can now be found in the care plan section)  Progress towards OT goals: Progressing toward goals  Acute Rehab OT Goals Patient Stated Goal: to go home OT Goal Formulation: With patient/family Time For Goal Achievement: 05/30/20 Potential to Achieve Goals: Good  Plan Discharge plan remains appropriate    Co-evaluation                 AM-PAC OT "6 Clicks" Daily Activity     Outcome Measure   Help from another person eating meals?: None Help from another person taking care of personal grooming?: A Little Help from another person toileting, which includes using toliet, bedpan, or urinal?: A Little Help from another person bathing (including washing, rinsing, drying)?: A Lot Help from another person to put on and taking off regular upper body clothing?: A Little Help from another person to put on and taking off regular lower body clothing?: A Lot 6 Click Score: 17    End of Session Equipment Utilized During Treatment: Gait belt;Rolling walker;Oxygen  OT Visit Diagnosis: Other abnormalities of gait and mobility (R26.89);Unsteadiness on feet (R26.81);Muscle weakness (generalized) (M62.81)   Activity Tolerance Patient tolerated  treatment well   Patient Left in chair;with call bell/phone within reach;with chair alarm set;with nursing/sitter in room   Nurse Communication Mobility status (Nurse to check in with pt soon to see if she needs to use the toilet and/or needs to transfer back to bed)        Time: 1412-1436 OT Time Calculation (min): 24 min  Charges: OT General Charges $OT Visit: 1 Visit OT Treatments $Self Care/Home Management : 23-37 mins   Josiah Lobo, PhD, [place], OTR/L ascom [phone] 05/17/20, 2:56 PM

## 2020-05-17 NOTE — Progress Notes (Signed)
Physical Therapy Treatment Patient Details Name: Stephanie Collins MRN: [id number] DOB: [birth year] Today's Date: 05/17/2020    History of Present Illness Stephanie Collins is an 86 y/o female who was admitted with complaints of SOB, diarrhea, discoloration of bilateral feet, increased urinary frequency, confusion, and generalized weakness. PMH includes HTN, COPD on 2L O2, lung cancer, dCHF, CKD stage III, and hyponatrimia.    PT Comments    Pt seated in recliner chair with mobility specialist in room finishing treatment. Pt states that she still has energy to participate in session and pleasantly agreeable. Pt performed seated therex, with verbal cues for proper technique, then reported the need to pee. Pt ambulated 2 steps to [place] [facility] using RW with min A with decreased gait speed. Pt seated on BSC with pillow placed behind her due to kyphotic posture and bony prominences. Pt reported feeling the urge to urinate however unable to void. Pt stood and performed standing marches x 20 then returned to recliner chair ambulating another 2 feet. Pt's HR jumping within 91-110 with O2 sats above 95% with all activity. Pt remained on 3L O2 throughout session. Pt limited secondary to fatigue. Pt left with all needs met and call bell/phone within reach. Continue to recommend STR at discharge to address current deficits and maximize safety and independence with functional mobility prior to discharge home.    Follow Up Recommendations  SNF;Supervision for mobility/OOB     Equipment Recommendations  None recommended by PT;Other (comment) (pt has equipment)    Recommendations for Other Services       Precautions / Restrictions Precautions Precautions: Fall Precaution Comments: Watch O2 Restrictions Weight Bearing Restrictions: No    Mobility  Bed Mobility Overal bed mobility: Modified Independent             General bed mobility comments: not performed as pt seated in recliner chair upon  arrival  Transfers Overall transfer level: Needs assistance Equipment used: Rolling walker (2 wheeled) Transfers: Sit to/from Stand Sit to Stand: Min assist Stand pivot transfers: Min assist       General transfer comment: Min A for sit <> stand transfer for boosting to stand, balance, and eccentric control on descent. Verbal cues for hand placement.  Ambulation/Gait Ambulation/Gait assistance: Min assist Gait Distance (Feet): 4 Feet Assistive device: Rolling walker (2 wheeled)   Gait velocity: decreased   General Gait Details: Pt ambulated 2 feet to and from [facility] using RW with min A for balance and safety. Verbal cues for sequencing of tasks. Noted reciprocal gait pattern.   Stairs             Wheelchair Mobility    Modified Rankin (Stroke Patients Only)       Balance Overall balance assessment: Needs assistance Sitting-balance support: Feet supported;Bilateral upper extremity supported Sitting balance-Leahy Scale: Good Sitting balance - Comments: no overt LOB while sitting in recliner chair or on BSC with BUE support on armrests Postural control: Left lateral lean Standing balance support: Bilateral upper extremity supported Standing balance-Leahy Scale: Fair Standing balance comment: requires min A for balance with BUE on RW                            Cognition Arousal/Alertness: Awake/alert Behavior During Therapy: WFL for tasks assessed/performed Overall Cognitive Status: History of cognitive impairments - at baseline                   Orientation Level: Person;Place;Time;Situation (does  not recall, however, that her daugther was here earlier today)     Following Commands: Follows one step commands consistently     Problem Solving: Slow processing General Comments: Pt shocked of the current and statued "where did the day go?" Pt reports that she was unaware of where she has been all day and was worried because she cannot remember  where she was.      Exercises Other Exercises Other Exercises: Educ re: role of OT, POC, DC, OOB activity, falls prevention. Other Exercises: pt performed standing marches x 20 bilaterally; seated therex: LAQ, heel raises, toe raises x 10 each bilaterally    General Comments General comments (skin integrity, edema, etc.): pt reports she is pleased to get up out of bed, is aware of importance of OOB movement      Pertinent Vitals/Pain Pain Assessment: No/denies pain Pain Location: pt reports that she has pain in back, but "no more than usual." Nurse reports that pt had a good deal of back pain earlier today. Pain Intervention(s): Limited activity within patient's tolerance;Monitored during session;Repositioned    Home Living                      Prior Function            PT Goals (current goals can now be found in the care plan section) Acute Rehab PT Goals Patient Stated Goal: to go home PT Goal Formulation: With patient Time For Goal Achievement: 05/30/20 Potential to Achieve Goals: Good Progress towards PT goals: Progressing toward goals    Frequency    Min 2X/week      PT Plan Current plan remains appropriate    Co-evaluation              AM-PAC PT "6 Clicks" Mobility   Outcome Measure  Help needed turning from your back to your side while in a flat bed without using bedrails?: A Little Help needed moving from lying on your back to sitting on the side of a flat bed without using bedrails?: A Little Help needed moving to and from a bed to a chair (including a wheelchair)?: A Little Help needed standing up from a chair using your arms (e.g., wheelchair or bedside chair)?: A Little Help needed to walk in hospital room?: A Little Help needed climbing 3-5 steps with a railing? : A Lot 6 Click Score: 17    End of Session Equipment Utilized During Treatment: Gait belt;Oxygen;Other (comment) (3L O2) Activity Tolerance: Patient limited by  fatigue;Patient tolerated treatment well Patient left: in chair;with call bell/phone within reach;with chair alarm set Nurse Communication: Mobility status PT Visit Diagnosis: Unsteadiness on feet (R26.81);Muscle weakness (generalized) (M62.81);History of falling (Z91.81);Other abnormalities of gait and mobility (R26.89)     Time: 4742-5956 PT Time Calculation (min) (ACUTE ONLY): 32 min  Charges:                        Vale Haven, SPT   Vale Haven 05/17/2020, 4:23 PM

## 2020-05-17 NOTE — Care Management Important Message (Signed)
Important Message  Patient Details  Name: Stephanie Collins MRN: [id number] Date of Birth: July 30, 1932   Medicare Important Message Given:  Yes  Initial Medicare IM given by Patient Access Associate on 05/16/2020 at 10:54am.   Dannette Barbara 05/17/2020, 9:15 AM

## 2020-05-17 NOTE — TOC Initial Note (Signed)
Transition of Care [facility]) - Initial/Assessment Note    Patient Details  Name: Stephanie Collins MRN: [id number] Date of Birth: Jun 08, 1932  Transition of [facility]) CM/SW Contact:    Beverly Sessions, RN Phone Number: 05/17/2020, 10:05 AM  Clinical Narrative:                 Patient admitted with PNA.   Assessment completed with daughter. Patient currently in pain and A&O x2    Open with [place].  Corene Cornea with [facility] aware of admission.    PPC Edwina Barth - family transports to appointments Denies issues obtaining medications  Patient has RW, cane, rollator, scale in the home.   PT has assessed patient and recommends SNF.  Daughter in agreement and first choice is [facility]  [facility] has offered a bed, and daughter accepted.     Expected Discharge Plan: Skilled Nursing Facility Barriers to Discharge: Continued Medical Work up   Patient Goals and CMS Choice        Expected Discharge Plan and Services Expected Discharge Plan: [place] arrangements for the past 2 months: Single Family Home                                      Prior Living Arrangements/Services Living arrangements for the past 2 months: Single Family Home Lives with:: Self Patient language and need for interpreter reviewed:: Yes        Need for Family Participation in Patient Care: Yes (Comment) Care giver support system in place?: Yes (comment) Current home services: DME, Home RN, Home PT Criminal Activity/Legal Involvement Pertinent to Current Situation/Hospitalization: No - Comment as needed  Activities of Daily Living Home Assistive Devices/Equipment: Walker (specify type) ADL Screening (condition at time of admission) Patient's cognitive ability adequate to safely complete daily activities?: Yes Is the patient deaf or have difficulty hearing?: No Does the patient have difficulty seeing, even when wearing glasses/contacts?: No Does  the patient have difficulty concentrating, remembering, or making decisions?: No Patient able to express need for assistance with ADLs?: Yes Does the patient have difficulty dressing or bathing?: No Independently performs ADLs?: No Communication: Independent Dressing (OT): Needs assistance Is this a change from baseline?: Pre-admission baseline Grooming: Needs assistance Is this a change from baseline?: Pre-admission baseline Feeding: Independent Bathing: Needs assistance Is this a change from baseline?: Pre-admission baseline Toileting: Needs assistance Is this a change from baseline?: Pre-admission baseline In/Out Bed: Needs assistance Is this a change from baseline?: Pre-admission baseline Walks in Home: Needs assistance Is this a change from baseline?: Pre-admission baseline Does the patient have difficulty walking or climbing stairs?: Yes Weakness of Legs: Both Weakness of Arms/Hands: None  Permission Sought/Granted                  Emotional Assessment       Orientation: : Oriented to Self, Oriented to Situation   Psych Involvement: No (comment)  Admission diagnosis:  HCAP (healthcare-associated pneumonia) [J18.9] Sepsis, due to unspecified organism, unspecified whether acute organ dysfunction present [facility]) [A41.9] Patient Active Problem List   Diagnosis Date Noted  . HCAP (healthcare-associated pneumonia) 05/11/2020  . Sepsis ([place]) 05/16/2020  . Chronic diastolic CHF (congestive heart failure) ([place]) 04/30/2020  . Acute renal failure superimposed on stage 3a chronic kidney disease ([place]) 05/01/2020  . UTI (urinary tract infection) 05/09/2020  .  Elevated troponin 05/11/2020  . New onset atrial fibrillation ([place]) 04/27/2020  . Acute metabolic encephalopathy 62/26/3335  . HTN (hypertension) 05/18/2020  . Protein-calorie malnutrition, severe 05/02/2020  . CHF (congestive heart failure) ([place]) 04/30/2020  . Acute exacerbation of CHF (congestive heart failure) ([place])  04/29/2020  . Hyponatremia 04/29/2020  . CKD (chronic kidney disease), stage III 04/29/2020  . Acute on chronic respiratory failure with hypoxia ([place]) 03/25/2020  . Acute CHF (congestive heart failure) ([place]) 03/22/2020  . Leg edema   . Cough 01/19/2016  . Malignant neoplasm of lower lobe, right bronchus or lung (CODE) 01/19/2016  . Chronic obstructive pulmonary disease ([place]) 07/28/2015  . H/O malignant neoplasm 07/28/2015  . BP (high blood pressure) 07/28/2015  . Arthritis, degenerative 07/28/2015  . OP (osteoporosis) 07/28/2015  . Abnormal loss of weight 04/26/2015  . 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

## 2020-05-17 NOTE — Progress Notes (Signed)
Mobility Specialist - Progress Note   05/17/20 1500  Mobility  Activity  (Chair exercises)  Range of Motion/Exercises Right leg;Left leg (Ankle pumps, hip iso, heel slides, SLR, hip add/abd)  Level of Assistance Standby assist, set-up cues, supervision of patient - no hands on  Distance Ambulated (ft) 0 ft  Mobility Response Tolerated well  Mobility performed by Mobility specialist  $Mobility charge 1 Mobility    Pre-mobility: 97 HR, 96% SpO2 Post-mobility: 95 HR, 94% SpO2   Pt was sitting in recliner upon arrival as OT was finishing up session. Pt agreed to chair exercises. Pt was able to perform heel slides, ankle pumps, hip isometrics, hip add/abd, and straight leg raises (10x/leg) with no physical assistance. VC were needed to remind pt to use slow and controlled movements for safety and strength enhancing performance. Pt was motivated to further mobility, however, PT entered room. Mobility concluded session for PT eval. Overall, pt tolerated session well. Pt was left in recliner with all needs in reach.    Kathee Delton Mobility Specialist 05/17/20, 3:21 PM

## 2020-05-18 DIAGNOSIS — J189 Pneumonia, unspecified organism: Secondary | ICD-10-CM | POA: Diagnosis not present

## 2020-05-18 LAB — BASIC METABOLIC PANEL
Anion gap: 9 (ref 5–15)
BUN: 50 mg/dL — ABNORMAL HIGH (ref 8–23)
CO2: 26 mmol/L (ref 22–32)
Calcium: 8.3 mg/dL — ABNORMAL LOW (ref 8.9–10.3)
Chloride: 96 mmol/L — ABNORMAL LOW (ref 98–111)
Creatinine, Ser: 1.16 mg/dL — ABNORMAL HIGH (ref 0.44–1.00)
GFR calc Af Amer: 49 mL/min — ABNORMAL LOW (ref >60)
GFR calc non Af Amer: 43 mL/min — ABNORMAL LOW (ref >60)
Glucose, Bld: 96 mg/dL (ref 70–99)
Potassium: 4.1 mmol/L (ref 3.5–5.1)
Sodium: 131 mmol/L — ABNORMAL LOW (ref 135–145)

## 2020-05-18 LAB — CBC
HCT: 30.4 % — ABNORMAL LOW (ref 36.0–46.0)
Hemoglobin: 10.7 g/dL — ABNORMAL LOW (ref 12.0–15.0)
MCH: 29.9 pg (ref 26.0–34.0)
MCHC: 35.2 g/dL (ref 30.0–36.0)
MCV: 84.9 fL (ref 80.0–100.0)
Platelets: 323 10*3/uL (ref 150–400)
RBC: 3.58 MIL/uL — ABNORMAL LOW (ref 3.87–5.11)
RDW: 17.1 % — ABNORMAL HIGH (ref 11.5–15.5)
WBC: 13.9 10*3/uL — ABNORMAL HIGH (ref 4.0–10.5)
nRBC: 0 % (ref 0.0–0.2)

## 2020-05-18 MED ORDER — GUAIFENESIN ER 600 MG PO TB12
600.0000 mg | ORAL_TABLET | Freq: Two times a day (BID) | ORAL | Status: DC
  Administered 2020-05-18 – 2020-05-24 (×12): 600 mg via ORAL
  Filled 2020-05-18 (×12): qty 1

## 2020-05-18 MED ORDER — ENSURE ENLIVE PO LIQD
237.0000 mL | Freq: Two times a day (BID) | ORAL | Status: DC
  Administered 2020-05-18 – 2020-05-24 (×11): 237 mL via ORAL

## 2020-05-18 NOTE — Consult Note (Signed)
Pharmacy Antibiotic Note  Stephanie Collins is a 86 y.o. female with medical history including COPD, lung cancer, dCHF, CKD stage III admitted on 05/13/2020 with pneumonia and a positive MRSA swab, also with discoloration of both feet.  Pharmacy was consulted for cefepime and vancomycin dosing. Her renal function has improved since admission and appears to be back to her baseline level. This is day # 5 of broad-spectrum IV antibiotics  Plan:  1) continue cefepime 2 grams IV every 24 hours  2) continue vancomycin 750 mg IV every 48 hours  Ke: 0.021 h-1, T1/2: 32.4 h  Daily Scr while on vancomycin to assess renal function  Vancomycin levels as clinically indicated    Height: 5' (152.4 cm) Weight: 42.2 kg (93 lb) IBW/kg (Calculated) : 45.5  Temp (24hrs), Avg:98.1 F (36.7 C), Min:97.7 F (36.5 C), Max:98.5 F (36.9 C)  Recent Labs  Lab 05/05/2020 1232 04/27/2020 1406 05/07/2020 2029 05/15/20 0248 05/15/20 1431 05/16/20 1055 05/17/20 0550 05/18/20 0358  WBC 22.7*  --   --   --   --  28.2* 18.0* 13.9*  CREATININE 1.51*  --    < > 1.31* 1.27* 1.42* 1.13* 1.16*  LATICACIDVEN  --  1.3  --   --   --   --   --   --    < > = values in this interval not displayed.    Estimated Creatinine Clearance: 23.2 mL/min (A) (by C-G formula based on SCr of 1.16 mg/dL (H)).    Antimicrobials this admission: Ceftriaxone 9/25 x 1 in the ED Cefepime 9/25 >>  Vancomycin 9/25 >>   Microbiology results: 9/25 BCx: NG x 4 days 9/25 UCx: E coli, P mirabilis (sensitivities pending) 9/25 MRSA PCR: positive 9/25 Respiratory panel (Flu A&B, Covid): negative  Thank you for allowing pharmacy to be a part of this patient's care.  Dallie Piles 05/18/2020 7:18 AM

## 2020-05-18 NOTE — Progress Notes (Signed)
Physical Therapy Treatment Patient Details Name: Stephanie Collins MRN: [id number] DOB: Sep 24, 1931 Today's Date: 05/18/2020    History of Present Illness Stephanie Collins is an 86 y/o female who was admitted with complaints of SOB, diarrhea, discoloration of bilateral feet, increased urinary frequency, confusion, and generalized weakness. PMH includes HTN, COPD on 2L O2, lung cancer, dCHF, CKD stage III, and hyponatrimia.    PT Comments    Pt seated in recliner chair upon arrival to room and noted to have long expectorant coughing sessions. Pt began LE therex then reported having difficulty breathing. RN called then RT arrived shortly afterward and performed breathing treatment. PT session resumed afterwards with pt reporting slight improvement in breathing. Pt very anxious throughout treatment and required emotional support to try to assist in calming pt. Pt performed BLE and BUE therex for promotion of muscle strengthening and joint maintenance. Verbal cues and demonstration provided for correct technique. Pt limited this session by anxiety. Will continue to follow and progress as tolerated.     Follow Up Recommendations  SNF;Supervision for mobility/OOB     Equipment Recommendations  None recommended by PT;Other (comment) (pt has equipment)    Recommendations for Other Services       Precautions / Restrictions Precautions Precautions: Fall Precaution Comments: Watch O2 Restrictions Weight Bearing Restrictions: No    Mobility  Bed Mobility Overal bed mobility: Modified Independent             General bed mobility comments: not performed - pt seated in recliner chair  Transfers Overall transfer level: Needs assistance Equipment used: Rolling walker (2 wheeled) Transfers: Sit to/from Stand Sit to Stand: Min assist Stand pivot transfers: Min assist       General transfer comment: not performed secondary to pt having difficulty catching her breath and feeling very  anxious  Ambulation/Gait             General Gait Details: not performed   Stairs             Wheelchair Mobility    Modified Rankin (Stroke Patients Only)       Balance Overall balance assessment: Needs assistance Sitting-balance support: Feet supported Sitting balance-Leahy Scale: Good Sitting balance - Comments: pt pulled herself forward and away from back of chair using armrests; good balance with BUE support   Standing balance support: Bilateral upper extremity supported Standing balance-Leahy Scale: Fair Standing balance comment: not performed                            Cognition Arousal/Alertness: Awake/alert Behavior During Therapy: Anxious Overall Cognitive Status: Within Functional Limits for tasks assessed                         Following Commands: Follows one step commands consistently       General Comments: Pt continually stating "what am I supposed to do?" Pt very anxious throughout session secondary to coughing and difficulty catching her breath.      Exercises Other Exercises Other Exercises: sup<sit; sit<>stand x4; sitting/standing balance/tolerance Other Exercises: in sitting: resisted toe presses, resisted dorsiflexion, leg presses, air punches, scapular retraction, and simultaneous hip ab/ad x 15 reps each; air punches x 10 bilaterally    General Comments        Pertinent Vitals/Pain Pain Assessment: No/denies pain Pain Score: 6  Pain Location: back Pain Intervention(s): Limited activity within patient's tolerance;Repositioned;Monitored during session  Home Living Family/patient expects to be discharged to:: Private residence Living Arrangements: Alone Available Help at Discharge: Family;Available PRN/intermittently Type of Home: House Home Access: Stairs to enter Entrance Stairs-Rails: Can reach both Home Layout: One level Home Equipment: Walker - 2 wheels;Walker - 4 wheels;Cane - single  point Additional Comments: DTR reports granddaughters also available to help but pt has previously refused (does not want to impose)     Prior Function Level of Independence: Needs assistance  Gait / Transfers Assistance Needed: Dtr is present in room t/o session, reports pt performs fxl mobility at MOD I with RW vs SPC (alternates) in home. Pt on 2-2.5Lnc home oxygen (turn up to 2.5 with activity) at baseline. ADL's / Homemaking Assistance Needed: Dtr reports that she helps with the cooking/cleaning and med mgt as well as transportation. States that pt is able to bathe herself-takes sink baths and can dress herself, but primarily relies on slip on shoes d/t difficulty with bending to tie shoes with laces. States that pt will do her own laundry although it is a struggle. Comments: dtr reports she has started noticing some decreased cognition in her mother during afternoon/night time.   PT Goals (current goals can now be found in the care plan section) Acute Rehab PT Goals Patient Stated Goal: to go home PT Goal Formulation: With patient Time For Goal Achievement: 05/30/20 Potential to Achieve Goals: Good    Frequency    Min 2X/week      PT Plan      Co-evaluation              AM-PAC PT "6 Clicks" Mobility   Outcome Measure  Help needed turning from your back to your side while in a flat bed without using bedrails?: A Little Help needed moving from lying on your back to sitting on the side of a flat bed without using bedrails?: A Little Help needed moving to and from a bed to a chair (including a wheelchair)?: A Little Help needed standing up from a chair using your arms (e.g., wheelchair or bedside chair)?: A Little Help needed to walk in hospital room?: A Little Help needed climbing 3-5 steps with a railing? : A Lot 6 Click Score: 17    End of Session Equipment Utilized During Treatment: Oxygen Activity Tolerance: Patient limited by fatigue Patient left: in chair;with  call bell/phone within reach;with chair alarm set Nurse Communication: Mobility status PT Visit Diagnosis: Unsteadiness on feet (R26.81);Muscle weakness (generalized) (M62.81);History of falling (Z91.81);Other abnormalities of gait and mobility (R26.89)     Time: 0867-6195 Timed in-person treatment session equalled 26 minutes PT Time Calculation (min) (ACUTE ONLY): 36 min  Charges:                        Vale Haven, SPT   Vale Haven 05/18/2020, 4:09 PM

## 2020-05-18 NOTE — Progress Notes (Signed)
Mobility Specialist - Progress Note   05/18/20 1312  Mobility  Activity Ambulated in room (+ chair exercises)  Level of Assistance Contact guard assist, steadying assist (min. assist S2S)  Assistive Device Front wheel walker  Distance Ambulated (ft) 12 ft  Mobility Response Tolerated well  Mobility performed by Mobility specialist  $Mobility charge 1 Mobility    Pre-mobility: 102 HR, 104/58 BP, 95% SpO2 Post-mobility: 106 HR, 99/58 BP, 92% SpO2   Pt sitting on recliner upon arrival.  Pt currently utilizing 3L O2 [place]. Pt agreed to session. Pt needed min. Assist S2S. Pt ambulated 2' forwards and back from chair, and then progressed to 4' forwards and back. Pt needed CGA during ambulation for steadying. O2 desat to 82% at the end of this exercise. Pt educated on managing SOB and O2 by utilizing PLB. Pt practiced PLB t/o the rest of the session. O2 sat up to 92% immediately after sitting on recliner. Pt performed seated exercises: seated calf raises x 10, seated kicks x 10, and seated marches x 10. Overall, pt tolerated session well. Pt had no c/o pain. Pt remained in recliner w/ alarm set. Call bell and phone placed in reach. Nurse was notified.     Doralyn Kirkes Mobility Specialist  05/18/20, 1:23 PM

## 2020-05-18 NOTE — Progress Notes (Addendum)
PROGRESS NOTE    Stephanie Collins  RDE:[id number] DOB: 06-Aug-1932 DOA: 05/18/2020 PCP: Baxter Hire, MD    Assessment & Plan:   Principal Problem:   HCAP (healthcare-associated pneumonia) Active Problems:   Chronic obstructive pulmonary disease ([place])   Hyponatremia   Sepsis ([place])   Chronic diastolic CHF (congestive heart failure) ([place])   Acute renal failure superimposed on stage 3a chronic kidney disease ([place])   UTI (urinary tract infection)   Elevated troponin   New onset atrial fibrillation ([place])   Acute metabolic encephalopathy   HTN (hypertension)   Sepsis: secondary to UTI & CAP.  Met criteria w/ leukocytosis, tachycardia, tachypnea.  --continue vanc/cefepime  CAP: --on home 4L O2.  Reported having difficulty bringing up sputum --Strept is neg PLAN: --continue vanc/cefe --DuoNeb scheduled --Scheduled chest PT, flutter valve, IS  UTI: urine cx is growing e.coli, proteus, sens pending.  --already on cefepime for CAP  COPD Chronic hypoxic respiratory failure on 4L O2 baseline --Continue on DuoNeb  HTN:  --continue to hold home coreg and ACEi due to low BP  Hyponatremia: improving.  Will continue to monitor   Chronic diastolic CHF:  echo 6/31/4970 showed EF of 55 to 60% with grade II diastolic dysfunction.    AKI on CKDIIIa:  Cr is trending down   A. fib: new onset.  D/c IV heparin drip. Not on anticoagulation secondary to fall risk. Continue on tele. Cardio following and recs apprec   elevated troponins, likely secondary to demand ischemia & a. fib.   Acute metabolic encephalopathy:  likely multifactorial, including CAP, UTI & hypoxia. Re-orient prn   Discoloration of bilateral feet:  no ischemia as per vascular surg   DVT prophylaxis: Lovenox SQ Code Status: Full code  Family Communication: updated daughter at the bedside Status is: inpatient Dispo:   The patient is from: home Anticipated d/c is to: SNF Anticipated d/c date is: 2-3  days Patient currently is not medically stable to d/c due to: still on IV abx for PNA, still having dyspnea and rhonchi due to mucus and difficulty clearing, need chest PT   Consultants:   Cardio  vascular   Procedures:    Antimicrobials: vanco, cefepime    Subjective: Pt reported having sputum that she was having a hard time coughing up.  On 4L O2 which is baseline.    Objective: Vitals:   05/18/20 0903 05/18/20 1152 05/18/20 1255 05/18/20 1419  BP:  100/68    Pulse:  89    Resp:  16    Temp:  98.7 F (37.1 C)    TempSrc:  Oral    SpO2: 95% 99% 94% 95%  Weight:      Height:        Intake/Output Summary (Last 24 hours) at 05/18/2020 1904 Last data filed at 05/18/2020 1006 Gross per 24 hour  Intake 540 ml  Output 800 ml  Net -260 ml   Filed Weights   05/03/2020 1259 04/26/2020 1523  Weight: 43.5 kg 42.2 kg    Examination:  Constitutional: NAD, AAOx3, ill appearing HEENT: conjunctivae and lids normal, EOMI CV: RRR no M,R,G. Distal pulses +2.  No cyanosis.   RESP: diffuse loud rhonchi, on 4L GI: +BS, NTND SKIN: warm, dry and intact Neuro: II - XII grossly intact.  Sensation intact Psych: Depressed mood and affect.      Data Reviewed: I have personally reviewed following labs and imaging studies  CBC: Recent Labs  Lab 04/21/2020 1232 05/16/20 1055 05/17/20  0550 05/18/20 0358  WBC 22.7* 28.2* 18.0* 13.9*  HGB 12.3 11.3* 10.9* 10.7*  HCT 35.7* 32.1* 30.3* 30.4*  MCV 79.5* 79.7* 84.6 84.9  PLT 372 359 333 505   Basic Metabolic Panel: Recent Labs  Lab 05/15/20 0248 05/15/20 1431 05/16/20 1055 05/17/20 0550 05/18/20 0358  NA 130* 130* 129* 131* 131*  K 4.3 4.7 4.1 4.0 4.1  CL 96* 93* 94* 98 96*  CO2 $Re'23 27 23 24 26  'Ysy$ GLUCOSE 90 119* 127* 95 96  BUN 54* 50* 50* 48* 50*  CREATININE 1.31* 1.27* 1.42* 1.13* 1.16*  CALCIUM 8.1* 8.6* 8.2* 8.2* 8.3*   GFR: Estimated Creatinine Clearance: 23.2 mL/min (A) (by C-G formula based on SCr of 1.16 mg/dL  (H)). Liver Function Tests: Recent Labs  Lab 04/20/2020 1232  AST 19  ALT 15  ALKPHOS 113  BILITOT 0.9  PROT 6.5  ALBUMIN 2.7*   No results for input(s): LIPASE, AMYLASE in the last 168 hours. No results for input(s): AMMONIA in the last 168 hours. Coagulation Profile: Recent Labs  Lab 05/06/2020 1630  INR 1.1   Cardiac Enzymes: No results for input(s): CKTOTAL, CKMB, CKMBINDEX, TROPONINI in the last 168 hours. BNP (last 3 results) No results for input(s): PROBNP in the last 8760 hours. HbA1C: No results for input(s): HGBA1C in the last 72 hours. CBG: No results for input(s): GLUCAP in the last 168 hours. Lipid Profile: No results for input(s): CHOL, HDL, LDLCALC, TRIG, CHOLHDL, LDLDIRECT in the last 72 hours. Thyroid Function Tests: No results for input(s): TSH, T4TOTAL, FREET4, T3FREE, THYROIDAB in the last 72 hours. Anemia Panel: No results for input(s): VITAMINB12, FOLATE, FERRITIN, TIBC, IRON, RETICCTPCT in the last 72 hours. Sepsis Labs: Recent Labs  Lab 05/17/2020 1406 04/20/2020 1630  PROCALCITON  --  0.23  LATICACIDVEN 1.3  --     Recent Results (from the past 240 hour(s))  Urine Culture     Status: Abnormal (Preliminary result)   Collection Time: 04/25/2020 12:32 PM   Specimen: Urine, Random  Result Value Ref Range Status   Specimen Description   Final    URINE, RANDOM Performed at [facility], [address] [place], [place] [address]    Special Requests   Final    NONE Performed at [facility], [address] [place], [place] [address]    Culture (A)  Final    >=100,000 COLONIES/mL ESCHERICHIA COLI >=100,000 COLONIES/mL PROTEUS MIRABILIS SUSCEPTIBILITIES TO FOLLOW Performed at [facility], [place] [address] [place], [place] [address]    Report Status PENDING  Incomplete  Blood culture (routine x 2)     Status: None (Preliminary result)   Collection Time: 05/05/2020  2:06 PM   Specimen: BLOOD  Result Value Ref Range Status    Specimen Description BLOOD LEFT FORE ARM  Final   Special Requests   Final    BOTTLES DRAWN AEROBIC AND ANAEROBIC Blood Culture adequate volume   Culture   Final    NO GROWTH 4 DAYS Performed at [facility], [address] [place], [place] [address]    Report Status PENDING  Incomplete  Blood culture (routine x 2)     Status: None (Preliminary result)   Collection Time: 04/26/2020  2:06 PM   Specimen: BLOOD  Result Value Ref Range Status   Specimen Description BLOOD RIGHT [facility]  Final   Special Requests   Final    BOTTLES DRAWN AEROBIC AND ANAEROBIC Blood Culture results may not  be optimal due to an excessive volume of blood received in culture bottles   Culture   Final    NO GROWTH 4 DAYS Performed at [facility], [address] [place], [place] [address]    Report Status PENDING  Incomplete  Respiratory Panel by RT PCR (Flu A&B, Covid) - Nasopharyngeal Swab     Status: None   Collection Time: 05/13/2020  2:06 PM   Specimen: Nasopharyngeal Swab  Result Value Ref Range Status   SARS Coronavirus 2 by RT PCR NEGATIVE NEGATIVE Final    Comment: (NOTE) SARS-CoV-2 target nucleic acids are NOT DETECTED.  The SARS-CoV-2 RNA is generally detectable in upper respiratoy specimens during the acute phase of infection. The lowest concentration of SARS-CoV-2 viral copies this assay can detect is 131 copies/mL. A negative result does not preclude SARS-Cov-2 infection and should not be used as the sole basis for treatment or other patient management decisions. A negative result may occur with  improper specimen collection/handling, submission of specimen other than nasopharyngeal swab, presence of viral mutation(s) within the areas targeted by this assay, and inadequate number of viral copies (<131 copies/mL). A negative result must be combined with clinical observations, patient history, and epidemiological information. The expected result is Negative.  Fact Sheet for  Patients:  PinkCheek.be  Fact Sheet for Healthcare Providers:  GravelBags.it  This test is no t yet approved or cleared by the [place] FDA and  has been authorized for detection and/or diagnosis of SARS-CoV-2 by FDA under an Emergency Use Authorization (EUA). This EUA will remain  in effect (meaning this test can be used) for the duration of the COVID-19 declaration under Section 564(b)(1) of the Act, 21 U.S.C. section 360bbb-3(b)(1), unless the authorization is terminated or revoked sooner.     Influenza A by PCR NEGATIVE NEGATIVE Final   Influenza B by PCR NEGATIVE NEGATIVE Final    Comment: (NOTE) The Xpert Xpress SARS-CoV-2/FLU/RSV assay is intended as an aid in  the diagnosis of influenza from Nasopharyngeal swab specimens and  should not be used as a sole basis for treatment. Nasal washings and  aspirates are unacceptable for Xpert Xpress SARS-CoV-2/FLU/RSV  testing.  Fact Sheet for Patients: PinkCheek.be  Fact Sheet for Healthcare Providers: GravelBags.it  This test is not yet approved or cleared by the [place] FDA and  has been authorized for detection and/or diagnosis of SARS-CoV-2 by  FDA under an Emergency Use Authorization (EUA). This EUA will remain  in effect (meaning this test can be used) for the duration of the  Covid-19 declaration under Section 564(b)(1) of the Act, 21  U.S.C. section 360bbb-3(b)(1), unless the authorization is  terminated or revoked. Performed at [facility], [address] [place], [place] [address]   MRSA PCR Screening     Status: Abnormal   Collection Time: 05/13/2020  8:14 PM   Specimen: Nasopharyngeal  Result Value Ref Range Status   MRSA by PCR POSITIVE (A) NEGATIVE Final    Comment:        The GeneXpert MRSA Assay (FDA approved for NASAL specimens only), is one component of a comprehensive  MRSA colonization surveillance program. It is not intended to diagnose MRSA infection nor to guide or monitor treatment for MRSA infections. CRITICAL RESULT CALLED TO, READ BACK BY AND VERIFIED WITH: MEGAN GRAF $[email or web address]$  05/04/2020 TTG Performed at [facility], [address], [place], [place] [address]          Radiology Studies: No results  found.      Scheduled Meds: . acetylcysteine  3 mL Nebulization BID  . aspirin EC  81 mg Oral Daily  . atorvastatin  40 mg Oral Daily  . Chlorhexidine Gluconate Cloth  6 each Topical Q0600  . cholecalciferol  2,000 Units Oral Daily  . enoxaparin (LOVENOX) injection  30 mg Subcutaneous Q24H  . feeding supplement (ENSURE ENLIVE)  237 mL Oral BID BM  . guaiFENesin  600 mg Oral BID  . ipratropium-albuterol  3 mL Nebulization TID  . mouth rinse  15 mL Mouth Rinse BID  . mupirocin ointment  1 application Nasal BID   Continuous Infusions: . ceFEPime (MAXIPIME) IV 2 g (05/17/20 2106)  . vancomycin 750 mg (05/18/20 1539)     LOS: 4 days    Enzo Bi, MD [facility] Pager [phone]  If 7PM-7AM, please contact night-coverage [email or web address] 05/18/2020, 7:04 PM

## 2020-05-18 NOTE — Progress Notes (Signed)
Occupational Therapy Treatment Patient Details Name: Stephanie Collins MRN: [id number] DOB: [birth year] Today's Date: 05/18/2020    History of present illness Stephanie Collins is an 86 y/o female who was admitted with complaints of SOB, diarrhea, discoloration of bilateral feet, increased urinary frequency, confusion, and generalized weakness. PMH includes HTN, COPD on 2L O2, lung cancer, dCHF, CKD stage III, and hyponatrimia.   OT comments  Pt seen for OT treatment on this date. Upon arrival to room pt awake and alert, seated upright in bed, daughter present in room. Pt reports her "usual" back pain, describes it as 6/10. Pt agreeable to tx. Pt instructed in bed mobility, supine<sit transfer with SUPV but frequent VCs to continue task. Worked on EOB sitting balance -- pt tends to sit with a very kyphotic posture but able to sit upright with VC and then can maintain that posture with feet supported but no UE support. Stands with RW and Min A required both for transitioning from seated to standing and for maintaining posture. Pt became slightly agitated in standing, saying "What am I supposed to do?" Pt pivots to recliner with Min A and VCs for foot placement after every step. Pt left in recliner with chair alarm activated, call bell within reach, and daughter in room. Pt states that it was her goal to return to her home, but she realizes that she needs to go to a SNF at present.  Pt continues to benefit from skilled OT services to maximize return to PLOF and minimize risk of falls, injury, and decline in functional mobility. Will continue to follow POC. Discharge recommendation remains appropriate.    Follow Up Recommendations  SNF    Equipment Recommendations  None recommended by OT    Recommendations for Other Services      Precautions / Restrictions Precautions Precautions: Fall Precaution Comments: Watch O2 Restrictions Weight Bearing Restrictions: No       Mobility Bed Mobility Overal bed  mobility: Modified Independent             General bed mobility comments: pt able to move supine<sit w/o physical assistance, but required extended time and repeated VCs  Transfers Overall transfer level: Needs assistance Equipment used: Rolling walker (2 wheeled) Transfers: Sit to/from Stand Sit to Stand: Min assist Stand pivot transfers: Min assist       General transfer comment: Min A for sit <> stand transfer for boosting to stand, balance.    Balance Overall balance assessment: Needs assistance Sitting-balance support: Feet supported Sitting balance-Leahy Scale: Good Sitting balance - Comments: no LOB, but very stooped posture sitting EOB, able to right self when given VC   Standing balance support: Bilateral upper extremity supported Standing balance-Leahy Scale: Fair                             ADL either performed or assessed with clinical judgement   ADL Overall ADL's : Needs assistance/impaired     Grooming: Brushing hair;Set up;Supervision/safety                                 General ADL Comments: CGA + RW for ADL t/f     Vision Patient Visual Report: No change from baseline     Perception     Praxis      Cognition Arousal/Alertness: Awake/alert Behavior During Therapy: WFL for tasks assessed/performed Overall Cognitive Status: Within  Functional Limits for tasks assessed                         Following Commands: Follows one step commands consistently                Exercises Other Exercises Other Exercises: sup<sit; sit<>stand x4; sitting/standing balance/tolerance   Shoulder Instructions       General Comments      Pertinent Vitals/ Pain       Pain Assessment: 0-10 Pain Score: 6  Pain Location: back Pain Intervention(s): Limited activity within patient's tolerance;Repositioned;Monitored during session  [place]ie expects to be discharged to:: Private residence Living  Arrangements: Alone Available Help at Discharge: Family;Available PRN/intermittently Type of Home: House Home Access: Stairs to enter [employer] of Steps: 4 stairs in the front, 2 stairs in the back Entrance Stairs-Rails: Can reach both Home Layout: One level     Bathroom Shower/Tub: Teacher, early years/pre: Handicapped height     Home Equipment: Environmental consultant - 2 wheels;Walker - 4 wheels;Cane - single point   Additional Comments: DTR reports granddaughters also available to help but pt has previously refused (does not want to impose)       Prior Functioning/Environment Level of Independence: Needs assistance  Gait / Transfers Assistance Needed: Dtr is present in room t/o session, reports pt performs fxl mobility at MOD I with RW vs SPC (alternates) in home. Pt on 2-2.5Lnc home oxygen (turn up to 2.5 with activity) at baseline. ADL's / Homemaking Assistance Needed: Dtr reports that she helps with the cooking/cleaning and med mgt as well as transportation. States that pt is able to bathe herself-takes sink baths and can dress herself, but primarily relies on slip on shoes d/t difficulty with bending to tie shoes with laces. States that pt will do her own laundry although it is a struggle.   Comments: dtr reports she has started noticing some decreased cognition in her mother during afternoon/night time.   Frequency  Min 1X/week        Progress Toward Goals  OT Goals(current goals can now be found in the care plan section)     Acute Rehab OT Goals Patient Stated Goal: to go home OT Goal Formulation: With patient/family Time For Goal Achievement: 05/30/20 Potential to Achieve Goals: Fair  Plan      Co-evaluation                 AM-PAC OT "6 Clicks" Daily Activity     Outcome Measure   Help from another person eating meals?: None Help from another person taking care of personal grooming?: A Little Help from another person toileting, which  includes using toliet, bedpan, or urinal?: A Little Help from another person bathing (including washing, rinsing, drying)?: A Lot Help from another person to put on and taking off regular upper body clothing?: A Little Help from another person to put on and taking off regular lower body clothing?: A Lot 6 Click Score: 17    End of Session Equipment Utilized During Treatment: Gait belt;Rolling walker;Oxygen  OT Visit Diagnosis: Other abnormalities of gait and mobility (R26.89);Unsteadiness on feet (R26.81);Muscle weakness (generalized) (M62.81)   Activity Tolerance Patient tolerated treatment well   Patient Left in chair;with call bell/phone within reach;with chair alarm set;with family/visitor present   Nurse Communication          Time: 1540-0867 OT Time Calculation (min): 24 min  Charges: OT Treatments $Self  Care/Home Management : 23-37 mins  Josiah Lobo, PhD, [place], OTR/L ascom [phone] 05/18/20, 1:13 PM

## 2020-05-19 DIAGNOSIS — J189 Pneumonia, unspecified organism: Secondary | ICD-10-CM | POA: Diagnosis not present

## 2020-05-19 LAB — CBC
HCT: 32 % — ABNORMAL LOW (ref 36.0–46.0)
Hemoglobin: 11.1 g/dL — ABNORMAL LOW (ref 12.0–15.0)
MCH: 28 pg (ref 26.0–34.0)
MCHC: 34.7 g/dL (ref 30.0–36.0)
MCV: 80.8 fL (ref 80.0–100.0)
Platelets: 324 10*3/uL (ref 150–400)
RBC: 3.96 MIL/uL (ref 3.87–5.11)
RDW: 15.7 % — ABNORMAL HIGH (ref 11.5–15.5)
WBC: 11.9 10*3/uL — ABNORMAL HIGH (ref 4.0–10.5)
nRBC: 0 % (ref 0.0–0.2)

## 2020-05-19 LAB — URINE CULTURE: Culture: >=100000 — AB

## 2020-05-19 LAB — CULTURE, BLOOD (ROUTINE X 2)
Culture: NO GROWTH
Culture: NO GROWTH
Special Requests: ADEQUATE

## 2020-05-19 LAB — BASIC METABOLIC PANEL
Anion gap: 9 (ref 5–15)
BUN: 41 mg/dL — ABNORMAL HIGH (ref 8–23)
CO2: 24 mmol/L (ref 22–32)
Calcium: 8.5 mg/dL — ABNORMAL LOW (ref 8.9–10.3)
Chloride: 98 mmol/L (ref 98–111)
Creatinine, Ser: 1.03 mg/dL — ABNORMAL HIGH (ref 0.44–1.00)
GFR calc Af Amer: 57 mL/min — ABNORMAL LOW (ref >60)
GFR calc non Af Amer: 49 mL/min — ABNORMAL LOW (ref >60)
Glucose, Bld: 93 mg/dL (ref 70–99)
Potassium: 4.1 mmol/L (ref 3.5–5.1)
Sodium: 131 mmol/L — ABNORMAL LOW (ref 135–145)

## 2020-05-19 LAB — MAGNESIUM: Magnesium: 2 mg/dL (ref 1.7–2.4)

## 2020-05-19 MED ORDER — IPRATROPIUM-ALBUTEROL 0.5-2.5 (3) MG/3ML IN SOLN
3.0000 mL | Freq: Four times a day (QID) | RESPIRATORY_TRACT | Status: DC
  Administered 2020-05-19 – 2020-05-24 (×19): 3 mL via RESPIRATORY_TRACT
  Filled 2020-05-19 (×19): qty 3

## 2020-05-19 MED ORDER — METHYLPREDNISOLONE SODIUM SUCC 40 MG IJ SOLR
40.0000 mg | Freq: Two times a day (BID) | INTRAMUSCULAR | Status: DC
  Administered 2020-05-19 – 2020-05-20 (×2): 40 mg via INTRAVENOUS
  Filled 2020-05-19 (×2): qty 1

## 2020-05-19 MED ORDER — SODIUM CHLORIDE 0.9 % IV BOLUS
250.0000 mL | Freq: Once | INTRAVENOUS | Status: AC
  Administered 2020-05-19: 250 mL via INTRAVENOUS

## 2020-05-19 NOTE — Progress Notes (Addendum)
PROGRESS NOTE    Stephanie Collins  XBL:[id number] DOB: 01/04/1932 DOA: 05/09/2020 PCP: Baxter Hire, MD    Assessment & Plan:   Principal Problem:   HCAP (healthcare-associated pneumonia) Active Problems:   Chronic obstructive pulmonary disease ([place])   Hyponatremia   Sepsis ([place])   Chronic diastolic CHF (congestive heart failure) ([place])   Acute renal failure superimposed on stage 3a chronic kidney disease ([place])   UTI (urinary tract infection)   Elevated troponin   New onset atrial fibrillation ([place])   Acute metabolic encephalopathy   HTN (hypertension)   Sepsis: secondary to UTI & CAP.  Met criteria w/ leukocytosis, tachycardia, tachypnea.  --completed 5 days of vanc/cefepime  CAP: --on home 4L O2.  Reported having difficulty bringing up sputum --Strept is neg --completed 5 days of vanc/cefepime  UTI: urine cx is growing e.coli, proteus, sens pending.  --already on cefepime for CAP  Acute on chronic COPD exacerbation Chronic hypoxic respiratory failure on 4L O2 baseline --In light of persistent dyspnea despite finishing PNA treatment, and having increased brown thick sputum, pt is having acute exacerbation of COPD. PLAN: --Start solumedrol 40 mg BID --DuoNeb QID with chest PT  Hx of HTN, not currently active Hypotension --BP has been 80's-100's --continue to hold home coreg and ACEi --caution IVF boluses due to dCHF  Hyponatremia: improving.  Na 131 stable. --encourage oral hydration.  Chronic diastolic CHF:  echo 3/00/7622 showed EF of 55 to 60% with grade II diastolic dysfunction.   --on home lasix 20 mg daily, which was held on admission due to hypotension and AKI --caution IVF boluses  AKI on CKDIIIa:  Cr is trending down   A. fib: new onset.  D/c IV heparin drip. Not on anticoagulation secondary to fall risk. Continue on tele. Cardio following and recs apprec   elevated troponins, likely secondary to demand ischemia & a. fib.   Acute metabolic  encephalopathy:  likely multifactorial, including CAP, UTI & hypoxia. Re-orient prn   Discoloration of bilateral feet:  no ischemia as per vascular surg   DVT prophylaxis: Lovenox SQ Code Status: Full code  Family Communication: updated daughter at the bedside today  Status is: inpatient Dispo:   The patient is from: home Anticipated d/c is to: SNF Anticipated d/c date is: 2-3 days Patient currently is not medically stable to d/c due to: still having dyspnea and rhonchi due to mucus and difficulty clearing, need chest PT, and is now started tx as COPD exacerbation.   Consultants:   Cardio  vascular   Procedures:    Antimicrobials: vanco, cefepime    Subjective: Pt said she has been using her flutter valve, and has been more successful in bringing up sputum.  Reported being thirsty.  Drinking fluids, but not eating as much.  BP remained low.  Pt also intermittently anxious.   Objective: Vitals:   05/19/20 1247 05/19/20 1641 05/19/20 2016 05/19/20 2039  BP:    108/64  Pulse:   (!) 104 99  Resp:   19 20  Temp:    97.9 F (36.6 C)  TempSrc:    Oral  SpO2: 94% 94% 95% 96%  Weight:      Height:        Intake/Output Summary (Last 24 hours) at 05/20/2020 0335 Last data filed at 05/19/2020 1855 Gross per 24 hour  Intake 180 ml  Output 200 ml  Net -20 ml   Filed Weights   05/09/2020 1259 04/21/2020 1523  Weight: 43.5 kg  42.2 kg    Examination:  Constitutional: NAD, AAOx3, ill appearing HEENT: conjunctivae and lids normal, EOMI, mucosa dry CV: RRR no M,R,G. Distal pulses +2.  No cyanosis.   RESP: Diffuse rhonchi, though improved from yesterday.  Brown sputum.  On 4L GI: +BS, NTND Extremities: No effusions, edema in BLE SKIN: warm, dry and intact Neuro: II - XII grossly intact.  Sensation intact Psych: depressed and anxious mood and affect.    Data Reviewed: I have personally reviewed following labs and imaging studies  CBC: Recent Labs  Lab  04/22/2020 1232 05/16/20 1055 05/17/20 0550 05/18/20 0358 05/19/20 0651  WBC 22.7* 28.2* 18.0* 13.9* 11.9*  HGB 12.3 11.3* 10.9* 10.7* 11.1*  HCT 35.7* 32.1* 30.3* 30.4* 32.0*  MCV 79.5* 79.7* 84.6 84.9 80.8  PLT 372 359 333 323 956   Basic Metabolic Panel: Recent Labs  Lab 05/15/20 1431 05/16/20 1055 05/17/20 0550 05/18/20 0358 05/19/20 0651  NA 130* 129* 131* 131* 131*  K 4.7 4.1 4.0 4.1 4.1  CL 93* 94* 98 96* 98  CO2 _0 GLUCOSE 119* 127* 95 96 93  BUN 50* 50* 48* 50* 41*  CREATININE 1.27* 1.42* 1.13* 1.16* 1.03*  CALCIUM 8.6* 8.2* 8.2* 8.3* 8.5*  MG  --   --   --   --  2.0   GFR: Estimated Creatinine Clearance: 26.1 mL/min (A) (by C-G formula based on SCr of 1.03 mg/dL (H)). Liver Function Tests: Recent Labs  Lab 04/25/2020 1232  AST 19  ALT 15  ALKPHOS 113  BILITOT 0.9  PROT 6.5  ALBUMIN 2.7*   No results for input(s): LIPASE, AMYLASE in the last 168 hours. No results for input(s): AMMONIA in the last 168 hours. Coagulation Profile: Recent Labs  Lab 05/05/2020 1630  INR 1.1   Cardiac Enzymes: No results for input(s): CKTOTAL, CKMB, CKMBINDEX, TROPONINI in the last 168 hours. BNP (last 3 results) No results for input(s): PROBNP in the last 8760 hours. HbA1C: No results for input(s): HGBA1C in the last 72 hours. CBG: No results for input(s): GLUCAP in the last 168 hours. Lipid Profile: No results for input(s): CHOL, HDL, LDLCALC, TRIG, CHOLHDL, LDLDIRECT in the last 72 hours. Thyroid Function Tests: No results for input(s): TSH, T4TOTAL, FREET4, T3FREE, THYROIDAB in the last 72 hours. Anemia Panel: No results for input(s): VITAMINB12, FOLATE, FERRITIN, TIBC, IRON, RETICCTPCT in the last 72 hours. Sepsis Labs: Recent Labs  Lab 04/26/2020 1406 05/05/2020 1630  PROCALCITON  --  0.23  LATICACIDVEN 1.3  --     Recent Results (from the past 240 hour(s))  Urine Culture     Status: Abnormal   Collection Time: 04/24/2020 12:32 PM   Specimen:  Urine, Random  Result Value Ref Range Status   Specimen Description   Final    URINE, RANDOM Performed at [facility], [address] [place], [place] [address]    Special Requests   Final    NONE Performed at [facility], [address] [place], [place] [address]    Culture (A)  Final    >=100,000 COLONIES/mL ESCHERICHIA COLI >=100,000 COLONIES/mL PROTEUS MIRABILIS    Report Status 05/19/2020 FINAL  Final   Organism ID, Bacteria ESCHERICHIA COLI (A)  Final   Organism ID, Bacteria PROTEUS MIRABILIS (A)  Final      Susceptibility   Escherichia coli - MIC*    AMPICILLIN 4 SENSITIVE Sensitive     CEFAZOLIN <=4 SENSITIVE Sensitive  CEFTRIAXONE <=0.25 SENSITIVE Sensitive     CIPROFLOXACIN <=0.25 SENSITIVE Sensitive     GENTAMICIN <=1 SENSITIVE Sensitive     IMIPENEM <=0.25 SENSITIVE Sensitive     NITROFURANTOIN <=16 SENSITIVE Sensitive     TRIMETH/SULFA <=20 SENSITIVE Sensitive     AMPICILLIN/SULBACTAM <=2 SENSITIVE Sensitive     PIP/TAZO <=4 SENSITIVE Sensitive     * >=100,000 COLONIES/mL ESCHERICHIA COLI   Proteus mirabilis - MIC*    AMPICILLIN <=2 SENSITIVE Sensitive     CEFAZOLIN <=4 SENSITIVE Sensitive     CEFTRIAXONE <=0.25 SENSITIVE Sensitive     CIPROFLOXACIN <=0.25 SENSITIVE Sensitive     GENTAMICIN <=1 SENSITIVE Sensitive     IMIPENEM 1 SENSITIVE Sensitive     NITROFURANTOIN RESISTANT Resistant     TRIMETH/SULFA <=20 SENSITIVE Sensitive     AMPICILLIN/SULBACTAM <=2 SENSITIVE Sensitive     PIP/TAZO <=4 SENSITIVE Sensitive     * >=100,000 COLONIES/mL PROTEUS MIRABILIS  Blood culture (routine x 2)     Status: None   Collection Time: 04/22/2020  2:06 PM   Specimen: BLOOD  Result Value Ref Range Status   Specimen Description BLOOD LEFT FORE ARM  Final   Special Requests   Final    BOTTLES DRAWN AEROBIC AND ANAEROBIC Blood Culture adequate volume   Culture   Final    NO GROWTH 5 DAYS Performed at [facility], [address]  [place], [place] [address]    Report Status 05/19/2020 FINAL  Final  Blood culture (routine x 2)     Status: None   Collection Time: 04/24/2020  2:06 PM   Specimen: BLOOD  Result Value Ref Range Status   Specimen Description BLOOD RIGHT AC  Final   Special Requests   Final    BOTTLES DRAWN AEROBIC AND ANAEROBIC Blood Culture results may not be optimal due to an excessive volume of blood received in culture bottles   Culture   Final    NO GROWTH 5 DAYS Performed at [facility], [address] [place], [place] [address]    Report Status 05/19/2020 FINAL  Final  Respiratory Panel by RT PCR (Flu A&B, Covid) - Nasopharyngeal Swab     Status: None   Collection Time: 04/24/2020  2:06 PM   Specimen: Nasopharyngeal Swab  Result Value Ref Range Status   SARS Coronavirus 2 by RT PCR NEGATIVE NEGATIVE Final    Comment: (NOTE) SARS-CoV-2 target nucleic acids are NOT DETECTED.  The SARS-CoV-2 RNA is generally detectable in upper respiratoy specimens during the acute phase of infection. The lowest concentration of SARS-CoV-2 viral copies this assay can detect is 131 copies/mL. A negative result does not preclude SARS-Cov-2 infection and should not be used as the sole basis for treatment or other patient management decisions. A negative result may occur with  improper specimen collection/handling, submission of specimen other than nasopharyngeal swab, presence of viral mutation(s) within the areas targeted by this assay, and inadequate number of viral copies (<131 copies/mL). A negative result must be combined with clinical observations, patient history, and epidemiological information. The expected result is Negative.  Fact Sheet for Patients:  PinkCheek.be  Fact Sheet for Healthcare Providers:  GravelBags.it  This test is no t yet approved or cleared by the [place] FDA and  has been authorized for detection and/or diagnosis  of SARS-CoV-2 by FDA under an Emergency Use Authorization (EUA). This EUA will remain  in effect (meaning this test can be used) for the duration of the  COVID-19 declaration under Section 564(b)(1) of the Act, 21 U.S.C. section 360bbb-3(b)(1), unless the authorization is terminated or revoked sooner.     Influenza A by PCR NEGATIVE NEGATIVE Final   Influenza B by PCR NEGATIVE NEGATIVE Final    Comment: (NOTE) The Xpert Xpress SARS-CoV-2/FLU/RSV assay is intended as an aid in  the diagnosis of influenza from Nasopharyngeal swab specimens and  should not be used as a sole basis for treatment. Nasal washings and  aspirates are unacceptable for Xpert Xpress SARS-CoV-2/FLU/RSV  testing.  Fact Sheet for Patients: PinkCheek.be  Fact Sheet for Healthcare Providers: GravelBags.it  This test is not yet approved or cleared by the [place] FDA and  has been authorized for detection and/or diagnosis of SARS-CoV-2 by  FDA under an Emergency Use Authorization (EUA). This EUA will remain  in effect (meaning this test can be used) for the duration of the  Covid-19 declaration under Section 564(b)(1) of the Act, 21  U.S.C. section 360bbb-3(b)(1), unless the authorization is  terminated or revoked. Performed at [facility], [address] [place], [place] [address]   MRSA PCR Screening     Status: Abnormal   Collection Time: 05/19/2020  8:14 PM   Specimen: Nasopharyngeal  Result Value Ref Range Status   MRSA by PCR POSITIVE (A) NEGATIVE Final    Comment:        The GeneXpert MRSA Assay (FDA approved for NASAL specimens only), is one component of a comprehensive MRSA colonization surveillance program. It is not intended to diagnose MRSA infection nor to guide or monitor treatment for MRSA infections. CRITICAL RESULT CALLED TO, READ BACK BY AND VERIFIED WITH: MEGAN GRAF _0  04/22/2020 TTG Performed at [facility], [address] [place], [place] [address]          Radiology Studies: No results found.      Scheduled Meds: . acetylcysteine  3 mL Nebulization BID  . aspirin EC  81 mg Oral Daily  . atorvastatin  40 mg Oral Daily  . Chlorhexidine Gluconate Cloth  6 each Topical Q0600  . cholecalciferol  2,000 Units Oral Daily  . enoxaparin (LOVENOX) injection  30 mg Subcutaneous Q24H  . feeding supplement (ENSURE ENLIVE)  237 mL Oral BID BM  . guaiFENesin  600 mg Oral BID  . ipratropium-albuterol  3 mL Nebulization QID  . mouth rinse  15 mL Mouth Rinse BID  . methylPREDNISolone (SOLU-MEDROL) injection  40 mg Intravenous Q12H   Continuous Infusions:    LOS: 6 days    Enzo Bi, MD [facility] Pager [phone]  If 7PM-7AM, please contact night-coverage [email or web address] 05/20/2020, 3:35 AM

## 2020-05-19 NOTE — TOC Progression Note (Signed)
Transition of Care [facility]) - Progression Note    Patient Details  Name: Stephanie Collins MRN: [id number] Date of Birth: January 21, 1932  Transition of [facility]) CM/SW Contact  Beverly Sessions, RN Phone Number: 05/19/2020, 2:43 PM  Clinical Narrative:      Anticipated discharge to [facility] tomorrow.  Seth Bake at [facility] notified   Expected Discharge Plan: [place] Barriers to Discharge: Continued Medical Work up  Expected Discharge Plan and Services Expected Discharge Plan: [place]       Living arrangements for the past 2 months: Single Family Home                                       Social Determinants of Health (SDOH) Interventions    Readmission Risk Interventions Readmission Risk Prevention Plan 05/17/2020  Transportation Screening Complete  Medication Review Press photographer) Complete  Some recent data might be hidden

## 2020-05-19 NOTE — Care Management Important Message (Signed)
Important Message  Patient Details  Name: Stephanie Collins MRN: [id number] Date of Birth: [birth year]   Medicare Important Message Given:  Yes     Dannette Barbara 05/19/2020, 1:28 PM

## 2020-05-19 NOTE — Progress Notes (Signed)
Mobility Specialist - Progress Note   05/19/20 1414  Mobility  Activity Contraindicated/medical hold  Mobility performed by Mobility specialist    Per discussion w/ nurse, pt has been "feeling anxious and tired" today. Will hold and re-attempt session at a later date/time when appropriate.     Akai Dollard Mobility Specialist  05/19/20, 2:15 PM

## 2020-05-20 ENCOUNTER — Inpatient Hospital Stay: Payer: Medicare Other

## 2020-05-20 DIAGNOSIS — J189 Pneumonia, unspecified organism: Secondary | ICD-10-CM | POA: Diagnosis not present

## 2020-05-20 LAB — BASIC METABOLIC PANEL
Anion gap: 8 (ref 5–15)
BUN: 49 mg/dL — ABNORMAL HIGH (ref 8–23)
CO2: 24 mmol/L (ref 22–32)
Calcium: 8.9 mg/dL (ref 8.9–10.3)
Chloride: 98 mmol/L (ref 98–111)
Creatinine, Ser: 0.89 mg/dL (ref 0.44–1.00)
GFR calc Af Amer: >60 mL/min (ref >60)
GFR calc non Af Amer: 59 mL/min — ABNORMAL LOW (ref >60)
Glucose, Bld: 162 mg/dL — ABNORMAL HIGH (ref 70–99)
Potassium: 4.4 mmol/L (ref 3.5–5.1)
Sodium: 130 mmol/L — ABNORMAL LOW (ref 135–145)

## 2020-05-20 LAB — CBC
HCT: 33.2 % — ABNORMAL LOW (ref 36.0–46.0)
Hemoglobin: 11.4 g/dL — ABNORMAL LOW (ref 12.0–15.0)
MCH: 29.8 pg (ref 26.0–34.0)
MCHC: 34.3 g/dL (ref 30.0–36.0)
MCV: 86.7 fL (ref 80.0–100.0)
Platelets: 305 10*3/uL (ref 150–400)
RBC: 3.83 MIL/uL — ABNORMAL LOW (ref 3.87–5.11)
RDW: 17 % — ABNORMAL HIGH (ref 11.5–15.5)
WBC: 7.9 10*3/uL (ref 4.0–10.5)
nRBC: 0 % (ref 0.0–0.2)

## 2020-05-20 LAB — MAGNESIUM: Magnesium: 2.1 mg/dL (ref 1.7–2.4)

## 2020-05-20 MED ORDER — MORPHINE SULFATE 10 MG/5ML PO SOLN
5.0000 mg | Freq: Four times a day (QID) | ORAL | Status: DC
  Administered 2020-05-20 – 2020-05-21 (×4): 5 mg via ORAL
  Filled 2020-05-20 (×4): qty 5
  Filled 2020-05-20: qty 4
  Filled 2020-05-20: qty 5

## 2020-05-20 MED ORDER — METHYLPREDNISOLONE SODIUM SUCC 40 MG IJ SOLR
40.0000 mg | Freq: Three times a day (TID) | INTRAMUSCULAR | Status: DC
  Administered 2020-05-20 – 2020-05-21 (×5): 40 mg via INTRAVENOUS
  Filled 2020-05-20 (×5): qty 1

## 2020-05-20 NOTE — Plan of Care (Signed)
Continuing with plan of care. 

## 2020-05-20 NOTE — Progress Notes (Signed)
Mobility Specialist - Progress Note   05/20/20 1235  Mobility  Activity Transferred to/from [facility];Transferred:  Bed to chair  Level of Assistance Minimal assist, patient does 75% or more  Assistive Device Front wheel walker  Distance Ambulated (ft) 6 ft  Mobility Response Tolerated well  Mobility performed by Mobility specialist  $Mobility charge 1 Mobility    Pre-mobility: 104 HR, 112/60 BP, 96% SpO2 Post-mobility: 104 HR, 120/77 BP, 97% SpO2   Pt laying in bed w/ daughter present in room. Pt agreed to session. Pt transitioned from supine to sit EOB w/ SBA. Pt requested to go to [facility]. Pt transferred to [facility] w/ min. Assist for guidance and steadying d/t limited amount of space b/w bed, BSC, and recliner. Pt unable to have an output on BSC. Pt, then, requested to transfer to recliner. Pt transferred to recliner w/ min. Assist for guidance and steadying d/t limited amount of space. Although, pt had a stable gait t/o session. Pt did really well following VCs. Pt did desat to 87% during session. Pt encouraged to utilize PLB to manage SOB and O2. Pt was on 4L O2  t/o session. Pt did c/o back pain. However, states it's "normal" for her. Overall, pt tolerated session well. Pt left in recliner w/ daughter present in room. Call bell and phone placed in reach.   Mattson Dayal Mobility Specialist  05/20/20, 12:45 PM

## 2020-05-20 NOTE — Progress Notes (Signed)
   05/20/20 2103  Assess: MEWS Score  Temp 98.2 F (36.8 C)  BP 106/65  Pulse Rate (!) 102  Resp (!) 22  SpO2 97 %  O2 Device Nasal Cannula  Assess: MEWS Score  MEWS Temp 0  MEWS Systolic 0  MEWS Pulse 1  MEWS RR 1  MEWS LOC 0  MEWS Score 2  MEWS Score Color Yellow  Assess: if the MEWS score is Yellow or Red  Were vital signs taken at a resting state? Yes  MEWS guidelines implemented *See Row Information* Yes  Treat  MEWS Interventions Escalated (See documentation below)  Take Vital Signs  Increase Vital Sign Frequency  Yellow: Q 2hr X 2 then Q 4hr X 2, if remains yellow, continue Q 4hrs  Escalate  MEWS: Escalate Yellow: discuss with charge nurse/RN and consider discussing with provider and RRT  Notify: Charge Nurse/RN  Name of Charge Nurse/RN Notified Karie Fetch  Date Charge Nurse/RN Notified 05/20/20  Time Charge Nurse/RN Notified 2126  Notify: Provider  Provider Name/Title Sharion Settler, NP Sharion Settler, NP)  Date Provider Notified 05/20/20  Time Provider Notified 2127  Notification Type Page  Notification Reason Change in status (Esclated MEWS; Yellow)  Response No new orders  Patient had previous been tachyphemic earlier in the day.  Morphine administered earlier, good relief

## 2020-05-20 NOTE — Progress Notes (Signed)
Patient is having increased work of breathing and labored, crackles, rhonchi, and wheezing to the bilateral lungs.  RT assessed patient.  Dr. Billie Ruddy notified and in with patient.  Will continue to monitor the patient and follow MD orders.

## 2020-05-20 NOTE — Progress Notes (Signed)
Patient had previous been tachyphemic earlier in the day.  Morphine administered earlier, good relief

## 2020-05-20 DEATH — deceased

## 2020-05-21 DIAGNOSIS — J189 Pneumonia, unspecified organism: Secondary | ICD-10-CM | POA: Diagnosis not present

## 2020-05-21 LAB — CBC
HCT: 32.5 % — ABNORMAL LOW (ref 36.0–46.0)
Hemoglobin: 10.7 g/dL — ABNORMAL LOW (ref 12.0–15.0)
MCH: 27.4 pg (ref 26.0–34.0)
MCHC: 32.9 g/dL (ref 30.0–36.0)
MCV: 83.3 fL (ref 80.0–100.0)
Platelets: 330 10*3/uL (ref 150–400)
RBC: 3.9 MIL/uL (ref 3.87–5.11)
RDW: 15.6 % — ABNORMAL HIGH (ref 11.5–15.5)
WBC: 8.7 10*3/uL (ref 4.0–10.5)
nRBC: 0 % (ref 0.0–0.2)

## 2020-05-21 LAB — MAGNESIUM: Magnesium: 2.1 mg/dL (ref 1.7–2.4)

## 2020-05-21 LAB — BASIC METABOLIC PANEL
Anion gap: 7 (ref 5–15)
BUN: 46 mg/dL — ABNORMAL HIGH (ref 8–23)
CO2: 26 mmol/L (ref 22–32)
Calcium: 9.2 mg/dL (ref 8.9–10.3)
Chloride: 96 mmol/L — ABNORMAL LOW (ref 98–111)
Creatinine, Ser: 0.97 mg/dL (ref 0.44–1.00)
GFR calc Af Amer: >60 mL/min (ref >60)
GFR calc non Af Amer: 53 mL/min — ABNORMAL LOW (ref >60)
Glucose, Bld: 156 mg/dL — ABNORMAL HIGH (ref 70–99)
Potassium: 5.6 mmol/L — ABNORMAL HIGH (ref 3.5–5.1)
Sodium: 129 mmol/L — ABNORMAL LOW (ref 135–145)

## 2020-05-21 MED ORDER — SODIUM POLYSTYRENE SULFONATE 15 GM/60ML PO SUSP
15.0000 g | Freq: Once | ORAL | Status: AC
  Administered 2020-05-21: 15 g via ORAL
  Filled 2020-05-21: qty 60

## 2020-05-21 NOTE — Progress Notes (Addendum)
PROGRESS NOTE    Stephanie Collins  GUR:[id number] DOB: [birth year] DOA: 05/05/2020 PCP: Baxter Hire, MD    Assessment & Plan:   Principal Problem:   HCAP (healthcare-associated pneumonia) Active Problems:   Chronic obstructive pulmonary disease ([place])   Hyponatremia   Sepsis ([place])   Chronic diastolic CHF (congestive heart failure) ([place])   Acute renal failure superimposed on stage 3a chronic kidney disease ([place])   UTI (urinary tract infection)   Elevated troponin   New onset atrial fibrillation ([place])   Acute metabolic encephalopathy   HTN (hypertension)   Sepsis: secondary to UTI & CAP.  Met criteria w/ leukocytosis, tachycardia, tachypnea.  --completed 5 days of vanc/cefepime  CAP: --on home 4L O2.  Reported having difficulty bringing up sputum --Strept is neg --completed 5 days of vanc/cefepime  UTI: urine cx is growing e.coli, proteus, both sensitive to cefepime. --completed 5 days of cefepime.  Acute on chronic COPD exacerbation, worsening Chronic hypoxic respiratory failure on 4L O2 baseline --In light of persistent dyspnea despite finishing PNA treatment, and having increased brown thick sputum, pt is having acute exacerbation of COPD. PLAN: --increase solumedrol to 40 mg q8h --DuoNeb QID with chest PT --start morphine 5 mg q6h for severe dyspnea and air hunger.  Hx of HTN, not currently active Hypotension --BP has been 80's-100's --continue to hold home coreg and ACEi --caution IVF boluses due to [facility]  Hyponatremia --encourage oral hydration.  Chronic diastolic CHF:  echo 2/83/1517 showed EF of 55 to 60% with grade II diastolic dysfunction.   --cont to hold home lasix 20 mg due to hypotension --caution IVF boluses  AKI on CKDIIIa: resolved  A. fib: new onset.  D/c IV heparin drip. Not on anticoagulation secondary to fall risk. Continue on tele. Cardio following and recs apprec   elevated troponins, likely secondary to demand ischemia & a. fib.    Acute metabolic encephalopathy:  likely multifactorial, including CAP, UTI & hypoxia. Re-orient prn   Discoloration of bilateral feet:  no ischemia as per vascular surg  Severe malnutrition in context of chronic illness --Ensure BID   DVT prophylaxis: Lovenox SQ Code Status: DNR  Code status changed to DNR today, after discussing with pt and daughter at the bedside.  Pt does not want CPR nor breathing tube.  Primary nurse Ms. Isenhour as a witness of this conversation in the room.   Family Communication: updated daughter at the bedside today  Status is: inpatient Dispo:   The patient is from: home Anticipated d/c is to: SNF Anticipated d/c date is: >3 days Patient currently is not medically stable to d/c due to: still having dyspnea and rhonchi due to mucus and difficulty clearing, need chest PT, currently on IV solumedrol (increased dose) for COPD exacerbation.  Due to age, already advanced COPD, co-morbidities, frailty, pt is at high risk of decompensation.  Will get palliative care involved on Monday.   Consultants:   Cardio  vascular   Procedures:    Antimicrobials: vanco, cefepime    Subjective: Pt had increased work of breathing today.  More anxious.    Discussed code status at bedside with pt and daughter, and primary nurse present.  Confirmed that pt will be DNR, no CPR, no intubation.   Objective: Vitals:   05/20/20 2316 05/21/20 0426 05/21/20 0845 05/21/20 1613  BP: 121/78 122/83 108/62 108/75  Pulse: (!) 108 (!) 108 (!) 108 (!) 105  Resp: (!) 24 (!) 24  17  Temp: 98.9 F (37.2  C) (!) 97.3 F (36.3 C) 98 F (36.7 C) 97.6 F (36.4 C)  TempSrc:  Axillary  Oral  SpO2: 95% 100% 97% 100%  Weight:      Height:        Intake/Output Summary (Last 24 hours) at 05/21/2020 1719 Last data filed at 05/21/2020 1413 Gross per 24 hour  Intake 240 ml  Output 400 ml  Net -160 ml   Filed Weights   04/30/2020 1259 05/15/2020 1523  Weight: 43.5 kg 42.2 kg     Examination:  Constitutional: NAD, AAOx3, cachetic and ill appearing. HEENT: conjunctivae and lids normal, EOMI CV: RRR no M,R,G. Distal pulses +2.  No cyanosis.   RESP: diffuse loud rhonchi, increased RR GI: +BS, NTND Extremities: No effusions, edema in BLE SKIN: warm, dry and intact Neuro: II - XII grossly intact.  Sensation intact Psych: Depressed and anxious mood and affect.     Data Reviewed: I have personally reviewed following labs and imaging studies  CBC: Recent Labs  Lab 05/17/20 0550 05/18/20 0358 05/19/20 0651 05/20/20 0859 05/21/20 0550  WBC 18.0* 13.9* 11.9* 7.9 8.7  HGB 10.9* 10.7* 11.1* 11.4* 10.7*  HCT 30.3* 30.4* 32.0* 33.2* 32.5*  MCV 84.6 84.9 80.8 86.7 83.3  PLT 333 323 324 305 333   Basic Metabolic Panel: Recent Labs  Lab 05/17/20 0550 05/18/20 0358 05/19/20 0651 05/20/20 0426 05/21/20 0550  NA 131* 131* 131* 130* 129*  K 4.0 4.1 4.1 4.4 5.6*  CL 98 96* 98 98 96*  CO2 $Re'24 26 24 24 26  'bxF$ GLUCOSE 95 96 93 162* 156*  BUN 48* 50* 41* 49* 46*  CREATININE 1.13* 1.16* 1.03* 0.89 0.97  CALCIUM 8.2* 8.3* 8.5* 8.9 9.2  MG  --   --  2.0 2.1 2.1   GFR: Estimated Creatinine Clearance: 27.7 mL/min (by C-G formula based on SCr of 0.97 mg/dL). Liver Function Tests: No results for input(s): AST, ALT, ALKPHOS, BILITOT, PROT, ALBUMIN in the last 168 hours. No results for input(s): LIPASE, AMYLASE in the last 168 hours. No results for input(s): AMMONIA in the last 168 hours. Coagulation Profile: No results for input(s): INR, PROTIME in the last 168 hours. Cardiac Enzymes: No results for input(s): CKTOTAL, CKMB, CKMBINDEX, TROPONINI in the last 168 hours. BNP (last 3 results) No results for input(s): PROBNP in the last 8760 hours. HbA1C: No results for input(s): HGBA1C in the last 72 hours. CBG: No results for input(s): GLUCAP in the last 168 hours. Lipid Profile: No results for input(s): CHOL, HDL, LDLCALC, TRIG, CHOLHDL, LDLDIRECT in the last 72  hours. Thyroid Function Tests: No results for input(s): TSH, T4TOTAL, FREET4, T3FREE, THYROIDAB in the last 72 hours. Anemia Panel: No results for input(s): VITAMINB12, FOLATE, FERRITIN, TIBC, IRON, RETICCTPCT in the last 72 hours. Sepsis Labs: No results for input(s): PROCALCITON, LATICACIDVEN in the last 168 hours.  Recent Results (from the past 240 hour(s))  Urine Culture     Status: Abnormal   Collection Time: 05/08/2020 12:32 PM   Specimen: Urine, Random  Result Value Ref Range Status   Specimen Description   Final    URINE, RANDOM Performed at [facility], [address], [place], [place] [address]    Special Requests   Final    NONE Performed at [facility], [address] [place], [place] [address]    Culture (A)  Final    >=100,000 COLONIES/mL ESCHERICHIA COLI >=100,000 COLONIES/mL PROTEUS MIRABILIS    Report Status 05/19/2020 FINAL  Final   Organism ID, Bacteria ESCHERICHIA COLI (A)  Final   Organism ID, Bacteria PROTEUS MIRABILIS (A)  Final      Susceptibility   Escherichia coli - MIC*    AMPICILLIN 4 SENSITIVE Sensitive     CEFAZOLIN <=4 SENSITIVE Sensitive     CEFTRIAXONE <=0.25 SENSITIVE Sensitive     CIPROFLOXACIN <=0.25 SENSITIVE Sensitive     GENTAMICIN <=1 SENSITIVE Sensitive     IMIPENEM <=0.25 SENSITIVE Sensitive     NITROFURANTOIN <=16 SENSITIVE Sensitive     TRIMETH/SULFA <=20 SENSITIVE Sensitive     AMPICILLIN/SULBACTAM <=2 SENSITIVE Sensitive     PIP/TAZO <=4 SENSITIVE Sensitive     * >=100,000 COLONIES/mL ESCHERICHIA COLI   Proteus mirabilis - MIC*    AMPICILLIN <=2 SENSITIVE Sensitive     CEFAZOLIN <=4 SENSITIVE Sensitive     CEFTRIAXONE <=0.25 SENSITIVE Sensitive     CIPROFLOXACIN <=0.25 SENSITIVE Sensitive     GENTAMICIN <=1 SENSITIVE Sensitive     IMIPENEM 1 SENSITIVE Sensitive     NITROFURANTOIN RESISTANT Resistant     TRIMETH/SULFA <=20 SENSITIVE Sensitive     AMPICILLIN/SULBACTAM <=2 SENSITIVE Sensitive      PIP/TAZO <=4 SENSITIVE Sensitive     * >=100,000 COLONIES/mL PROTEUS MIRABILIS  Blood culture (routine x 2)     Status: None   Collection Time: 05/10/2020  2:06 PM   Specimen: BLOOD  Result Value Ref Range Status   Specimen Description BLOOD LEFT FORE ARM  Final   Special Requests   Final    BOTTLES DRAWN AEROBIC AND ANAEROBIC Blood Culture adequate volume   Culture   Final    NO GROWTH 5 DAYS Performed at [facility], [address] [place], [place] [address]    Report Status 05/19/2020 FINAL  Final  Blood culture (routine x 2)     Status: None   Collection Time: 05/13/2020  2:06 PM   Specimen: BLOOD  Result Value Ref Range Status   Specimen Description BLOOD RIGHT AC  Final   Special Requests   Final    BOTTLES DRAWN AEROBIC AND ANAEROBIC Blood Culture results may not be optimal due to an excessive volume of blood received in culture bottles   Culture   Final    NO GROWTH 5 DAYS Performed at [facility], [address] [place],  [address]    Report Status 05/19/2020 FINAL  Final  Respiratory Panel by RT PCR (Flu A&B, Covid) - Nasopharyngeal Swab     Status: None   Collection Time: 05/07/2020  2:06 PM   Specimen: Nasopharyngeal Swab  Result Value Ref Range Status   SARS Coronavirus 2 by RT PCR NEGATIVE NEGATIVE Final    Comment: (NOTE) SARS-CoV-2 target nucleic acids are NOT DETECTED.  The SARS-CoV-2 RNA is generally detectable in upper respiratoy specimens during the acute phase of infection. The lowest concentration of SARS-CoV-2 viral copies this assay can detect is 131 copies/mL. A negative result does not preclude SARS-Cov-2 infection and should not be used as the sole basis for treatment or other patient management decisions. A negative result may occur with  improper specimen collection/handling, submission of specimen other than nasopharyngeal swab, presence of viral mutation(s) within the areas targeted by this assay, and inadequate number  of viral copies (<131 copies/mL). A negative result must be combined with clinical observations, patient history, and epidemiological information. The expected result is Negative.  Fact Sheet for Patients:  PinkCheek.be  Fact Sheet for Healthcare Providers:  GravelBags.it  This test is no t yet approved or cleared by the [place] and  has been authorized for detection and/or diagnosis of SARS-CoV-2 by FDA under an Emergency Use Authorization (EUA). This EUA will remain  in effect (meaning this test can be used) for the duration of the COVID-19 declaration under Section 564(b)(1) of the Act, 21 U.S.C. section 360bbb-3(b)(1), unless the authorization is terminated or revoked sooner.     Influenza A by PCR NEGATIVE NEGATIVE Final   Influenza B by PCR NEGATIVE NEGATIVE Final    Comment: (NOTE) The Xpert Xpress SARS-CoV-2/FLU/RSV assay is intended as an aid in  the diagnosis of influenza from Nasopharyngeal swab specimens and  should not be used as a sole basis for treatment. Nasal washings and  aspirates are unacceptable for Xpert Xpress SARS-CoV-2/FLU/RSV  testing.  Fact Sheet for Patients: PinkCheek.be  Fact Sheet for Healthcare Providers: GravelBags.it  This test is not yet approved or cleared by the [place] FDA and  has been authorized for detection and/or diagnosis of SARS-CoV-2 by  FDA under an Emergency Use Authorization (EUA). This EUA will remain  in effect (meaning this test can be used) for the duration of the  Covid-19 declaration under Section 564(b)(1) of the Act, 21  U.S.C. section 360bbb-3(b)(1), unless the authorization is  terminated or revoked. Performed at [facility], [address] [place], [place] [address]   MRSA PCR Screening     Status: Abnormal   Collection Time: 04/28/2020  8:14 PM   Specimen: Nasopharyngeal   Result Value Ref Range Status   MRSA by PCR POSITIVE (A) NEGATIVE Final    Comment:        The GeneXpert MRSA Assay (FDA approved for NASAL specimens only), is one component of a comprehensive MRSA colonization surveillance program. It is not intended to diagnose MRSA infection nor to guide or monitor treatment for MRSA infections. CRITICAL RESULT CALLED TO, READ BACK BY AND VERIFIED WITH: MEGAN GRAF $[email or web address]'[email or web address]$  05/09/2020 TTG Performed at [facility], [address], [place], [place] [address]          Radiology Studies: [facility] 1 View  Result Date: 05/20/2020 CLINICAL DATA:  Shortness of breath x2 weeks. EXAM: PORTABLE CHEST 1 VIEW COMPARISON:  May 14, 2020 FINDINGS: Moderate severity stable diffusely increased interstitial lung markings are noted. Mild, stable bibasilar atelectasis is seen, left greater than right. There are small, stable bilateral pleural effusions. No pneumothorax is identified. There is mild prominence of the suprahilar pulmonary vasculature. The cardiac silhouette is markedly enlarged and unchanged in size. Degenerative changes seen throughout the thoracic spine. IMPRESSION: 1. Stable cardiomegaly and mild pulmonary vascular congestion. 2. Stable small bilateral pleural effusions. 3. Stable bibasilar atelectasis, left greater than right. Electronically Signed   By: Virgina Norfolk M.D.   On: 05/20/2020 15:47        Scheduled Meds: . acetylcysteine  3 mL Nebulization BID  . aspirin EC  81 mg Oral Daily  . atorvastatin  40 mg Oral Daily  . cholecalciferol  2,000 Units Oral Daily  . enoxaparin (LOVENOX) injection  30 mg Subcutaneous Q24H  . feeding supplement (ENSURE ENLIVE)  237 mL Oral BID BM  . guaiFENesin  600 mg Oral BID  . ipratropium-albuterol  3 mL Nebulization QID  . mouth rinse  15 mL Mouth Rinse BID  . methylPREDNISolone (SOLU-MEDROL) injection  40 mg Intravenous Q8H  . morphine  5 mg Oral Q6H   Continuous  Infusions:  LOS: 7 days    Enzo Bi, MD [facility] Pager [phone]  If 7PM-7AM, please contact night-coverage [email or web address] 05/21/2020, 5:19 PM

## 2020-05-21 NOTE — Plan of Care (Signed)
Continuing with plan of care. 

## 2020-05-21 NOTE — Progress Notes (Signed)
This note also relates to the following rows which could not be included: ECG Heart Rate - Cannot attach notes to unvalidated device data Resp - Cannot attach notes to unvalidated device data

## 2020-05-21 NOTE — Progress Notes (Signed)
   05/20/20 2316  Assess: MEWS Score  Temp 98.9 F (37.2 C)  BP 121/78  Pulse Rate (!) 108  Resp (!) 24  SpO2 95 %  O2 Device Nasal Cannula  O2 Flow Rate (L/min) 4 L/min  Assess: MEWS Score  MEWS Temp 0  MEWS Systolic 0  MEWS Pulse 1  MEWS RR 1  MEWS LOC 0  MEWS Score 2  MEWS Score Color Yellow  Assess: if the MEWS score is Yellow or Red  Were vital signs taken at a resting state? Yes  Focused Assessment No change from prior assessment  Early Detection of Sepsis Score *See Row Information* Medium  MEWS guidelines implemented *See Row Information* No, previously yellow, continue vital signs every 4 hours  Treat  MEWS Interventions Other (Comment) (continue to monitor)  Pain Scale 0-10  Pain Score 0  Take Vital Signs  Increase Vital Sign Frequency  Yellow: Q 2hr X 2 then Q 4hr X 2, if remains yellow, continue Q 4hrs  Document  Patient Outcome Other (Comment) (Patient continued with Yellow MEWS VS)  Progress note created (see row info) Yes  No changes to continue plan, continue to monitor.

## 2020-05-21 NOTE — Progress Notes (Signed)
   05/21/20 0426  Assess: MEWS Score  Temp (!) 97.3 F (36.3 C)  BP 122/83  Pulse Rate (!) 108  Resp (!) 24  SpO2 100 %  O2 Device Nasal Cannula  Assess: MEWS Score  MEWS Temp 0  MEWS Systolic 0  MEWS Pulse 1  MEWS RR 1  MEWS LOC 0  MEWS Score 2  MEWS Score Color Yellow  Assess: if the MEWS score is Yellow or Red  Were vital signs taken at a resting state? Yes  Focused Assessment No change from prior assessment  Early Detection of Sepsis Score *See Row Information* Medium  MEWS guidelines implemented *See Row Information* No, previously yellow, continue vital signs every 4 hours  Treat  MEWS Interventions Administered scheduled meds/treatments  Pain Scale 0-10  Pain Score 0  Escalate  MEWS: Escalate Yellow: discuss with charge nurse/RN and consider discussing with provider and RRT  Notify: Charge Nurse/RN  Name of Charge Nurse/RN Notified Rosann Auerbach  Date Charge Nurse/RN Notified 05/21/20  Time Charge Nurse/RN Notified 0527  Patient declined offered morphine, drifting in and out of sleep.

## 2020-05-21 NOTE — Progress Notes (Signed)
Mobility Specialist - Progress Note   05/21/20 1000  Mobility  Activity Contraindicated/medical hold  Mobility performed by Mobility specialist     Mobility session on hold d/t pt's increased K+ levels, currently 5.6, sitting outside safety guidelines for mobility. Will continue to monitor and attempt session when pt is medically appropriate.    Kathee Delton Mobility Specialist 05/21/20, 10:03 AM

## 2020-05-21 NOTE — Progress Notes (Signed)
PROGRESS NOTE    Stephanie Collins  VCB:[id number] DOB: [birth year] DOA: 04/29/2020 PCP: Baxter Hire, MD    Assessment & Plan:   Principal Problem:   HCAP (healthcare-associated pneumonia) Active Problems:   Chronic obstructive pulmonary disease ([place])   Hyponatremia   Sepsis ([place])   Chronic diastolic CHF (congestive heart failure) ([place])   Acute renal failure superimposed on stage 3a chronic kidney disease ([place])   UTI (urinary tract infection)   Elevated troponin   New onset atrial fibrillation ([place])   Acute metabolic encephalopathy   HTN (hypertension)   Sepsis: secondary to UTI & CAP.  Met criteria w/ leukocytosis, tachycardia, tachypnea.  --completed 5 days of vanc/cefepime  CAP: --on home 4L O2.  Reported having difficulty bringing up sputum --Strept is neg --completed 5 days of vanc/cefepime  UTI: urine cx is growing e.coli, proteus, both sensitive to cefepime. --completed 5 days of cefepime.  Acute on chronic COPD exacerbation Chronic hypoxic respiratory failure on 4L O2 baseline --In light of persistent dyspnea despite finishing PNA treatment, and having increased brown thick sputum, pt is having acute exacerbation of COPD. PLAN: --continue solumedrol 40 mg q8h --DuoNeb QID with chest PT  # Severe dyspnea --started morphine 5 mg q6h for severe dyspnea and air hunger on 10/1, with improvement. --continue morphine 5 mg q6h  Hx of HTN, not currently active Hypotension --BP has been 100's-130's --continue to hold home coreg and ACEi due to intermittent low BP --caution IVF boluses due to dCHF  Hyponatremia --encourage oral hydration.  Chronic diastolic CHF:  echo 3/84/6659 showed EF of 55 to 60% with grade II diastolic dysfunction.   --cont to hold home lasix 20 mg due to intermittent low BP --caution IVF boluses  AKI on CKDIIIa: resolved  A. fib: new onset.  D/c IV heparin drip. Not on anticoagulation secondary to fall risk. Continue on tele. Cardio  following and recs apprec   elevated troponins, likely secondary to demand ischemia & a. fib.   Acute metabolic encephalopathy:  likely multifactorial, including CAP, UTI & hypoxia. Re-orient prn   Discoloration of bilateral feet:  no ischemia as per vascular surg  Severe malnutrition in context of chronic illness --Ensure BID   DVT prophylaxis: Lovenox SQ Code Status: DNR    Family Communication:  Status is: inpatient Dispo:   The patient is from: home Anticipated d/c is to: SNF Anticipated d/c date is: 2-3 days Patient currently is not medically stable to d/c due to: still having dyspnea and rhonchi due to mucus and difficulty clearing, need chest PT, currently on IV solumedrol (increased dose) for COPD exacerbation.  Due to age, already advanced COPD, co-morbidities, frailty, pt is at high risk of decompensation.  Will get palliative care involved on Monday.   Consultants:   Cardio  vascular   Procedures:    Antimicrobials: vanco, cefepime    Subjective: Dyspnea improved with scheduled low-dose morphine.  Mucus clearance also improved.     Objective: Vitals:   05/20/20 2316 05/21/20 0426 05/21/20 0845 05/21/20 1613  BP: 121/78 122/83 108/62 108/75  Pulse: (!) 108 (!) 108 (!) 108 (!) 105  Resp: (!) 24 (!) 24  17  Temp: 98.9 F (37.2 C) (!) 97.3 F (36.3 C) 98 F (36.7 C) 97.6 F (36.4 C)  TempSrc:  Axillary  Oral  SpO2: 95% 100% 97% 100%  Weight:      Height:        Intake/Output Summary (Last 24 hours) at 05/21/2020 1800 Last  data filed at 05/21/2020 1413 Gross per 24 hour  Intake 240 ml  Output 400 ml  Net -160 ml   Filed Weights   05/06/2020 1259 04/23/2020 1523  Weight: 43.5 kg 42.2 kg    Examination:  Constitutional: NAD, alert, oriented, cachetic, more comfortable today HEENT: conjunctivae and lids normal, EOMI CV: RRR no M,R,G. Distal pulses +2.  No cyanosis.   RESP: Less rhonchi, RR decreased, less work-of-breathing GI: +BS,  NTND Extremities: No effusions, edema in BLE SKIN: warm, dry and intact Neuro: II - XII grossly intact.  Sensation intact   Data Reviewed: I have personally reviewed following labs and imaging studies  CBC: Recent Labs  Lab 05/17/20 0550 05/18/20 0358 05/19/20 0651 05/20/20 0859 05/21/20 0550  WBC 18.0* 13.9* 11.9* 7.9 8.7  HGB 10.9* 10.7* 11.1* 11.4* 10.7*  HCT 30.3* 30.4* 32.0* 33.2* 32.5*  MCV 84.6 84.9 80.8 86.7 83.3  PLT 333 323 324 305 010   Basic Metabolic Panel: Recent Labs  Lab 05/17/20 0550 05/18/20 0358 05/19/20 0651 05/20/20 0426 05/21/20 0550  NA 131* 131* 131* 130* 129*  K 4.0 4.1 4.1 4.4 5.6*  CL 98 96* 98 98 96*  CO2 $Re'24 26 24 24 26  'DKg$ GLUCOSE 95 96 93 162* 156*  BUN 48* 50* 41* 49* 46*  CREATININE 1.13* 1.16* 1.03* 0.89 0.97  CALCIUM 8.2* 8.3* 8.5* 8.9 9.2  MG  --   --  2.0 2.1 2.1   GFR: Estimated Creatinine Clearance: 27.7 mL/min (by C-G formula based on SCr of 0.97 mg/dL). Liver Function Tests: No results for input(s): AST, ALT, ALKPHOS, BILITOT, PROT, ALBUMIN in the last 168 hours. No results for input(s): LIPASE, AMYLASE in the last 168 hours. No results for input(s): AMMONIA in the last 168 hours. Coagulation Profile: No results for input(s): INR, PROTIME in the last 168 hours. Cardiac Enzymes: No results for input(s): CKTOTAL, CKMB, CKMBINDEX, TROPONINI in the last 168 hours. BNP (last 3 results) No results for input(s): PROBNP in the last 8760 hours. HbA1C: No results for input(s): HGBA1C in the last 72 hours. CBG: No results for input(s): GLUCAP in the last 168 hours. Lipid Profile: No results for input(s): CHOL, HDL, LDLCALC, TRIG, CHOLHDL, LDLDIRECT in the last 72 hours. Thyroid Function Tests: No results for input(s): TSH, T4TOTAL, FREET4, T3FREE, THYROIDAB in the last 72 hours. Anemia Panel: No results for input(s): VITAMINB12, FOLATE, FERRITIN, TIBC, IRON, RETICCTPCT in the last 72 hours. Sepsis Labs: No results for input(s):  PROCALCITON, LATICACIDVEN in the last 168 hours.  Recent Results (from the past 240 hour(s))  Urine Culture     Status: Abnormal   Collection Time: 04/24/2020 12:32 PM   Specimen: Urine, Random  Result Value Ref Range Status   Specimen Description   Final    URINE, RANDOM Performed at [facility], [address], [place], [place] [address]    Special Requests   Final    NONE Performed at [facility], [address] [place],  [address]    Culture (A)  Final    >=100,000 COLONIES/mL ESCHERICHIA COLI >=100,000 COLONIES/mL PROTEUS MIRABILIS    Report Status 05/19/2020 FINAL  Final   Organism ID, Bacteria ESCHERICHIA COLI (A)  Final   Organism ID, Bacteria PROTEUS MIRABILIS (A)  Final      Susceptibility   Escherichia coli - MIC*    AMPICILLIN 4 SENSITIVE Sensitive     CEFAZOLIN <=4 SENSITIVE Sensitive     CEFTRIAXONE <=0.25 SENSITIVE Sensitive  CIPROFLOXACIN <=0.25 SENSITIVE Sensitive     GENTAMICIN <=1 SENSITIVE Sensitive     IMIPENEM <=0.25 SENSITIVE Sensitive     NITROFURANTOIN <=16 SENSITIVE Sensitive     TRIMETH/SULFA <=20 SENSITIVE Sensitive     AMPICILLIN/SULBACTAM <=2 SENSITIVE Sensitive     PIP/TAZO <=4 SENSITIVE Sensitive     * >=100,000 COLONIES/mL ESCHERICHIA COLI   Proteus mirabilis - MIC*    AMPICILLIN <=2 SENSITIVE Sensitive     CEFAZOLIN <=4 SENSITIVE Sensitive     CEFTRIAXONE <=0.25 SENSITIVE Sensitive     CIPROFLOXACIN <=0.25 SENSITIVE Sensitive     GENTAMICIN <=1 SENSITIVE Sensitive     IMIPENEM 1 SENSITIVE Sensitive     NITROFURANTOIN RESISTANT Resistant     TRIMETH/SULFA <=20 SENSITIVE Sensitive     AMPICILLIN/SULBACTAM <=2 SENSITIVE Sensitive     PIP/TAZO <=4 SENSITIVE Sensitive     * >=100,000 COLONIES/mL PROTEUS MIRABILIS  Blood culture (routine x 2)     Status: None   Collection Time: 04/25/2020  2:06 PM   Specimen: BLOOD  Result Value Ref Range Status   Specimen Description BLOOD LEFT FORE ARM  Final   Special  Requests   Final    BOTTLES DRAWN AEROBIC AND ANAEROBIC Blood Culture adequate volume   Culture   Final    NO GROWTH 5 DAYS Performed at [facility], [address] [place], [place] [address]    Report Status 05/19/2020 FINAL  Final  Blood culture (routine x 2)     Status: None   Collection Time: 05/09/2020  2:06 PM   Specimen: BLOOD  Result Value Ref Range Status   Specimen Description BLOOD RIGHT AC  Final   Special Requests   Final    BOTTLES DRAWN AEROBIC AND ANAEROBIC Blood Culture results may not be optimal due to an excessive volume of blood received in culture bottles   Culture   Final    NO GROWTH 5 DAYS Performed at [facility], [address] [place], [place] [address]    Report Status 05/19/2020 FINAL  Final  Respiratory Panel by RT PCR (Flu A&B, Covid) - Nasopharyngeal Swab     Status: None   Collection Time: 04/20/2020  2:06 PM   Specimen: Nasopharyngeal Swab  Result Value Ref Range Status   SARS Coronavirus 2 by RT PCR NEGATIVE NEGATIVE Final    Comment: (NOTE) SARS-CoV-2 target nucleic acids are NOT DETECTED.  The SARS-CoV-2 RNA is generally detectable in upper respiratoy specimens during the acute phase of infection. The lowest concentration of SARS-CoV-2 viral copies this assay can detect is 131 copies/mL. A negative result does not preclude SARS-Cov-2 infection and should not be used as the sole basis for treatment or other patient management decisions. A negative result may occur with  improper specimen collection/handling, submission of specimen other than nasopharyngeal swab, presence of viral mutation(s) within the areas targeted by this assay, and inadequate number of viral copies (<131 copies/mL). A negative result must be combined with clinical observations, patient history, and epidemiological information. The expected result is Negative.  Fact Sheet for Patients:  PinkCheek.be  Fact Sheet for  Healthcare Providers:  GravelBags.it  This test is no t yet approved or cleared by the [place] FDA and  has been authorized for detection and/or diagnosis of SARS-CoV-2 by FDA under an Emergency Use Authorization (EUA). This EUA will remain  in effect (meaning this test can be used) for the duration of the COVID-19 declaration under Section 564(b)(1) of the Act,  21 U.S.C. section 360bbb-3(b)(1), unless the authorization is terminated or revoked sooner.     Influenza A by PCR NEGATIVE NEGATIVE Final   Influenza B by PCR NEGATIVE NEGATIVE Final    Comment: (NOTE) The Xpert Xpress SARS-CoV-2/FLU/RSV assay is intended as an aid in  the diagnosis of influenza from Nasopharyngeal swab specimens and  should not be used as a sole basis for treatment. Nasal washings and  aspirates are unacceptable for Xpert Xpress SARS-CoV-2/FLU/RSV  testing.  Fact Sheet for Patients: PinkCheek.be  Fact Sheet for Healthcare Providers: GravelBags.it  This test is not yet approved or cleared by the [place] FDA and  has been authorized for detection and/or diagnosis of SARS-CoV-2 by  FDA under an Emergency Use Authorization (EUA). This EUA will remain  in effect (meaning this test can be used) for the duration of the  Covid-19 declaration under Section 564(b)(1) of the Act, 21  U.S.C. section 360bbb-3(b)(1), unless the authorization is  terminated or revoked. Performed at [facility], [address] [place], [place] [address]   MRSA PCR Screening     Status: Abnormal   Collection Time: 04/27/2020  8:14 PM   Specimen: Nasopharyngeal  Result Value Ref Range Status   MRSA by PCR POSITIVE (A) NEGATIVE Final    Comment:        The GeneXpert MRSA Assay (FDA approved for NASAL specimens only), is one component of a comprehensive MRSA colonization surveillance program. It is not intended to diagnose  MRSA infection nor to guide or monitor treatment for MRSA infections. CRITICAL RESULT CALLED TO, READ BACK BY AND VERIFIED WITH: MEGAN GRAF $[email or web address]$  04/25/2020 TTG Performed at [facility], [address], [place], [place] [address]          Radiology Studies: [facility] 1 View  Result Date: 05/20/2020 CLINICAL DATA:  Shortness of breath x2 weeks. EXAM: PORTABLE CHEST 1 VIEW COMPARISON:  May 14, 2020 FINDINGS: Moderate severity stable diffusely increased interstitial lung markings are noted. Mild, stable bibasilar atelectasis is seen, left greater than right. There are small, stable bilateral pleural effusions. No pneumothorax is identified. There is mild prominence of the suprahilar pulmonary vasculature. The cardiac silhouette is markedly enlarged and unchanged in size. Degenerative changes seen throughout the thoracic spine. IMPRESSION: 1. Stable cardiomegaly and mild pulmonary vascular congestion. 2. Stable small bilateral pleural effusions. 3. Stable bibasilar atelectasis, left greater than right. Electronically Signed   By: Virgina Norfolk M.D.   On: 05/20/2020 15:47        Scheduled Meds:  acetylcysteine  3 mL Nebulization BID   aspirin EC  81 mg Oral Daily   atorvastatin  40 mg Oral Daily   cholecalciferol  2,000 Units Oral Daily   enoxaparin (LOVENOX) injection  30 mg Subcutaneous Q24H   feeding supplement (ENSURE ENLIVE)  237 mL Oral BID BM   guaiFENesin  600 mg Oral BID   ipratropium-albuterol  3 mL Nebulization QID   mouth rinse  15 mL Mouth Rinse BID   methylPREDNISolone (SOLU-MEDROL) injection  40 mg Intravenous Q8H   morphine  5 mg Oral Q6H   Continuous Infusions:    LOS: 7 days    Enzo Bi, MD [facility] Pager [phone]  If 7PM-7AM, please contact night-coverage [email or web address] 05/21/2020, 6:00 PM

## 2020-05-22 DIAGNOSIS — J189 Pneumonia, unspecified organism: Secondary | ICD-10-CM | POA: Diagnosis not present

## 2020-05-22 LAB — BASIC METABOLIC PANEL
Anion gap: 5 (ref 5–15)
BUN: 44 mg/dL — ABNORMAL HIGH (ref 8–23)
CO2: 32 mmol/L (ref 22–32)
Calcium: 8.7 mg/dL — ABNORMAL LOW (ref 8.9–10.3)
Chloride: 96 mmol/L — ABNORMAL LOW (ref 98–111)
Creatinine, Ser: 0.86 mg/dL (ref 0.44–1.00)
GFR calc Af Amer: >60 mL/min (ref >60)
GFR calc non Af Amer: >60 mL/min (ref >60)
Glucose, Bld: 148 mg/dL — ABNORMAL HIGH (ref 70–99)
Potassium: 5.4 mmol/L — ABNORMAL HIGH (ref 3.5–5.1)
Sodium: 133 mmol/L — ABNORMAL LOW (ref 135–145)

## 2020-05-22 LAB — CBC
HCT: 30.2 % — ABNORMAL LOW (ref 36.0–46.0)
Hemoglobin: 10.4 g/dL — ABNORMAL LOW (ref 12.0–15.0)
MCH: 30.1 pg (ref 26.0–34.0)
MCHC: 34.4 g/dL (ref 30.0–36.0)
MCV: 87.3 fL (ref 80.0–100.0)
Platelets: 331 10*3/uL (ref 150–400)
RBC: 3.46 MIL/uL — ABNORMAL LOW (ref 3.87–5.11)
RDW: 17.9 % — ABNORMAL HIGH (ref 11.5–15.5)
WBC: 8.3 10*3/uL (ref 4.0–10.5)
nRBC: 0 % (ref 0.0–0.2)

## 2020-05-22 LAB — MAGNESIUM: Magnesium: 2.2 mg/dL (ref 1.7–2.4)

## 2020-05-22 MED ORDER — MORPHINE SULFATE 10 MG/5ML PO SOLN
5.0000 mg | Freq: Four times a day (QID) | ORAL | Status: DC | PRN
  Administered 2020-05-22: 5 mg via ORAL

## 2020-05-22 MED ORDER — SODIUM POLYSTYRENE SULFONATE 15 GM/60ML PO SUSP
15.0000 g | Freq: Once | ORAL | Status: AC
  Administered 2020-05-22: 15 g via ORAL
  Filled 2020-05-22: qty 60

## 2020-05-22 MED ORDER — MORPHINE SULFATE 10 MG/5ML PO SOLN
5.0000 mg | Freq: Four times a day (QID) | ORAL | Status: DC
  Administered 2020-05-22 – 2020-05-23 (×5): 5 mg via ORAL
  Filled 2020-05-22 (×5): qty 5

## 2020-05-22 MED ORDER — METHYLPREDNISOLONE SODIUM SUCC 40 MG IJ SOLR
40.0000 mg | Freq: Two times a day (BID) | INTRAMUSCULAR | Status: DC
  Administered 2020-05-22 – 2020-05-24 (×5): 40 mg via INTRAVENOUS
  Filled 2020-05-22 (×5): qty 1

## 2020-05-22 NOTE — Progress Notes (Signed)
   05/22/20 1148  Assess: MEWS Score  Temp 97.7 F (36.5 C)  BP 115/76  Pulse Rate (!) 113  Resp (!) 24  Level of Consciousness Alert  SpO2 91 %  O2 Device Nasal Cannula  Patient Activity (if Appropriate) In chair  O2 Flow Rate (L/min) 4 L/min  Assess: MEWS Score  MEWS Temp 0  MEWS Systolic 0  MEWS Pulse 2  MEWS RR 1  MEWS LOC 0  MEWS Score 3  MEWS Score Color Yellow  Assess: if the MEWS score is Yellow or Red  Were vital signs taken at a resting state? Yes  Focused Assessment Change from prior assessment (see assessment flowsheet)  Early Detection of Sepsis Score *See Row Information* Low  MEWS guidelines implemented *See Row Information* Yes  Treat  MEWS Interventions Other (Comment)  Pain Scale 0-10  Pain Score 0  Take Vital Signs  Increase Vital Sign Frequency  Yellow: Q 2hr X 2 then Q 4hr X 2, if remains yellow, continue Q 4hrs  Escalate  MEWS: Escalate Yellow: discuss with charge nurse/RN and consider discussing with provider and RRT  Notify: Charge Nurse/RN  Name of Charge Nurse/RN Notified [place], RN  Date Charge Nurse/RN Notified 05/22/20  Time Charge Nurse/RN Notified 1204  Notify: Provider  Provider Name/Title Dr. Billie Ruddy  Date Provider Notified 05/22/20  Time Provider Notified 1204  Notification Type Page  Notification Reason Change in status  Response Other (Comment) (swallow study ordered by Dr. Billie Ruddy)  Date of Provider Response 05/22/20  Time of Provider Response 1204  Document  Patient Outcome Other (Comment) (continuing to monitor)  Progress note created (see row info) Yes     05/22/20 1148  Assess: MEWS Score  Temp 97.7 F (36.5 C)  BP 115/76  Pulse Rate (!) 113  Resp (!) 24  Level of Consciousness Alert  SpO2 91 %  O2 Device Nasal Cannula  Patient Activity (if Appropriate) In chair  O2 Flow Rate (L/min) 4 L/min  Assess: MEWS Score  MEWS Temp 0  MEWS Systolic 0  MEWS Pulse 2  MEWS RR 1  MEWS LOC 0  MEWS Score 3  MEWS Score  Color Yellow  Assess: if the MEWS score is Yellow or Red  Were vital signs taken at a resting state? Yes  Focused Assessment Change from prior assessment (see assessment flowsheet)  Early Detection of Sepsis Score *See Row Information* Low  MEWS guidelines implemented *See Row Information* Yes  Treat  MEWS Interventions Other (Comment)  Pain Scale 0-10  Pain Score 0  Take Vital Signs  Increase Vital Sign Frequency  Yellow: Q 2hr X 2 then Q 4hr X 2, if remains yellow, continue Q 4hrs  Escalate  MEWS: Escalate Yellow: discuss with charge nurse/RN and consider discussing with provider and RRT  Notify: Charge Nurse/RN  Name of Charge Nurse/RN Notified [place], RN  Date Charge Nurse/RN Notified 05/22/20  Time Charge Nurse/RN Notified 1204  Notify: Provider  Provider Name/Title Dr. Billie Ruddy  Date Provider Notified 05/22/20  Time Provider Notified 1204  Notification Type Page  Notification Reason Change in status  Response Other (Comment) (swallow study ordered by Dr. Billie Ruddy)  Date of Provider Response 05/22/20  Time of Provider Response 7824  Document  Patient Outcome Other (Comment) (continuing to monitor)  Progress note created (see row info) Yes

## 2020-05-22 NOTE — Progress Notes (Signed)
Mobility Specialist - Progress Note   05/22/20 1300  Mobility  Activity Contraindicated/medical hold  Mobility performed by Mobility specialist     Mobility contraindicated d/t K+ levels reading at 5.3 currently. Will continue to monitor and attempt session when pt is medically appropriate.    Kathee Delton Mobility Specialist 05/22/20, 1:59 PM

## 2020-05-22 NOTE — Progress Notes (Addendum)
PROGRESS NOTE    Stephanie Collins  UJW:[id number] DOB: Dec 26, 1931 DOA: 04/30/2020 PCP: Baxter Hire, MD    Assessment & Plan:   Principal Problem:   HCAP (healthcare-associated pneumonia) Active Problems:   Chronic obstructive pulmonary disease ([place])   Hyponatremia   Sepsis ([place])   Chronic diastolic CHF (congestive heart failure) ([place])   Acute renal failure superimposed on stage 3a chronic kidney disease ([place])   UTI (urinary tract infection)   Elevated troponin   New onset atrial fibrillation ([place])   Acute metabolic encephalopathy   HTN (hypertension)   Sepsis: secondary to UTI & CAP.  Met criteria w/ leukocytosis, tachycardia, tachypnea.  --completed 5 days of vanc/cefepime  CAP: --on home 4L O2.  Reported having difficulty bringing up sputum --Strept is neg --completed 5 days of vanc/cefepime  UTI: urine cx is growing e.coli, proteus, both sensitive to cefepime. --completed 5 days of cefepime.  Acute on chronic COPD exacerbation Chronic hypoxic respiratory failure on 4L O2 baseline --In light of persistent dyspnea despite finishing PNA treatment, and having increased brown thick sputum, pt is having acute exacerbation of COPD. PLAN: --decrease solumedrol 40 mg BID --DuoNeb QID with chest PT --Mucomyst BID  # Severe dyspnea --started morphine 5 mg q6h for severe dyspnea and air hunger on 10/1, with improvement.  Dyspnea and work-of-breathing returned when morphine doses skipped. PLAN: --cont morphine 5 mg q6h scheduled --will taper down gradually  # Hyperkalemia --unclear etiology.  K didn't go down after Kayexalate x1 yesterday PLAN: --repeat Kayexalate today --Monitor K  Hx of HTN, not currently active Hypotension --BP has been 100's-110's --cont to hold home coreg and ACEi and lasix --caution IVF boluses due to dCHF  Hyponatremia, improved --encourage oral hydration.  Chronic diastolic CHF:  echo 5/62/1308 showed EF of 55 to 60% with grade II  diastolic dysfunction.   --cont to hold home lasix 20 mg due to intermittent low BP --caution IVF boluses  AKI on CKDIIIa: resolved  A. fib: new onset.  D/c IV heparin drip. Not on anticoagulation secondary to fall risk. Continue on tele. Cardio following and recs apprec   elevated troponins, likely secondary to demand ischemia & a. fib.   Acute metabolic encephalopathy, resolved likely multifactorial, including CAP, UTI & hypoxia. Re-orient prn   Discoloration of bilateral feet:  no ischemia as per vascular surg  Severe malnutrition in context of chronic illness --Ensure BID  Leukoplakia  --grey patches found under the tongue and on oral mucosa, painless. --No tx available.    DVT prophylaxis: Lovenox SQ Code Status: DNR    Family Communication:  Status is: inpatient Dispo:   The patient is from: home Anticipated d/c is to: SNF Anticipated d/c date is: 2-3 days Patient currently is not medically stable to d/c due to: still having dyspnea and rhonchi due to mucus and difficulty clearing, need chest PT, currently on IV solumedrol for COPD exacerbation.  Due to age, already advanced COPD, co-morbidities, frailty, pt is at high risk of decompensation.  Will get palliative care involved on Monday.   Consultants:   Cardio  vascular   Procedures:    Antimicrobials: vanco, cefepime    Subjective: 2 doses of scheduled morphine skipped due to pt appearing comfortable and pt refusing.   This morning, pt again had worse dyspnea and increased work-of-breathing.  Due to nursing concern for aspiration, SLP ordered.   Objective: Vitals:   05/21/20 2348 05/22/20 0302 05/22/20 0849 05/22/20 1148  BP: 105/73 114/79  115/76  Pulse: 96 (!) 103  (!) 113  Resp: (!) 22 20  (!) 24  Temp:  97.8 F (36.6 C)  97.7 F (36.5 C)  TempSrc:  Oral  Oral  SpO2: 99% 99% 92% 91%  Weight:      Height:        Intake/Output Summary (Last 24 hours) at 05/22/2020 1510 Last data  filed at 05/22/2020 1015 Gross per 24 hour  Intake 600 ml  Output 550 ml  Net 50 ml   Filed Weights   05/08/2020 1259 04/30/2020 1523  Weight: 43.5 kg 42.2 kg    Examination:  Constitutional: NAD, AAOx3, cachectic  HEENT: conjunctivae and lids normal, EOMI, mucosa dry, gray patches in oral mucosa CV: RRR no M,R,G. Distal pulses +2.  No cyanosis.   RESP: diffuse rhonchi GI: +BS, NTND Extremities: No effusions, edema in BLE SKIN: warm, dry and intact Neuro: II - XII grossly intact.  Sensation intact   Data Reviewed: I have personally reviewed following labs and imaging studies  CBC: Recent Labs  Lab 05/18/20 0358 05/19/20 0651 05/20/20 0859 05/21/20 0550 05/22/20 0504  WBC 13.9* 11.9* 7.9 8.7 8.3  HGB 10.7* 11.1* 11.4* 10.7* 10.4*  HCT 30.4* 32.0* 33.2* 32.5* 30.2*  MCV 84.9 80.8 86.7 83.3 87.3  PLT 323 324 305 330 272   Basic Metabolic Panel: Recent Labs  Lab 05/18/20 0358 05/19/20 0651 05/20/20 0426 05/21/20 0550 05/22/20 0504  NA 131* 131* 130* 129* 133*  K 4.1 4.1 4.4 5.6* 5.4*  CL 96* 98 98 96* 96*  CO2 $Re'26 24 24 26 'Ril$ 32  GLUCOSE 96 93 162* 156* 148*  BUN 50* 41* 49* 46* 44*  CREATININE 1.16* 1.03* 0.89 0.97 0.86  CALCIUM 8.3* 8.5* 8.9 9.2 8.7*  MG  --  2.0 2.1 2.1 2.2   GFR: Estimated Creatinine Clearance: 31.3 mL/min (by C-G formula based on SCr of 0.86 mg/dL). Liver Function Tests: No results for input(s): AST, ALT, ALKPHOS, BILITOT, PROT, ALBUMIN in the last 168 hours. No results for input(s): LIPASE, AMYLASE in the last 168 hours. No results for input(s): AMMONIA in the last 168 hours. Coagulation Profile: No results for input(s): INR, PROTIME in the last 168 hours. Cardiac Enzymes: No results for input(s): CKTOTAL, CKMB, CKMBINDEX, TROPONINI in the last 168 hours. BNP (last 3 results) No results for input(s): PROBNP in the last 8760 hours. HbA1C: No results for input(s): HGBA1C in the last 72 hours. CBG: No results for input(s): GLUCAP in the  last 168 hours. Lipid Profile: No results for input(s): CHOL, HDL, LDLCALC, TRIG, CHOLHDL, LDLDIRECT in the last 72 hours. Thyroid Function Tests: No results for input(s): TSH, T4TOTAL, FREET4, T3FREE, THYROIDAB in the last 72 hours. Anemia Panel: No results for input(s): VITAMINB12, FOLATE, FERRITIN, TIBC, IRON, RETICCTPCT in the last 72 hours. Sepsis Labs: No results for input(s): PROCALCITON, LATICACIDVEN in the last 168 hours.  Recent Results (from the past 240 hour(s))  Urine Culture     Status: Abnormal   Collection Time: 04/20/2020 12:32 PM   Specimen: Urine, Random  Result Value Ref Range Status   Specimen Description   Final    URINE, RANDOM Performed at [facility], [address], [place], [place] [address]    Special Requests   Final    NONE Performed at [facility], [address] [place], [place] [address]    Culture (A)  Final    >=100,000 COLONIES/mL ESCHERICHIA COLI >=100,000 COLONIES/mL PROTEUS MIRABILIS    Report  Status 05/19/2020 FINAL  Final   Organism ID, Bacteria ESCHERICHIA COLI (A)  Final   Organism ID, Bacteria PROTEUS MIRABILIS (A)  Final      Susceptibility   Escherichia coli - MIC*    AMPICILLIN 4 SENSITIVE Sensitive     CEFAZOLIN <=4 SENSITIVE Sensitive     CEFTRIAXONE <=0.25 SENSITIVE Sensitive     CIPROFLOXACIN <=0.25 SENSITIVE Sensitive     GENTAMICIN <=1 SENSITIVE Sensitive     IMIPENEM <=0.25 SENSITIVE Sensitive     NITROFURANTOIN <=16 SENSITIVE Sensitive     TRIMETH/SULFA <=20 SENSITIVE Sensitive     AMPICILLIN/SULBACTAM <=2 SENSITIVE Sensitive     PIP/TAZO <=4 SENSITIVE Sensitive     * >=100,000 COLONIES/mL ESCHERICHIA COLI   Proteus mirabilis - MIC*    AMPICILLIN <=2 SENSITIVE Sensitive     CEFAZOLIN <=4 SENSITIVE Sensitive     CEFTRIAXONE <=0.25 SENSITIVE Sensitive     CIPROFLOXACIN <=0.25 SENSITIVE Sensitive     GENTAMICIN <=1 SENSITIVE Sensitive     IMIPENEM 1 SENSITIVE Sensitive     NITROFURANTOIN  RESISTANT Resistant     TRIMETH/SULFA <=20 SENSITIVE Sensitive     AMPICILLIN/SULBACTAM <=2 SENSITIVE Sensitive     PIP/TAZO <=4 SENSITIVE Sensitive     * >=100,000 COLONIES/mL PROTEUS MIRABILIS  Blood culture (routine x 2)     Status: None   Collection Time: 05/06/2020  2:06 PM   Specimen: BLOOD  Result Value Ref Range Status   Specimen Description BLOOD LEFT FORE ARM  Final   Special Requests   Final    BOTTLES DRAWN AEROBIC AND ANAEROBIC Blood Culture adequate volume   Culture   Final    NO GROWTH 5 DAYS Performed at [facility], [address] [place], [place] [address]    Report Status 05/19/2020 FINAL  Final  Blood culture (routine x 2)     Status: None   Collection Time: 04/30/2020  2:06 PM   Specimen: BLOOD  Result Value Ref Range Status   Specimen Description BLOOD RIGHT AC  Final   Special Requests   Final    BOTTLES DRAWN AEROBIC AND ANAEROBIC Blood Culture results may not be optimal due to an excessive volume of blood received in culture bottles   Culture   Final    NO GROWTH 5 DAYS Performed at [facility], [address] [place], [place] [address]    Report Status 05/19/2020 FINAL  Final  Respiratory Panel by RT PCR (Flu A&B, Covid) - Nasopharyngeal Swab     Status: None   Collection Time: 04/24/2020  2:06 PM   Specimen: Nasopharyngeal Swab  Result Value Ref Range Status   SARS Coronavirus 2 by RT PCR NEGATIVE NEGATIVE Final    Comment: (NOTE) SARS-CoV-2 target nucleic acids are NOT DETECTED.  The SARS-CoV-2 RNA is generally detectable in upper respiratoy specimens during the acute phase of infection. The lowest concentration of SARS-CoV-2 viral copies this assay can detect is 131 copies/mL. A negative result does not preclude SARS-Cov-2 infection and should not be used as the sole basis for treatment or other patient management decisions. A negative result may occur with  improper specimen collection/handling, submission of specimen  other than nasopharyngeal swab, presence of viral mutation(s) within the areas targeted by this assay, and inadequate number of viral copies (<131 copies/mL). A negative result must be combined with clinical observations, patient history, and epidemiological information. The expected result is Negative.  Fact Sheet for Patients:  https://www.moore.com/  Fact Sheet for  Healthcare Providers:  GravelBags.it  This test is no t yet approved or cleared by the [place] and  has been authorized for detection and/or diagnosis of SARS-CoV-2 by FDA under an Emergency Use Authorization (EUA). This EUA will remain  in effect (meaning this test can be used) for the duration of the COVID-19 declaration under Section 564(b)(1) of the Act, 21 U.S.C. section 360bbb-3(b)(1), unless the authorization is terminated or revoked sooner.     Influenza A by PCR NEGATIVE NEGATIVE Final   Influenza B by PCR NEGATIVE NEGATIVE Final    Comment: (NOTE) The Xpert Xpress SARS-CoV-2/FLU/RSV assay is intended as an aid in  the diagnosis of influenza from Nasopharyngeal swab specimens and  should not be used as a sole basis for treatment. Nasal washings and  aspirates are unacceptable for Xpert Xpress SARS-CoV-2/FLU/RSV  testing.  Fact Sheet for Patients: PinkCheek.be  Fact Sheet for Healthcare Providers: GravelBags.it  This test is not yet approved or cleared by the [place] FDA and  has been authorized for detection and/or diagnosis of SARS-CoV-2 by  FDA under an Emergency Use Authorization (EUA). This EUA will remain  in effect (meaning this test can be used) for the duration of the  Covid-19 declaration under Section 564(b)(1) of the Act, 21  U.S.C. section 360bbb-3(b)(1), unless the authorization is  terminated or revoked. Performed at [facility], [address]  [place], [place] [address]   MRSA PCR Screening     Status: Abnormal   Collection Time: 05/13/2020  8:14 PM   Specimen: Nasopharyngeal  Result Value Ref Range Status   MRSA by PCR POSITIVE (A) NEGATIVE Final    Comment:        The GeneXpert MRSA Assay (FDA approved for NASAL specimens only), is one component of a comprehensive MRSA colonization surveillance program. It is not intended to diagnose MRSA infection nor to guide or monitor treatment for MRSA infections. CRITICAL RESULT CALLED TO, READ BACK BY AND VERIFIED WITH: MEGAN GRAF $[email or web address]$  05/13/2020 TTG Performed at [facility], [address] [place], [place] [address]          Radiology Studies: [facility] Chest [facility] 1 View  Result Date: 05/20/2020 CLINICAL DATA:  Shortness of breath x2 weeks. EXAM: PORTABLE CHEST 1 VIEW COMPARISON:  May 14, 2020 FINDINGS: Moderate severity stable diffusely increased interstitial lung markings are noted. Mild, stable bibasilar atelectasis is seen, left greater than right. There are small, stable bilateral pleural effusions. No pneumothorax is identified. There is mild prominence of the suprahilar pulmonary vasculature. The cardiac silhouette is markedly enlarged and unchanged in size. Degenerative changes seen throughout the thoracic spine. IMPRESSION: 1. Stable cardiomegaly and mild pulmonary vascular congestion. 2. Stable small bilateral pleural effusions. 3. Stable bibasilar atelectasis, left greater than right. Electronically Signed   By: Virgina Norfolk M.D.   On: 05/20/2020 15:47        Scheduled Meds: . acetylcysteine  3 mL Nebulization BID  . aspirin EC  81 mg Oral Daily  . atorvastatin  40 mg Oral Daily  . cholecalciferol  2,000 Units Oral Daily  . enoxaparin (LOVENOX) injection  30 mg Subcutaneous Q24H  . feeding supplement (ENSURE ENLIVE)  237 mL Oral BID BM  . guaiFENesin  600 mg Oral BID  . ipratropium-albuterol  3 mL Nebulization QID  . mouth rinse  15 mL Mouth Rinse  BID  . methylPREDNISolone (SOLU-MEDROL) injection  40 mg Intravenous Q12H  . morphine  5 mg Oral Q6H  Continuous Infusions:    LOS: 8 days    Enzo Bi, MD [facility] Pager [phone]  If 7PM-7AM, please contact night-coverage [email or web address] 05/22/2020, 3:10 PM

## 2020-05-22 NOTE — Plan of Care (Signed)
Continuing with plan of care. 

## 2020-05-23 DIAGNOSIS — J189 Pneumonia, unspecified organism: Secondary | ICD-10-CM | POA: Diagnosis not present

## 2020-05-23 LAB — BASIC METABOLIC PANEL
Anion gap: 9 (ref 5–15)
BUN: 46 mg/dL — ABNORMAL HIGH (ref 8–23)
CO2: 29 mmol/L (ref 22–32)
Calcium: 8.7 mg/dL — ABNORMAL LOW (ref 8.9–10.3)
Chloride: 96 mmol/L — ABNORMAL LOW (ref 98–111)
Creatinine, Ser: 0.78 mg/dL (ref 0.44–1.00)
GFR calc Af Amer: >60 mL/min (ref >60)
GFR calc non Af Amer: >60 mL/min (ref >60)
Glucose, Bld: 138 mg/dL — ABNORMAL HIGH (ref 70–99)
Potassium: 4.9 mmol/L (ref 3.5–5.1)
Sodium: 134 mmol/L — ABNORMAL LOW (ref 135–145)

## 2020-05-23 LAB — CBC
HCT: 30.3 % — ABNORMAL LOW (ref 36.0–46.0)
Hemoglobin: 11 g/dL — ABNORMAL LOW (ref 12.0–15.0)
MCH: 33.5 pg (ref 26.0–34.0)
MCHC: 36.3 g/dL — ABNORMAL HIGH (ref 30.0–36.0)
MCV: 92.4 fL (ref 80.0–100.0)
Platelets: 330 10*3/uL (ref 150–400)
RBC: 3.28 MIL/uL — ABNORMAL LOW (ref 3.87–5.11)
RDW: 20.5 % — ABNORMAL HIGH (ref 11.5–15.5)
WBC: 9.1 10*3/uL (ref 4.0–10.5)
nRBC: 0 % (ref 0.0–0.2)

## 2020-05-23 LAB — MAGNESIUM: Magnesium: 2.3 mg/dL (ref 1.7–2.4)

## 2020-05-23 MED ORDER — ADULT MULTIVITAMIN W/MINERALS CH
1.0000 | ORAL_TABLET | Freq: Every day | ORAL | Status: DC
  Administered 2020-05-24: 1 via ORAL
  Filled 2020-05-23: qty 1

## 2020-05-23 MED ORDER — POLYETHYLENE GLYCOL 3350 17 G PO PACK
34.0000 g | PACK | Freq: Two times a day (BID) | ORAL | Status: DC
  Administered 2020-05-23 – 2020-05-24 (×3): 34 g via ORAL
  Filled 2020-05-23 (×3): qty 2

## 2020-05-23 MED ORDER — MORPHINE SULFATE 10 MG/5ML PO SOLN
4.0000 mg | Freq: Four times a day (QID) | ORAL | Status: DC
  Administered 2020-05-23 – 2020-05-24 (×2): 4 mg via ORAL
  Filled 2020-05-23 (×3): qty 5

## 2020-05-23 NOTE — Progress Notes (Addendum)
PROGRESS NOTE    Stephanie Collins  FXT:[id number] DOB: 05/15/1932 DOA: 05/02/2020 PCP: Baxter Hire, MD    Assessment & Plan:   Principal Problem:   HCAP (healthcare-associated pneumonia) Active Problems:   Chronic obstructive pulmonary disease ([place])   Hyponatremia   Sepsis ([place])   Chronic diastolic CHF (congestive heart failure) ([place])   Acute renal failure superimposed on stage 3a chronic kidney disease ([place])   UTI (urinary tract infection)   Elevated troponin   New onset atrial fibrillation ([place])   Acute metabolic encephalopathy   HTN (hypertension)   Sepsis: secondary to UTI & CAP.  Met criteria w/ leukocytosis, tachycardia, tachypnea.  --completed 5 days of vanc/cefepime  CAP: --on home 4L O2.  Reported having difficulty bringing up sputum --Strept is neg --completed 5 days of vanc/cefepime  UTI: urine cx is growing e.coli, proteus, both sensitive to cefepime. --completed 5 days of cefepime.  Acute on chronic COPD exacerbation Chronic hypoxic respiratory failure on 4L O2 baseline --In light of persistent dyspnea despite finishing PNA treatment, and having increased brown thick sputum, pt is having acute exacerbation of COPD. PLAN: --continue solumedrol 40 mg BID --DuoNeb QID with chest PT --Mucomyst BID --palliative consult today  # Severe dyspnea --started morphine 5 mg q6h for severe dyspnea and air hunger on 10/1, with improvement.  Dyspnea and work-of-breathing returned when morphine doses skipped. PLAN: --taper down to 4 mg q6h --palliative consult today  # Hyperkalemia --unclear etiology.   --s/p Kayexalate x2 PLAN: --monitor K  Hx of HTN, not currently active Hypotension --BP has been 100's-110's --cont to hold home coreg and ACEi and lasix --caution IVF boluses due to dCHF  Hyponatremia, improved --encourage oral hydration.  Chronic diastolic CHF:  echo 2/99/2426 showed EF of 55 to 60% with grade II diastolic dysfunction.   --cont to  hold home lasix 20 mg due to intermittent low BP --caution IVF boluses  AKI on CKDIIIa: resolved  A. fib: new onset.  D/c IV heparin drip. Not on anticoagulation secondary to fall risk. Continue on tele. Cardio following and recs apprec   elevated troponins, likely secondary to demand ischemia & a. fib.   Acute metabolic encephalopathy, resolved likely multifactorial, including CAP, UTI & hypoxia. Re-orient prn   Discoloration of bilateral feet:  no ischemia as per vascular surg  Severe malnutrition in context of chronic illness --Ensure BID  Large esophageal hiatal hernia --at increased risk for aspiration of reflux material --SLP eval today   Leukoplakia  --grey patches found under the tongue and on oral mucosa, painless. --No tx available.    DVT prophylaxis: Lovenox SQ Code Status: DNR    Family Communication: daughter updated at the bedside today Status is: inpatient Dispo:   The patient is from: home Anticipated d/c is to: SNF Anticipated d/c date is: 2-3 days Patient currently is not medically stable to d/c due to: still having dyspnea and rhonchi due to mucus and difficulty clearing, need chest PT, currently on IV solumedrol for COPD exacerbation.  Due to age, already advanced COPD, co-morbidities, frailty, pt is at high risk of decompensation.  Will get palliative care involved on Monday.   Consultants:   Cardio  vascular   Procedures:    Antimicrobials: vanco, cefepime    Subjective: Pt appeared more comfortable today.  Haven't had BM in many days.  SLP eval today.     Objective: Vitals:   05/23/20 0448 05/23/20 0737 05/23/20 0755 05/23/20 1212  BP: 122/89 (!) 124/91  Marland Kitchen)  111/95  Pulse: 99 100  (!) 106  Resp: _0 Temp: 98.4 F (36.9 C) 97.6 F (36.4 C)  97.6 F (36.4 C)  TempSrc:  Oral  Oral  SpO2: 100% 100% 100% 97%  Weight:      Height:        Intake/Output Summary (Last 24 hours) at 05/23/2020 1528 Last data filed at  05/23/2020 1345 Gross per 24 hour  Intake 810 ml  Output 945 ml  Net -135 ml   Filed Weights   04/23/2020 1259 05/03/2020 1523  Weight: 43.5 kg 42.2 kg    Examination:  Constitutional: NAD, AAOx3, cachectic  HEENT: conjunctivae and lids normal, EOMI CV: RRR no M,R,G. Distal pulses +2.  No cyanosis.   RESP: lung sounds more clear, on 4L, normal work of breathing GI: +BS, NTND Extremities: No effusions, edema in BLE SKIN: warm, dry and intact Neuro: II - XII grossly intact.  Sensation intact   Data Reviewed: I have personally reviewed following labs and imaging studies  CBC: Recent Labs  Lab 05/19/20 0651 05/20/20 0859 05/21/20 0550 05/22/20 0504 05/23/20 0433  WBC 11.9* 7.9 8.7 8.3 9.1  HGB 11.1* 11.4* 10.7* 10.4* 11.0*  HCT 32.0* 33.2* 32.5* 30.2* 30.3*  MCV 80.8 86.7 83.3 87.3 92.4  PLT 324 305 330 331 536   Basic Metabolic Panel: Recent Labs  Lab 05/19/20 0651 05/20/20 0426 05/21/20 0550 05/22/20 0504 05/23/20 0433  NA 131* 130* 129* 133* 134*  K 4.1 4.4 5.6* 5.4* 4.9  CL 98 98 96* 96* 96*  CO2 _1 32 29  GLUCOSE 93 162* 156* 148* 138*  BUN 41* 49* 46* 44* 46*  CREATININE 1.03* 0.89 0.97 0.86 0.78  CALCIUM 8.5* 8.9 9.2 8.7* 8.7*  MG 2.0 2.1 2.1 2.2 2.3   GFR: Estimated Creatinine Clearance: 33.6 mL/min (by C-G formula based on SCr of 0.78 mg/dL). Liver Function Tests: No results for input(s): AST, ALT, ALKPHOS, BILITOT, PROT, ALBUMIN in the last 168 hours. No results for input(s): LIPASE, AMYLASE in the last 168 hours. No results for input(s): AMMONIA in the last 168 hours. Coagulation Profile: No results for input(s): INR, PROTIME in the last 168 hours. Cardiac Enzymes: No results for input(s): CKTOTAL, CKMB, CKMBINDEX, TROPONINI in the last 168 hours. BNP (last 3 results) No results for input(s): PROBNP in the last 8760 hours. HbA1C: No results for input(s): HGBA1C in the last 72 hours. CBG: No results for input(s): GLUCAP in the last 168  hours. Lipid Profile: No results for input(s): CHOL, HDL, LDLCALC, TRIG, CHOLHDL, LDLDIRECT in the last 72 hours. Thyroid Function Tests: No results for input(s): TSH, T4TOTAL, FREET4, T3FREE, THYROIDAB in the last 72 hours. Anemia Panel: No results for input(s): VITAMINB12, FOLATE, FERRITIN, TIBC, IRON, RETICCTPCT in the last 72 hours. Sepsis Labs: No results for input(s): PROCALCITON, LATICACIDVEN in the last 168 hours.  Recent Results (from the past 240 hour(s))  Urine Culture     Status: Abnormal   Collection Time: 04/24/2020 12:32 PM   Specimen: Urine, Random  Result Value Ref Range Status   Specimen Description   Final    URINE, RANDOM Performed at [facility], [address], [place], [place] [address]    Special Requests   Final    NONE Performed at [facility], [address] [place],  [address]    Culture (A)  Final    >=100,000 COLONIES/mL ESCHERICHIA COLI >=100,000 COLONIES/mL PROTEUS MIRABILIS  Report Status 05/19/2020 FINAL  Final   Organism ID, Bacteria ESCHERICHIA COLI (A)  Final   Organism ID, Bacteria PROTEUS MIRABILIS (A)  Final      Susceptibility   Escherichia coli - MIC*    AMPICILLIN 4 SENSITIVE Sensitive     CEFAZOLIN <=4 SENSITIVE Sensitive     CEFTRIAXONE <=0.25 SENSITIVE Sensitive     CIPROFLOXACIN <=0.25 SENSITIVE Sensitive     GENTAMICIN <=1 SENSITIVE Sensitive     IMIPENEM <=0.25 SENSITIVE Sensitive     NITROFURANTOIN <=16 SENSITIVE Sensitive     TRIMETH/SULFA <=20 SENSITIVE Sensitive     AMPICILLIN/SULBACTAM <=2 SENSITIVE Sensitive     PIP/TAZO <=4 SENSITIVE Sensitive     * >=100,000 COLONIES/mL ESCHERICHIA COLI   Proteus mirabilis - MIC*    AMPICILLIN <=2 SENSITIVE Sensitive     CEFAZOLIN <=4 SENSITIVE Sensitive     CEFTRIAXONE <=0.25 SENSITIVE Sensitive     CIPROFLOXACIN <=0.25 SENSITIVE Sensitive     GENTAMICIN <=1 SENSITIVE Sensitive     IMIPENEM 1 SENSITIVE Sensitive     NITROFURANTOIN RESISTANT  Resistant     TRIMETH/SULFA <=20 SENSITIVE Sensitive     AMPICILLIN/SULBACTAM <=2 SENSITIVE Sensitive     PIP/TAZO <=4 SENSITIVE Sensitive     * >=100,000 COLONIES/mL PROTEUS MIRABILIS  Blood culture (routine x 2)     Status: None   Collection Time: 04/23/2020  2:06 PM   Specimen: BLOOD  Result Value Ref Range Status   Specimen Description BLOOD LEFT FORE ARM  Final   Special Requests   Final    BOTTLES DRAWN AEROBIC AND ANAEROBIC Blood Culture adequate volume   Culture   Final    NO GROWTH 5 DAYS Performed at [facility], [address] [place], [place] [address]    Report Status 05/19/2020 FINAL  Final  Blood culture (routine x 2)     Status: None   Collection Time: 05/19/2020  2:06 PM   Specimen: BLOOD  Result Value Ref Range Status   Specimen Description BLOOD RIGHT AC  Final   Special Requests   Final    BOTTLES DRAWN AEROBIC AND ANAEROBIC Blood Culture results may not be optimal due to an excessive volume of blood received in culture bottles   Culture   Final    NO GROWTH 5 DAYS Performed at [facility], [address] [place], [place] [address]    Report Status 05/19/2020 FINAL  Final  Respiratory Panel by RT PCR (Flu A&B, Covid) - Nasopharyngeal Swab     Status: None   Collection Time: 04/29/2020  2:06 PM   Specimen: Nasopharyngeal Swab  Result Value Ref Range Status   SARS Coronavirus 2 by RT PCR NEGATIVE NEGATIVE Final    Comment: (NOTE) SARS-CoV-2 target nucleic acids are NOT DETECTED.  The SARS-CoV-2 RNA is generally detectable in upper respiratoy specimens during the acute phase of infection. The lowest concentration of SARS-CoV-2 viral copies this assay can detect is 131 copies/mL. A negative result does not preclude SARS-Cov-2 infection and should not be used as the sole basis for treatment or other patient management decisions. A negative result may occur with  improper specimen collection/handling, submission of specimen other than  nasopharyngeal swab, presence of viral mutation(s) within the areas targeted by this assay, and inadequate number of viral copies (<131 copies/mL). A negative result must be combined with clinical observations, patient history, and epidemiological information. The expected result is Negative.  Fact Sheet for Patients:  PinkCheek.be  Fact Sheet  for Healthcare Providers:  GravelBags.it  This test is no t yet approved or cleared by the [place] and  has been authorized for detection and/or diagnosis of SARS-CoV-2 by FDA under an Emergency Use Authorization (EUA). This EUA will remain  in effect (meaning this test can be used) for the duration of the COVID-19 declaration under Section 564(b)(1) of the Act, 21 U.S.C. section 360bbb-3(b)(1), unless the authorization is terminated or revoked sooner.     Influenza A by PCR NEGATIVE NEGATIVE Final   Influenza B by PCR NEGATIVE NEGATIVE Final    Comment: (NOTE) The Xpert Xpress SARS-CoV-2/FLU/RSV assay is intended as an aid in  the diagnosis of influenza from Nasopharyngeal swab specimens and  should not be used as a sole basis for treatment. Nasal washings and  aspirates are unacceptable for Xpert Xpress SARS-CoV-2/FLU/RSV  testing.  Fact Sheet for Patients: PinkCheek.be  Fact Sheet for Healthcare Providers: GravelBags.it  This test is not yet approved or cleared by the [place] FDA and  has been authorized for detection and/or diagnosis of SARS-CoV-2 by  FDA under an Emergency Use Authorization (EUA). This EUA will remain  in effect (meaning this test can be used) for the duration of the  Covid-19 declaration under Section 564(b)(1) of the Act, 21  U.S.C. section 360bbb-3(b)(1), unless the authorization is  terminated or revoked. Performed at [facility], [address] [place], [place]  [address]   MRSA PCR Screening     Status: Abnormal   Collection Time: 04/22/2020  8:14 PM   Specimen: Nasopharyngeal  Result Value Ref Range Status   MRSA by PCR POSITIVE (A) NEGATIVE Final    Comment:        The GeneXpert MRSA Assay (FDA approved for NASAL specimens only), is one component of a comprehensive MRSA colonization surveillance program. It is not intended to diagnose MRSA infection nor to guide or monitor treatment for MRSA infections. CRITICAL RESULT CALLED TO, READ BACK BY AND VERIFIED WITH: MEGAN GRAF _0  05/13/2020 TTG Performed at [facility], [address] [place], [place] [address]          Radiology Studies: No results found.      Scheduled Meds: . acetylcysteine  3 mL Nebulization BID  . aspirin EC  81 mg Oral Daily  . atorvastatin  40 mg Oral Daily  . cholecalciferol  2,000 Units Oral Daily  . enoxaparin (LOVENOX) injection  30 mg Subcutaneous Q24H  . feeding supplement (ENSURE ENLIVE)  237 mL Oral BID BM  . guaiFENesin  600 mg Oral BID  . ipratropium-albuterol  3 mL Nebulization QID  . mouth rinse  15 mL Mouth Rinse BID  . methylPREDNISolone (SOLU-MEDROL) injection  40 mg Intravenous Q12H  . morphine  5 mg Oral Q6H  . [START ON 05/24/2020] multivitamin with minerals  1 tablet Oral Daily  . polyethylene glycol  34 g Oral BID   Continuous Infusions:    LOS: 9 days    Enzo Bi, MD [facility] Pager [phone]  If 7PM-7AM, please contact night-coverage [email or web address] 05/23/2020, 3:28 PM

## 2020-05-23 NOTE — Progress Notes (Signed)
Mobility Specialist - Progress Note   05/23/20 1638  Mobility  Activity Transferred:  Chair to bed  Level of Assistance Moderate assist, patient does 50-74% (+2 assist for S2S)  Assistive Device Front wheel walker  Distance Ambulated (ft) 3 ft  Mobility Response Tolerated fair  Mobility performed by Mobility specialist  $Mobility charge 1 Mobility    Pt sitting on recliner w/ ST present in the room upon arrival. Pt agreed to session. Pt currently utilizing 3L O2 [place]. Pt attempted to S2S x 2, but failed to stand both times d/t feeling weak. Pt states "I'm too weak to stand up". Noted SOB and O2 desat after S2S attempts. O2 desat to low 80s during S2S x 2. IV Nurse entered the room, was notified of how pt was feeling. IV Nurse requested mobility specialist help her transfer pt from chair to bed for IV insertion. Pt needed +2 assist to S2S. Pt able to transfer w/ mod. Assist +1 for steadying and safety. Pt needed mod. Assist to transition from sit to supine. Overall, pt tolerated session fair. Pt left laying in bed w/ IV Nurse present in room. Nurse was notified.     Angelee Bahr Mobility Specialist  05/23/20, 4:43 PM

## 2020-05-23 NOTE — Care Management Important Message (Signed)
Important Message  Patient Details  Name: Stephanie Collins MRN: [id number] Date of Birth: 1932/02/15   Medicare Important Message Given:  Yes     Dannette Barbara 05/23/2020, 12:59 PM

## 2020-05-23 NOTE — Evaluation (Signed)
Clinical/Bedside Swallow Evaluation Patient Details  Name: Stephanie Collins MRN: [id number] Date of Birth: August 06, 1932  Today's Date: 05/23/2020 Time: SLP Start Time (ACUTE ONLY): 60 SLP Stop Time (ACUTE ONLY): 1610 SLP Time Calculation (min) (ACUTE ONLY): 60 min  Past Medical History:  Past Medical History:  Diagnosis Date  . Cancer ([place]) 2010   lung  . Cough 01/19/2016  . Hypertension   . Lung cancer ([place]) 01/19/2016   Past Surgical History:  Past Surgical History:  Procedure Laterality Date  . BREAST BIOPSY Left 2011   benign  . KYPHOPLASTY N/A 02/01/2015   Procedure: KYPHOPLASTY;  Surgeon: Hessie Knows, MD;  Location: [facility] ORS;  Service: Orthopedics;  Laterality: N/A;  T10   HPI:  Pt is a 86 y.o. female with medical history significant of hypertension, COPD on 2 L oxygen, lung cancer, dCHF, CKD stage III, hyponatremia, who presents with shortness of breath, diarrhea, discoloration of bilateral feet, increased urinary frequency, confusion, generalized weakness.  Per patient's daughter, patient normally is alert, oriented x3.  In the past 2 days, patient has been confused.  She is not oriented to time, but is still oriented to place and person.  She has shortness of breath which has been progressively worsening.  Denies chest pain, fever or chills.  She has lost 6 pounds recently.  Patient has had at least 8 times of watery diarrhea since yesterday.  Pt has dx'd Acute on chronic COPD exacerbation, Chronic hypoxic respiratory failure on 4L O2 baseline, Severe Dyspnea.  Pt has a Baseline dx of Hiatal Hernia per pt/Dtr report which causes Esophageal Dysmotility w/ po intake for pt.  CT of Chest on 03/22/2020: "Esophageal hiatal hernia, large. Much of the stomach in the chest, possibly a paraesophageal hernia".   Assessment / Plan / Recommendation Clinical Impression  Pt appears to present w/ adequate oropharyngeal phase swallowing function w/ No overt oropharyngeal phase dysphagia appreciated; No  neuromuscular swallowing deficits appreciated. Pt is at reduced risk for aspiration from an oropharyngeal phase standpoint when following general aspiration precautions. Pt has a baseline of a "Large" Hiatal Hernia per Chest CT -- "Esophageal hiatal hernia, large. Much of the stomach in the chest, possibly a paraesophageal hernia". ANY Dysmotility or Retrograde flow of Reflux material can increase risk for aspiration of the Reflux material during backflow thus impact Pulmonary status and increase risk for pneumonia. Pt described ongoing issues w/ discomfort in the mid-upper chest and Inability to take full meals -- "I have not been eating" was pt's statement to SLP. No recent regurgitation but often the discomfort of delayed, dry cough ~1 hour AFTER meals.  For this eval, pt fed self consuming few trials of thin liquids via Straw, then purees/soft solids w/ no immediate, overt clinical s/s of aspiration noted; clear vocal quality b/t trials, no consistent, sustained decline in respiratory effort, no multiple swallows noted post initial pharyngeal swallow. Min increased WOB calmed w/ time b/t trials -- pt had just finished a Breathing Tx w/ RT prior to this eval. Oral phase appeared [facility] for bolus management and timely A-P transfer/clearing of boluses of puree and liquid materials; min increased Time for mashing/mastication of softened solids d/t not wearing her Upper Denture plate(pt stated she doesn't wear it much anymore). OM exam was [facility] for lingual/labial movements. No unilateral weakness. Speech clear.  Recommend a more Mech Soft diet for ease of choice of manageable foods as tolerates(w/ less meats/breads in diet, more Minced meats and moistened foods including Purees) w/ thin  liquids via Cup(less straw use d/t air swallowing); less Carbonated drinks d/t increased air; general aspiration precautions. Rest Breaks during meals/oral intake to allow for Esophageal clearing and calming of breathing if SOB. STRICT  REFLUX Precautions strongly recommended to lessen chance for Regurgitation.  Recommend pt f/u w/ GI for further Esophageal phase management and education re: her chronic Esophageal issues. A Palliative Care consult would be recommended d/t the chronic nature of her medical issues impacting oral intake. NSG updated and will reconsult ST services if any new needs while admitted. Much Time was spent w/ pt(family last assessment in 03/2020) re: education; discussion of foods/meals; Handouts given on Esophageal phase dysmotility recommendations. Pt appears at her Baseline.  SLP Visit Diagnosis: Dysphagia, pharyngoesophageal phase (R13.14);Dysphagia, unspecified (R13.10) (Esophageal dysmotility)    Aspiration Risk  Mild aspiration risk;Risk for inadequate nutrition/hydration (GI issues)    Diet Recommendation  Mech Soft diet w/ chopped/minced meats, gravy to moisten; Soups at meals; more Pureed foods; Thin liquids. General aspiration precautions. STRICT REFLUX PRECAUTIONS.  Medication Administration: Crushed with puree (for ease of Pills clearing the Esophagus)    Other  Recommendations Recommended Consults: Consider GI evaluation;Consider esophageal assessment (Education needed; Palliative Care consult) Oral Care Recommendations: Oral care BID;Oral care before and after PO;Staff/trained caregiver to provide oral care Other Recommendations:  (n/a)   Follow up Recommendations None (f/u w/ GI for formal assessment, ed, and recs.)      Frequency and Duration  (n/a)   (n/a)       Prognosis Prognosis for Safe Diet Advancement: Guarded (-Fair) Barriers to Reach Goals: Time post onset;Severity of deficits (Esophageal dysmotility) Barriers/Prognosis Comment: Gi dysmotility      Swallow Study   General Date of Onset: 05/10/2020 HPI: Pt is a 86 y.o. female with medical history significant of hypertension, COPD on 2 L oxygen, lung cancer, dCHF, CKD stage III, hyponatremia, who presents with shortness of  breath, diarrhea, discoloration of bilateral feet, increased urinary frequency, confusion, generalized weakness.  Per patient's daughter, patient normally is alert, oriented x3.  In the past 2 days, patient has been confused.  She is not oriented to time, but is still oriented to place and person.  She has shortness of breath which has been progressively worsening.  Denies chest pain, fever or chills.  She has lost 6 pounds recently.  Patient has had at least 8 times of watery diarrhea since yesterday.  Pt has dx'd Acute on chronic COPD exacerbation, Chronic hypoxic respiratory failure on 4L O2 baseline, Severe Dyspnea.  Pt has a Baseline dx of Hiatal Hernia per pt/Dtr report which causes Esophageal Dysmotility w/ po intake for pt.  CT of Chest on 03/22/2020: "Esophageal hiatal hernia, large. Much of the stomach in the chest, possibly a paraesophageal hernia". Type of Study: Bedside Swallow Evaluation Previous Swallow Assessment: 03/25/2020 Diet Prior to this Study: Regular;Thin liquids Temperature Spikes Noted: No (wbc 9.1) Respiratory Status: Nasal cannula (4L) History of Recent Intubation: No Behavior/Cognition: Alert;Cooperative;Pleasant mood (min HOH) Oral Cavity Assessment: Within Functional Limits Oral Care Completed by SLP: Yes Oral Cavity - Dentition: Dentures, top;Missing dentition (some native Dentition on bottom; not wearing the uppers) Vision: Functional for self-feeding Self-Feeding Abilities: Able to feed self;Needs assist;Needs set up Patient Positioning: Upright in chair Baseline Vocal Quality: Normal Volitional Cough: Congested Volitional Swallow: Able to elicit    Oral/Motor/Sensory Function Overall Oral Motor/Sensory Function: Within functional limits   Ice Chips Ice chips: Within functional limits Presentation: Spoon (fed; 2 trials)   Thin Liquid  Thin Liquid: Within functional limits Presentation: Self Fed;Straw (4 trials)    Nectar Thick Nectar Thick Liquid: Not tested    Honey Thick Honey Thick Liquid: Not tested   Puree Puree: Within functional limits Presentation: Spoon;Self Fed (8 trials)   Solid     Solid: Impaired (missing upper dentition; broken down solids) Presentation: Spoon;Self Fed (2 trials) Oral Phase Impairments: Impaired mastication Oral Phase Functional Implications: Impaired mastication Pharyngeal Phase Impairments:  (none)        Orinda Kenner, MS, [employer] [facility] [phone] Zadia Uhde 05/23/2020,4:49 PM

## 2020-05-23 NOTE — Progress Notes (Signed)
Initial Nutrition Assessment  DOCUMENTATION CODES:   Severe malnutrition in context of chronic illness  INTERVENTION:   Ensure Enlive po BID, each supplement provides 350 kcal and 20 grams of protein  MVI daily   Liberalize diet   NUTRITION DIAGNOSIS:   Severe Malnutrition related to chronic illness (COPD, Lung cancer) as evidenced by severe muscle depletion, severe fat depletion.  GOAL:   Patient will meet greater than or equal to 90% of their needs  MONITOR:   PO intake, Supplement acceptance, Labs, Weight trends, Skin, I & O's  REASON FOR ASSESSMENT:   Malnutrition Screening Tool    ASSESSMENT:   86 y.o. female with medical history significant of hypertension, COPD on 2 L oxygen, lung cancer, dCHF, CKD stage III, hyponatremia, who presents with shortness of breath, diarrhea, discoloration of bilateral feet, increased urinary frequency, confusion, generalized weakness. Pt found to have HCAP and UTI   Pt reports poor appetite and oral intake at baseline. Pt reports that she does drink chocolate protein supplements at home but she does not remember what brand (usually drinks 1-2 per day). Pt currently eating anywhere from 30-100% of meals in hospital and is drinking 1-2 Ensure supplements. Per chart, pt is down 12lbs(11%) over the past 3 months; this is significant. Pt already ordered for Ensure supplements. RD will liberalize the heart healthy portion of pt's diet as this is restrictive of protein and add daily MVI. Palliative care consult pending.   Medications reviewed and include: aspirin, D3, lovenox, solu-medrol, morphine, miralax  Labs reviewed: Na 134(L), BUN 46(H)  NUTRITION - FOCUSED PHYSICAL EXAM:    Most Recent Value  Orbital Region Severe depletion  Upper Arm Region Severe depletion  Thoracic and Lumbar Region Severe depletion  Buccal Region Severe depletion  Temple Region Severe depletion  Clavicle Bone Region Severe depletion  Clavicle and Acromion  Bone Region Severe depletion  Scapular Bone Region Severe depletion  Dorsal Hand Severe depletion  Patellar Region Severe depletion  Anterior Thigh Region Severe depletion  Posterior Calf Region Severe depletion  Edema (RD Assessment) Mild  Hair Reviewed  Eyes Reviewed  Mouth Reviewed  Skin Reviewed  Nails Reviewed     Diet Order:   Diet Order            Diet 2 gram sodium Room service appropriate? Yes; Fluid consistency: Thin  Diet effective now                EDUCATION NEEDS:   Education needs have been addressed  Skin:  Skin Assessment: Reviewed RN Assessment (ecchymosis)  Last BM:  9/30  Height:   Ht Readings from Last 1 Encounters:  05/06/2020 5' (1.524 m)    Weight:   Wt Readings from Last 1 Encounters:  05/18/2020 42.2 kg    Ideal Body Weight:  45.45 kg  BMI:  Body mass index is 18.16 kg/m.  Estimated Nutritional Needs:   Kcal:  1200-1400kcal/day  Protein:  60-70g/day  Fluid:  1.2L/day  Koleen Distance MS, RD, LDN Please refer to [facility] for RD and/or RD on-call/weekend/after hours pager

## 2020-05-23 NOTE — Progress Notes (Signed)
Physical Therapy Treatment Patient Details Name: Stephanie Collins MRN: [id number] DOB: 1931-10-28 Today's Date: 05/23/2020    History of Present Illness Stephanie Collins is an 86 y/o female who was admitted with complaints of SOB, diarrhea, discoloration of bilateral feet, increased urinary frequency, confusion, and generalized weakness. PMH includes HTN, COPD on 2L O2, lung cancer, dCHF, CKD stage III, and hyponatrimia.    PT Comments    Pt received lying in bed upon arrival to room with daughter at bedside. Pt agreeable to participate in PT treatment this morning. Pt performed bed mobility with mod I for increased time and required verbal cues for sequencing of tasks. Pt performed sit <> stand from bed and recliner chair with 2 of 3 attempts requiring mod A for boosting hips to standing and eccentric control on descent with one attempt requiring heavy min A. Verbal cues for hand placement and to power through BLE/BUE to come into standing. Pt ambulated a total of 4 feet using RW with min A. After ambulation, pt performed standing marches with min A for balance. Pt required seated rest breaks between all activities secondary to fatigue and remained on 2.5L O2 via nasal cannula. HR noted to jump between 105-120 bpm. O2 noted to drop to 87% and pt able to recover into low 90s with pursed lip breathing with RR in min to upper 20s (24-28). Pt limited overall secondary to decreased functional activity tolerance.      Follow Up Recommendations  SNF;Supervision for mobility/OOB     Equipment Recommendations  None recommended by PT;Other (comment) (pt has equipment)    Recommendations for Other Services       Precautions / Restrictions Precautions Precautions: Fall Restrictions Weight Bearing Restrictions: No    Mobility  Bed Mobility Overal bed mobility: Modified Independent             General bed mobility comments: requires increased time and verbal cues for sequencing  Transfers Overall  transfer level: Needs assistance Equipment used: Rolling walker (2 wheeled) Transfers: Sit to/from Stand Sit to Stand: Mod assist         General transfer comment: Mod A for sit <> stand for boost into standing and eccentric control on descent; subsequent attempts x 2, one requiring mod A and one with heavy min A; verbal cues for hand placement  Ambulation/Gait Ambulation/Gait assistance: Min assist Gait Distance (Feet): 4 Feet Assistive device: Rolling walker (2 wheeled) Gait Pattern/deviations: Step-through pattern;Decreased step length - left;Decreased step length - right;Trunk flexed Gait velocity: decreased   General Gait Details: pt ambulated 4 feet using RW with min A for steadying and safety; pt with decreased step lengths and foot clearance bilaterally   Stairs             Wheelchair Mobility    Modified Rankin (Stroke Patients Only)       Balance Overall balance assessment: Needs assistance Sitting-balance support: Feet supported Sitting balance-Leahy Scale: Fair Sitting balance - Comments: pt leaning forward with elbows on knees secondary to fatigue   Standing balance support: Bilateral upper extremity supported Standing balance-Leahy Scale: Fair Standing balance comment: fair balance in standing with BUE on RW and requiring min A for safety                            Cognition Arousal/Alertness: Awake/alert Behavior During Therapy: WFL for tasks assessed/performed Overall Cognitive Status: Within Functional Limits for tasks assessed  Exercises Other Exercises Other Exercises: pt performed sit <> stands x 3; standing marches x 40 steps (20 each side) with RW and CGA-mid A for steadying    General Comments        Pertinent Vitals/Pain Pain Assessment: No/denies pain    Home Living                      Prior Function            PT Goals (current goals can now be  found in the care plan section) Acute Rehab PT Goals Patient Stated Goal: to go home PT Goal Formulation: With patient Time For Goal Achievement: 05/30/20 Potential to Achieve Goals: Good Progress towards PT goals: Progressing toward goals    Frequency    Min 2X/week      PT Plan Current plan remains appropriate    Co-evaluation              AM-PAC PT "6 Clicks" Mobility   Outcome Measure  Help needed turning from your back to your side while in a flat bed without using bedrails?: A Little Help needed moving from lying on your back to sitting on the side of a flat bed without using bedrails?: A Little Help needed moving to and from a bed to a chair (including a wheelchair)?: A Little Help needed standing up from a chair using your arms (e.g., wheelchair or bedside chair)?: A Little Help needed to walk in hospital room?: A Little Help needed climbing 3-5 steps with a railing? : A Lot 6 Click Score: 17    End of Session Equipment Utilized During Treatment: Other (comment);Oxygen;Gait belt (2.5L) Activity Tolerance: Patient limited by fatigue Patient left: in chair;with call bell/phone within reach;with chair alarm set;with family/visitor present;with nursing/sitter in room Nurse Communication: Mobility status PT Visit Diagnosis: Unsteadiness on feet (R26.81);Muscle weakness (generalized) (M62.81);History of falling (Z91.81);Other abnormalities of gait and mobility (R26.89)     Time: 8251-8984 PT Time Calculation (min) (ACUTE ONLY): 25 min  Charges:                        Stephanie Collins, SPT   Stephanie Collins 05/23/2020, 12:52 PM

## 2020-05-24 DIAGNOSIS — J189 Pneumonia, unspecified organism: Secondary | ICD-10-CM | POA: Diagnosis not present

## 2020-05-24 LAB — BASIC METABOLIC PANEL
Anion gap: 9 (ref 5–15)
BUN: 48 mg/dL — ABNORMAL HIGH (ref 8–23)
CO2: 29 mmol/L (ref 22–32)
Calcium: 8.6 mg/dL — ABNORMAL LOW (ref 8.9–10.3)
Chloride: 94 mmol/L — ABNORMAL LOW (ref 98–111)
Creatinine, Ser: 0.87 mg/dL (ref 0.44–1.00)
GFR calc Af Amer: >60 mL/min (ref >60)
GFR calc non Af Amer: >60 mL/min (ref >60)
Glucose, Bld: 131 mg/dL — ABNORMAL HIGH (ref 70–99)
Potassium: 4.8 mmol/L (ref 3.5–5.1)
Sodium: 132 mmol/L — ABNORMAL LOW (ref 135–145)

## 2020-05-24 LAB — CBC
HCT: 31.9 % — ABNORMAL LOW (ref 36.0–46.0)
Hemoglobin: 10.8 g/dL — ABNORMAL LOW (ref 12.0–15.0)
MCH: 29.9 pg (ref 26.0–34.0)
MCHC: 33.9 g/dL (ref 30.0–36.0)
MCV: 88.4 fL (ref 80.0–100.0)
Platelets: 322 10*3/uL (ref 150–400)
RBC: 3.61 MIL/uL — ABNORMAL LOW (ref 3.87–5.11)
RDW: 17.4 % — ABNORMAL HIGH (ref 11.5–15.5)
WBC: 17 10*3/uL — ABNORMAL HIGH (ref 4.0–10.5)
nRBC: 0 % (ref 0.0–0.2)

## 2020-05-24 LAB — PROCALCITONIN: Procalcitonin: <0.1 ng/mL

## 2020-05-24 LAB — MAGNESIUM: Magnesium: 2.3 mg/dL (ref 1.7–2.4)

## 2020-05-24 MED ORDER — MORPHINE SULFATE (PF) 2 MG/ML IV SOLN
1.0000 mg | INTRAVENOUS | Status: DC | PRN
  Administered 2020-05-24 – 2020-05-25 (×2): 1 mg via INTRAVENOUS
  Filled 2020-05-24 (×2): qty 1

## 2020-05-24 MED ORDER — LORAZEPAM 2 MG/ML IJ SOLN
1.0000 mg | INTRAMUSCULAR | Status: DC | PRN
  Administered 2020-05-24 – 2020-05-26 (×5): 1 mg via INTRAVENOUS
  Filled 2020-05-24 (×5): qty 1

## 2020-05-24 MED ORDER — LORAZEPAM 2 MG/ML IJ SOLN
1.0000 mg | Freq: Once | INTRAMUSCULAR | Status: AC
  Administered 2020-05-24: 1 mg via INTRAVENOUS
  Filled 2020-05-24: qty 1

## 2020-05-24 MED ORDER — METHYLPREDNISOLONE SODIUM SUCC 40 MG IJ SOLR: 40.0000 mg | Freq: Every day | INTRAMUSCULAR | Status: DC

## 2020-05-24 MED ORDER — MORPHINE SULFATE (PF) 2 MG/ML IV SOLN
2.0000 mg | Freq: Once | INTRAVENOUS | Status: AC
  Administered 2020-05-24: 2 mg via INTRAVENOUS
  Filled 2020-05-24: qty 1

## 2020-05-24 MED ORDER — IPRATROPIUM-ALBUTEROL 0.5-2.5 (3) MG/3ML IN SOLN
3.0000 mL | Freq: Three times a day (TID) | RESPIRATORY_TRACT | Status: DC
  Administered 2020-05-24: 3 mL via RESPIRATORY_TRACT
  Filled 2020-05-24: qty 3

## 2020-05-24 NOTE — Progress Notes (Signed)
     Referral received for Stephanie Collins :goals of care discussion. Chart reviewed and updates received from Dr. Billie Ruddy.   I was able to speak with patient's daughter, Stephanie Collins via phone. I introduced myself and Palliative's role in Stephanie Collins's care.  [place] meeting scheduled for 05/25/20 @ 11am. Family is aware we will meet at patient's bedside.   Thank you for your referral and allowing PMT to assist in Stephanie Collins care.   Alda Lea, AGPCNP-BC Palliative Medicine Team  Phone: [phone] Amion: N. [place]   NO CHARGE

## 2020-05-24 NOTE — Progress Notes (Signed)
  Approximately around 1:15 pm,  Patient c/o " I can't swallow" when given multivitamin oral pill in apple sauce.She   then became speechless and her O2 dropped  to 77 %. Called Rapid team. By the time rapid team came , her throat was cleared and pt was able to perform weak cough. MD came at bedside as well.

## 2020-05-24 NOTE — Progress Notes (Signed)
Rapid Response Event Note   Reason for Call : hypoxia   Initial Focused Assessment: Arrived to room to find patient sitting up in bed, 2L [place] in place. Pt had been given multivitamin and it got "hung". When rapid team arrived to room, it had gone down and pt was able to swallow and breath without difficulty.       Interventions: None by this RN   Plan of Care: Patient to remain on floor. MD stated that she would D/C multivitamin    Event Summary:   MD Notified: Billie Ruddy Call Time: 0109 Arrival Time: 3235 End Time: 5732  Mila Merry, RN

## 2020-05-24 NOTE — Progress Notes (Signed)
Speech Language Pathology Treatment: Dysphagia  Patient Details Name: Stephanie Collins MRN: [id number] DOB: 02/12/1932 Today's Date: 05/24/2020 Time: 1430-1510 SLP Time Calculation (min) (ACUTE ONLY): 40 min  Assessment / Plan / Recommendation Clinical Impression  Pt was seen for f/u post evaluation yesterday s/p a Rapid Response being called at Noon. A multivitamin was given by NSG and apparent got "hung", per chart note. When Rapid Response Team arrived to room, the pill had "gone down and pt was able to swallow and breath without difficulty", per note. Upon entering room, pt stated a "dry" mouth causing difficulty to "swallow and breath". She was sitting upright so SLP sat and gave Single ice chips fto ease mouth dryness. No overt s/s of aspiration or decline in status was noted w/ trials of ice chips taken slowly, one at a time. Pt reported dryness relieved and she felt her swallowing was "fine now". Pt's breathing appeared as at baseline -- min increased in effort but no overt WOB; O2 sats 92%.  Pt appears to present w/ adequate function of oropharyngeal phase control and swallowing w/ No overt oropharyngeal phase dysphagia appreciated; No neuromuscular swallowing deficits appreciated. Pt is at reduced risk for aspiration from an oropharyngeal phase standpoint when following general aspiration precautions. Pt has a baseline of a "Large" Hiatal Hernia per Chest CT -- "Esophageal hiatal hernia, large. Much of the stomach in the chest, possibly a paraesophageal hernia". ANY Dysmotility or Retrograde flow of Reflux material can increase risk for aspiration of the Reflux material during backflow thus impact Pulmonary status and increase risk for pneumonia. Pt described ongoing issues w/ discomfort in the mid-upper chest and Inability to take full meals -- "I have not been eating" was pt's statement to SLP. No recent regurgitation but often the discomfort of delayed, dry cough ~1 hour AFTER meals.   To be  conservative re: the Esophageal phase of swallowing(impacting the pharyngeal phase possibly), recommend downgrade to a more Pureed foods diet moistened well w/ condiments, gravies and thin liquids via Cup(less straw use d/t air swallowing); less Carbonated drinks d/t increased air; general aspiration precautions. Rest Breaks during meals/oral intake to allow for Esophageal clearing and calming of breathing if SOB. STRICT REFLUX Precautions strongly recommended to lessen chance for Regurgitation. Pills CRUSHED in Puree for ease of swallow and Esophageal clearing. Recommend pt f/u w/ GI for further Esophageal phase management and education re: her chronic Esophageal issues. A Palliative Care consult would be recommended d/t the chronic nature of her medical issues impacting oral intake.  NSG consulted and updated on pt's status w/ the recommendations offered, and NSG agreed w/ downgrade of diet to be conservative post incident w/ pill earlier today. NSG will reconsult ST services if any new needs while admitted. Much Time was spent w/ pt(family last assessment in 03/2020) re: education on Esophageal dysmotility d/t the Hernia and the impact on pharyngeal swallowing, oral intake overall; discussion of foods/meals; precautions posted in room. Pt appears at her Baseline.     HPI HPI: Pt is a 86 y.o. female with medical history significant of hypertension, COPD on 2 L oxygen, lung cancer, dCHF, CKD stage III, hyponatremia, who presents with shortness of breath, diarrhea, discoloration of bilateral feet, increased urinary frequency, confusion, generalized weakness.  Per patient's daughter, patient normally is alert, oriented x3.  In the past 2 days, patient has been confused.  She is not oriented to time, but is still oriented to place and person.  She has shortness of breath   which has been progressively worsening.  Denies chest pain, fever or chills.  She has lost 6 pounds recently.  Patient has had at least 8 times  of watery diarrhea since yesterday.  Pt has dx'd Acute on chronic COPD exacerbation, Chronic hypoxic respiratory failure on 4L O2 baseline, Severe Dyspnea.  Pt has a Baseline dx of Hiatal Hernia per pt/Dtr report which causes Esophageal Dysmotility w/ po intake for pt.  CT of Chest on 03/22/2020: "Esophageal hiatal hernia, large. Much of the stomach in the chest, possibly a paraesophageal hernia".      SLP Plan  All goals met (pt is at her Baseline; needs Palliative f/u)       Recommendations  Diet recommendations: Dysphagia 1 (puree);Thin liquid (downgrade) Liquids provided via: Cup;No straw (recommended) Medication Administration: Crushed with puree (for ease of Pills clearing the Esophagus) Supervision: Patient able to self feed;Staff to assist with self feeding;Full supervision/cueing for compensatory strategies Compensations: Minimize environmental distractions;Slow rate;Small sips/bites;Lingual sweep for clearance of pocketing;Multiple dry swallows after each bite/sip;Follow solids with liquid Postural Changes and/or Swallow Maneuvers: Seated upright 90 degrees;Upright 30-60 min after meal (STRICT REFLUX PRECS.)                General recommendations:  (Palliative care f/u for GOC/education; GI consult if needed) Oral Care Recommendations: Oral care BID;Oral care before and after PO;Staff/trained caregiver to provide oral care Follow up Recommendations: None (f/u w/ GI; palliative care. Pt appears at her baseline) SLP Visit Diagnosis: Dysphagia, pharyngoesophageal phase (R13.14);Dysphagia, unspecified (R13.10) (Esophageal dysmotility) Plan: All goals met (pt is at her Baseline; needs Palliative f/u)       GO                 Katherine Watson, MS, CCC-SLP Speech Language Pathologist Rehab Services [phone] Watson,Katherine 05/24/2020, 3:55 PM    

## 2020-05-24 NOTE — Progress Notes (Signed)
PROGRESS NOTE    Stephanie Collins  UKG:[id number] DOB: 1932/05/12 DOA: 05/03/2020 PCP: Baxter Hire, MD    Assessment & Plan:   Principal Problem:   HCAP (healthcare-associated pneumonia) Active Problems:   Chronic obstructive pulmonary disease ([place])   Hyponatremia   Sepsis ([place])   Chronic diastolic CHF (congestive heart failure) ([place])   Acute renal failure superimposed on stage 3a chronic kidney disease ([facility])   UTI (urinary tract infection)   Elevated troponin   New onset atrial fibrillation ([place])   Acute metabolic encephalopathy   HTN (hypertension)  Comfort care status --despite max medical therapy, pt is still having significant dyspnea even on scheduled morphine.  Pt also couldn't tolerate oral intake, and started having coughing spells even just with a few sips of Carnation boost.  Rapid call today and multiple alarms from the nursing today.   --Discussed with daughter and pt at the bedside around evening time.  Decision made to initiate comfort care on night of 10/5.  Orders in. PLAN: --IV morphine for air hunger --IV ativan for anxiety and sleep --Palliative provider will see pt tomorrow  --refer to hospice facility tomorrow  Sepsis: secondary to UTI & CAP.  Met criteria w/ leukocytosis, tachycardia, tachypnea.  --completed 5 days of vanc/cefepime  CAP: --on home 4L O2.  Reported having difficulty bringing up sputum --Strept is neg --completed 5 days of vanc/cefepime  UTI: urine cx is growing e.coli, proteus, both sensitive to cefepime. --completed 5 days of cefepime.  Acute on chronic COPD exacerbation Chronic hypoxic respiratory failure on 4L O2 baseline --In light of persistent dyspnea despite finishing PNA treatment, and having increased brown thick sputum, pt is having acute exacerbation of COPD. PLAN: --d/c breathing tx and chest PT due to comfort care status.  # Severe dyspnea --started morphine 5 mg q6h for severe dyspnea and air hunger on 10/1, with  improvement.  Dyspnea and work-of-breathing returned when morphine doses skipped. PLAN: --IV morphine PRN for air hunger  # Hyperkalemia --unclear etiology.   --s/p Kayexalate x2 PLAN: --d/c labs due to comfort care status.  Hx of HTN, not currently active Hypotension --BP has been 100's-110's --cont to hold home coreg and ACEi and lasix --caution IVF boluses due to [facility]  Hyponatremia, improved  Chronic diastolic CHF:  echo 7/62/8315 showed EF of 55 to 60% with grade II diastolic dysfunction.   --cont to hold home lasix 20 mg due to intermittent low BP --caution IVF boluses  AKI on CKDIIIa: resolved  A. fib: new onset.  D/c IV heparin drip. Not on anticoagulation secondary to fall risk.  --d/c tele due to comfort care status.  elevated troponins, likely secondary to demand ischemia & a. fib.   Acute metabolic encephalopathy, resolved likely multifactorial, including CAP, UTI & hypoxia. Re-orient prn   Discoloration of bilateral feet:  no ischemia as per vascular surg  Severe malnutrition in context of chronic illness --will not push oral intake due to significant coughing spells.  Comfort care status now.  Large esophageal hiatal hernia --at increased risk for aspiration of reflux material  Leukoplakia  --grey patches found under the tongue and on oral mucosa, painless. --No tx available.    DVT prophylaxis: Lovenox SQ Code Status: DNR  Comfort care initiated night of 05/24/20 Family Communication: daughter updated at the bedside today Status is: inpatient Dispo:   The patient is from: home Anticipated d/c is to: hospice facility Anticipated d/c date is: whenever bed available.  Patient currently is ready  to d/c to hospice facility.   Consultants:   Cardio  vascular   Procedures:    Antimicrobials: vanco, cefepime    Subjective: Pt received IV ativan last night which made her sleep.  Rapid call around mid-day due to pt choking on pill and  couldn't catch her breath.  Significant dyspnea not improved with breathing tx.  Gave IV morphine which made pt comfortable and back to sleep, however, later in the afternoon woke up and again felt very short of breath.  Talked with daughter at the bedside in the evening, and discussed with pt, and decision was made to initiate comfort care.   Objective: Vitals:   05/24/20 1310 05/24/20 1311 05/24/20 1325 05/24/20 1544  BP: 111/85   107/73  Pulse: (!) 111 (!) 104  94  Resp:    (!) 28  Temp:    (!) 97.4 F (36.3 C)  TempSrc:    Oral  SpO2: (!) 86% 92% 96% 94%  Weight:      Height:        Intake/Output Summary (Last 24 hours) at 05/24/2020 1707 Last data filed at 05/24/2020 1300 Gross per 24 hour  Intake 0 ml  Output 500 ml  Net -500 ml   Filed Weights   05/07/2020 1259 05/05/2020 1523  Weight: 43.5 kg 42.2 kg    Examination:  Constitutional: In mild distress, more lethargic today HEENT: conjunctivae and lids normal, EOMI CV: RRR no M,R,G. Distal pulses +2.  No cyanosis.   RESP: Diffuse loud rhonchi and coarse lung sounds, on 2L GI: +BS, NTND Extremities: No effusions, edema in BLE SKIN: warm, dry and intact Neuro: II - XII grossly intact.  Sensation intact Psych: depressed and anxious mood and affect.     Data Reviewed: I have personally reviewed following labs and imaging studies  CBC: Recent Labs  Lab 05/20/20 0859 05/21/20 0550 05/22/20 0504 05/23/20 0433 05/24/20 0534  WBC 7.9 8.7 8.3 9.1 17.0*  HGB 11.4* 10.7* 10.4* 11.0* 10.8*  HCT 33.2* 32.5* 30.2* 30.3* 31.9*  MCV 86.7 83.3 87.3 92.4 88.4  PLT 305 330 331 330 400   Basic Metabolic Panel: Recent Labs  Lab 05/20/20 0426 05/21/20 0550 05/22/20 0504 05/23/20 0433 05/24/20 0534  NA 130* 129* 133* 134* 132*  K 4.4 5.6* 5.4* 4.9 4.8  CL 98 96* 96* 96* 94*  CO2 24 26 32 29 29  GLUCOSE 162* 156* 148* 138* 131*  BUN 49* 46* 44* 46* 48*  CREATININE 0.89 0.97 0.86 0.78 0.87  CALCIUM 8.9 9.2 8.7* 8.7*  8.6*  MG 2.1 2.1 2.2 2.3 2.3   GFR: Estimated Creatinine Clearance: 30.9 mL/min (by C-G formula based on SCr of 0.87 mg/dL). Liver Function Tests: No results for input(s): AST, ALT, ALKPHOS, BILITOT, PROT, ALBUMIN in the last 168 hours. No results for input(s): LIPASE, AMYLASE in the last 168 hours. No results for input(s): AMMONIA in the last 168 hours. Coagulation Profile: No results for input(s): INR, PROTIME in the last 168 hours. Cardiac Enzymes: No results for input(s): CKTOTAL, CKMB, CKMBINDEX, TROPONINI in the last 168 hours. BNP (last 3 results) No results for input(s): PROBNP in the last 8760 hours. HbA1C: No results for input(s): HGBA1C in the last 72 hours. CBG: No results for input(s): GLUCAP in the last 168 hours. Lipid Profile: No results for input(s): CHOL, HDL, LDLCALC, TRIG, CHOLHDL, LDLDIRECT in the last 72 hours. Thyroid Function Tests: No results for input(s): TSH, T4TOTAL, FREET4, T3FREE, THYROIDAB in the last 72  hours. Anemia Panel: No results for input(s): VITAMINB12, FOLATE, FERRITIN, TIBC, IRON, RETICCTPCT in the last 72 hours. Sepsis Labs: Recent Labs  Lab 05/24/20 0534  PROCALCITON <0.10    Recent Results (from the past 240 hour(s))  MRSA PCR Screening     Status: Abnormal   Collection Time: 05/12/2020  8:14 PM   Specimen: Nasopharyngeal  Result Value Ref Range Status   MRSA by PCR POSITIVE (A) NEGATIVE Final    Comment:        The GeneXpert MRSA Assay (FDA approved for NASAL specimens only), is one component of a comprehensive MRSA colonization surveillance program. It is not intended to diagnose MRSA infection nor to guide or monitor treatment for MRSA infections. CRITICAL RESULT CALLED TO, READ BACK BY AND VERIFIED WITH: MEGAN GRAF $[email or web address]$  04/28/2020 TTG Performed at [facility], [address], [place], [place] [address]          Radiology Studies: No results found.      Scheduled Meds:  acetylcysteine  3 mL  Nebulization BID   aspirin EC  81 mg Oral Daily   atorvastatin  40 mg Oral Daily   cholecalciferol  2,000 Units Oral Daily   enoxaparin (LOVENOX) injection  30 mg Subcutaneous Q24H   feeding supplement (ENSURE ENLIVE)  237 mL Oral BID BM   guaiFENesin  600 mg Oral BID   ipratropium-albuterol  3 mL Nebulization TID   mouth rinse  15 mL Mouth Rinse BID   [START ON 05/25/2020] methylPREDNISolone (SOLU-MEDROL) injection  40 mg Intravenous Daily   morphine  4 mg Oral Q6H   multivitamin with minerals  1 tablet Oral Daily   polyethylene glycol  34 g Oral BID   Continuous Infusions:    LOS: 10 days    Enzo Bi, MD [facility] Pager [phone]  If 7PM-7AM, please contact night-coverage [email or web address] 05/24/2020, 5:07 PM

## 2020-05-24 NOTE — Progress Notes (Signed)
Patient keep calling c/o " I can't breath" O2 Sats at 4L is 90% . I have been in patient room to attend patient for the same issue at least 4-5 times during the shift.. Md aware and new order received.

## 2020-05-24 NOTE — Progress Notes (Signed)
Mobility Specialist - Progress Note   05/24/20 1558  Mobility  Activity Contraindicated/medical hold  Mobility performed by Mobility specialist    Per discussion with nurse, session was contraindicated d/t pt being SOB. Will continue to monitor and follow up at another date/time.    Kathee Delton Mobility Specialist 05/24/20, 3:59 PM

## 2020-05-24 NOTE — Progress Notes (Signed)
OT Cancellation Note  Patient Details Name: Stephanie Collins MRN: [id number] DOB: 1932/03/19   Cancelled Treatment:    Reason Eval/Treat Not Completed: Patient declined, no reason specified. Per chart, pt noted with rapid response earlier (difficulty swallowing multivitamin but resolved). Upon attempt, pt looks unwell, reports difficulty breathing. O2 91-92% on 2L O2, HR up to 126 at rest. Nurse tech notified promptly when entering pt's room and noted she will speak with RN. Will hold OT tx this date and re-attempt at later date/time as pt is medically appropriate.  Jeni Salles, MPH, MS, OTR/L ascom [phone] 05/24/20, 1:48 PM

## 2020-05-25 DIAGNOSIS — N3000 Acute cystitis without hematuria: Secondary | ICD-10-CM | POA: Diagnosis not present

## 2020-05-25 DIAGNOSIS — G9341 Metabolic encephalopathy: Secondary | ICD-10-CM | POA: Diagnosis not present

## 2020-05-25 DIAGNOSIS — A419 Sepsis, unspecified organism: Secondary | ICD-10-CM | POA: Diagnosis not present

## 2020-05-25 DIAGNOSIS — I5032 Chronic diastolic (congestive) heart failure: Secondary | ICD-10-CM | POA: Diagnosis not present

## 2020-05-25 DIAGNOSIS — Z7189 Other specified counseling: Secondary | ICD-10-CM

## 2020-05-25 DIAGNOSIS — N179 Acute kidney failure, unspecified: Secondary | ICD-10-CM | POA: Diagnosis not present

## 2020-05-25 DIAGNOSIS — Z66 Do not resuscitate: Secondary | ICD-10-CM

## 2020-05-25 DIAGNOSIS — Z515 Encounter for palliative care: Secondary | ICD-10-CM

## 2020-05-25 DIAGNOSIS — J189 Pneumonia, unspecified organism: Secondary | ICD-10-CM | POA: Diagnosis not present

## 2020-05-25 DIAGNOSIS — R0600 Dyspnea, unspecified: Secondary | ICD-10-CM

## 2020-05-25 MED ORDER — MORPHINE SULFATE (PF) 2 MG/ML IV SOLN
1.0000 mg | INTRAVENOUS | Status: DC | PRN
  Administered 2020-05-25: 2 mg via INTRAVENOUS
  Administered 2020-05-25: 1 mg via INTRAVENOUS
  Administered 2020-05-26: 2 mg via INTRAVENOUS
  Administered 2020-05-26: 1 mg via INTRAVENOUS
  Administered 2020-05-26: 2 mg via INTRAVENOUS
  Filled 2020-05-25 (×5): qty 1

## 2020-05-25 MED ORDER — ACETAMINOPHEN 650 MG RE SUPP: 650.0000 mg | Freq: Four times a day (QID) | RECTAL | Status: DC | PRN

## 2020-05-25 MED ORDER — MORPHINE SULFATE (PF) 2 MG/ML IV SOLN
2.0000 mg | Freq: Four times a day (QID) | INTRAVENOUS | Status: DC
  Administered 2020-05-25: 2 mg via INTRAVENOUS
  Filled 2020-05-25: qty 1

## 2020-05-25 MED ORDER — BIOTENE DRY MOUTH MT LIQD: 15.0000 mL | OROMUCOSAL | Status: DC | PRN

## 2020-05-25 MED ORDER — POLYVINYL ALCOHOL 1.4 % OP SOLN
1.0000 [drp] | Freq: Four times a day (QID) | OPHTHALMIC | Status: DC | PRN
  Filled 2020-05-25: qty 15

## 2020-05-25 MED ORDER — DIPHENHYDRAMINE HCL 50 MG/ML IJ SOLN: 12.5000 mg | Freq: Every evening | INTRAMUSCULAR | Status: DC | PRN

## 2020-05-25 MED ORDER — GLYCOPYRROLATE 0.2 MG/ML IJ SOLN
0.3000 mg | INTRAMUSCULAR | Status: DC
  Administered 2020-05-25 – 2020-05-26 (×6): 0.3 mg via INTRAVENOUS
  Filled 2020-05-25 (×7): qty 1.5

## 2020-05-25 NOTE — Progress Notes (Addendum)
Manufacturing engineer hospital Liaison note:  New referral for [facility] hospice home received from Palliative NP [employer]. TOC [place] aware. Patient information given to referral. Hospice home eligibility has been confirmed. Writer met in the room with patient's daughter Jenny Reichmann, granddaughter and niece to initiate education regarding hospice services, philosophy, team approach to care and current visitation policy with understanding voiced. Questions answered. Emotional support given. Contact information given to [place].  [facility] does not have bed availability today. Family and hospital care team aware. Liaison will continue to follow and update daily regarding bed availability.  Thank you for this opportunity to be involved in the care of this patient and her family. Flo Shanks BSN, RN, [place] [phone]

## 2020-05-25 NOTE — Progress Notes (Addendum)
   05/25/20 1700  Clinical Encounter Type  Visited With Patient and family together  Visit Type Initial  Referral From Nurse  Consult/Referral To Chaplain  Chaplain responded to [facility] End of Life. When chaplain arrived she found daughter and granddaughter at PT's bedside. They shared some lovely and some funny stories about Ms. Glennon Hamilton. 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 They said the way she is now is not a good quality of life and she would not want to be like this. Chaplain told them she thinks choosing comfort care was the right decision to make. Before leaving chaplain asked if she could pray with them and they said yes. Chaplain prayer and left shortly thereafter. Chaplain told them her availability and left giving them time alone with their mother and grandmother.

## 2020-05-25 NOTE — TOC Progression Note (Signed)
Transition of Care [facility]) - Progression Note    Patient Details  Name: Stephanie Collins MRN: [id number] Date of Birth: [birth year]  Transition of Care [facility]) CM/SW [place], LCSW Phone Number: 05/25/2020, 12:52 PM  Clinical Narrative:  CSW acknowledges consult for [facility] Flo Shanks, RN with [facility] is aware of referral. No beds today. CSW met with patient's daughter to make her aware.   Expected Discharge Plan: [place] Barriers to Discharge: Continued Medical Work up  Expected Discharge Plan and Services Expected Discharge Plan: [place] arrangements for the past 2 months: Single Family Home                                       Social Determinants of Health (SDOH) Interventions    Readmission Risk Interventions Readmission Risk Prevention Plan 05/17/2020  Transportation Screening Complete  Medication Review Press photographer) Complete  Some recent data might be hidden

## 2020-05-25 NOTE — Consult Note (Signed)
Consultation Note Date: 05/25/2020   Patient Name: Stephanie Collins  DOB: 1932/04/12  MRN: [id number]  Age / Sex: 86 y.o., female   PCP: Baxter Hire, MD Referring Physician: Ezekiel Slocumb, DO   REASON FOR CONSULTATION:Establishing goals of care  Palliative Care consult requested for goals of care discussion in this 86 y.o. female with multiple medical problems including hypertension, COPD (2L home oxygen), lung cancer s/p right lower lobectomy (2010), dCHF, CKD stage III, and hyponatremia.  Patient presented to the ED with complaints of shortness of breath, discoloration of bilateral feet, increased urinary frequency, confusion, and generalized weakness.  Daughter also reports diarrhea.  During work-up urinalysis positive for UTI, Covid PCR negative, WBC 22.7, chest x-ray showed bilateral basilar opacity.  Since admission patient continues to show minimal improvement.  Completed 5-day course of antibiotics.  Clinical Assessment and Goals of Care: I have reviewed medical records including lab results, imaging, Epic notes, and MAR, received report from the bedside RN, and assessed the patient. I met at the bedside with her daughter/POA Junious Silk to discuss diagnosis prognosis, [place], EOL wishes, disposition and options.  Ms. Salomone is somnolent with occasional awakeness.  I introduced Palliative Medicine as specialized medical care for people living with serious illness. It focuses on providing relief from the symptoms and stress of a serious illness. The goal is to improve quality of life for both the patient and the family.  Daughter verbalizes understanding and appreciation.  We discussed a brief life review of the patient, along with her functional and nutritional status.  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  This is the only child.  Patient has 2 siblings (1 estranged brother, and a sister who resides at [facility] facility suffering from  Lewy body).  Patient is of Christian faith.   Caren Griffins reports prior to admission patient was living fairly independent.  She was able to perform all ADLs independently with some noticeable decline over the past several weeks.  Daughter reports patient self began significantly declining in August including multiple admissions.  Prior to August patient was driving and running errands independently.  Daughter shares prior to admission family had to stay with patient due to noticeable confusion and generalized weakness.  We discussed Her current illness and what it means in the larger context of Her on-going co-morbidities.  Natural disease trajectory and expectations at EOL were discussed.   Daughter verbalizes understanding of her mother's current illness and comorbidities.  She expresses although it is hard to make decisions she knows that her mother would not want to live in her current conditions.  She shares that quality of life was most important to patient versus quantity of life.  Caren Griffins spends time sharing her noticeable changes in her mother's health over the past several months.  She states at this point her focus would like to be on keeping her mother comfortable and spending what time she has left with her.  I attempted to elicit values and goals of care important to the patient.    Patient has transition to comfort care.  Detailed education regarding what full comfort care will look like for patient and expectations at end of life.  Caren Griffins verbalized understanding and appreciation.  Daughter confirms wishes for full comfort and DNR/DNI.  Hospice services outpatient were explained and offered.  Caren Griffins verbalized her understanding and awareness of hospice's goals and philosophy of care.  Detailed discussion and education  provided regarding hospice in the home versus at a hospice facility.  Family would like for patient to be considered for residential hospice facility with a preference of  [place] location.  Education provided on the referral process and once patient accepted the transfer process.  Cynthia verbalizes understanding and appreciation with awareness patient will remain hospitalized and continue to receive comfort care until approved and bed is available.  She verbalizes understanding that patient may pass away prior to ability to transfer to hospice facility.  Questions and concerns were addressed. The family was encouraged to call with questions or concerns.  PMT will continue to support holistically.   SOCIAL HISTORY:     reports that she has quit smoking. She has quit using smokeless tobacco. She reports that she does not drink alcohol and does not use drugs.  CODE STATUS: DNR  ADVANCE DIRECTIVES: Primary Decision Maker: Junious Silk (daughter/HC POA)   SYMPTOM MANAGEMENT: See below  Palliative Prophylaxis:   Aspiration, Eye Care, Frequent Pain Assessment, Oral Care and Turn Reposition  PSYCHO-SOCIAL/SPIRITUAL:  Support System: Family  Desire for further Chaplaincy support: Yes  Additional Recommendations (Limitations, Scope, Preferences):  Full Comfort Care  Education on hospice    PAST MEDICAL HISTORY: Past Medical History:  Diagnosis Date  . Cancer ([place]) 2010   lung  . Cough 01/19/2016  . Hypertension   . Lung cancer ([place]) 01/19/2016    ALLERGIES:  is allergic to tramadol.   MEDICATIONS:  Current Facility-Administered Medications  Medication Dose Route Frequency Provider Last Rate Last Admin  . acetaminophen (TYLENOL) tablet 650 mg  650 mg Oral Q6H PRN Ivor Costa, MD   650 mg at 05/24/20 0101  . albuterol (PROVENTIL) (2.5 MG/3ML) 0.083% nebulizer solution 2.5 mg  2.5 mg Nebulization Q4H PRN Ivor Costa, MD      . calcium carbonate (TUMS - dosed in mg elemental calcium) chewable tablet 300 mg of elemental calcium  1.5 tablet Oral PRN Ivor Costa, MD      . dextromethorphan-guaiFENesin [facility] DM) 30-600 MG per 12 hr tablet 1 tablet   1 tablet Oral BID PRN Ivor Costa, MD   1 tablet at 05/18/20 1625  . diphenhydrAMINE (BENADRYL) capsule 25 mg  25 mg Oral QHS PRN Ivor Costa, MD      . LORazepam (ATIVAN) injection 1 mg  1 mg Intravenous Q4H PRN Enzo Bi, MD   1 mg at 05/24/20 2123  . MEDLINE mouth rinse  15 mL Mouth Rinse BID Wyvonnia Dusky, MD   15 mL at 05/24/20 2121  . metoprolol tartrate (LOPRESSOR) injection 5 mg  5 mg Intravenous Q6H PRN Wyvonnia Dusky, MD      . morphine 2 MG/ML injection 1 mg  1 mg Intravenous Q2H PRN Enzo Bi, MD   1 mg at 05/25/20 0315  . morphine 2 MG/ML injection 2 mg  2 mg Intravenous Q6H Nicole Kindred A, DO   2 mg at 05/25/20 0935  . ondansetron (ZOFRAN) injection 4 mg  4 mg Intravenous Q8H PRN Ivor Costa, MD        VITAL SIGNS: BP 94/67 (BP Location: Right Arm)   Pulse 91   Temp 97.7 F (36.5 C)   Resp (!) 24   Ht 5' (1.524 m)   Wt 42.2 kg   SpO2 95%   BMI 18.16 kg/m  Filed Weights   05/13/2020 1259 05/11/2020 1523  Weight: 43.5 kg 42.2 kg    Estimated body mass index is 18.16 kg/m as  calculated from the following:   Height as of this encounter: 5' (1.524 m).   Weight as of this encounter: 42.2 kg.  LABS: CBC:    Component Value Date/Time   WBC 17.0 (H) 05/24/2020 0534   HGB 10.8 (L) 05/24/2020 0534   HGB 14.3 07/27/2014 1028   HCT 31.9 (L) 05/24/2020 0534   HCT 41.2 07/27/2014 1028   PLT 322 05/24/2020 0534   PLT 257 07/27/2014 1028   Comprehensive Metabolic Panel:    Component Value Date/Time   NA 132 (L) 05/24/2020 0534   NA 138 07/27/2014 1028   K 4.8 05/24/2020 0534   K 3.8 07/27/2014 1028   BUN 48 (H) 05/24/2020 0534   BUN 29 (H) 07/27/2014 1028   CREATININE 0.87 05/24/2020 0534   CREATININE 1.43 (H) 07/27/2014 1028   ALBUMIN 2.7 (L) 05/17/2020 1232   ALBUMIN 3.8 07/27/2014 1028     Review of Systems  Unable to perform ROS: Acuity of condition   Physical Exam General: Somnolent, frail chronically-ill appearing Cardiovascular:  Irregular Pulmonary:  diminished bilaterally, wheezing Abdomen: soft, nontender, + bowel sounds Neurological: Unable to assess, patient will open eyes intermittently, will not follow commands.  Prognosis: < 2 weeks in the setting of COPD, hyponatremia, sepsis, diastolic CHF, chronic kidney disease stage III, poor p.o. intake, weight loss greater than 15%, protein calorie malnutrition, large esophageal hiatal hernia, new onset atrial fib, hypertension, COPD, UTI, 2 L, nasal cannula, bilateral feet discoloration, elevated troponin.  Discharge Planning:  Hospice facility  Recommendations: . DNR/DNI-as confirmed by daughter, Caren Griffins. Completed form on chart. . All care to focus on for comfort . Daughter requesting hosp[facility] home with [facility] location as preference (TOC notified with order placed) Morphine PRN for pain/air hunger/comfort Robinul for excessive secretions Ativan PRN for agitation/anxiety Zofran PRN for nausea Liquifilm tears PRN for dry eyes May have comfort feeding Comfort cart for family Unrestricted visitations in the setting of EOL (per policy) Oxygen PRN 2L or less for comfort. No escalation.  Marland Kitchen PMT will continue to support and follow. Please call team line with urgent needs.   Palliative Performance Scale: PPS 10-20%              Caren Griffins (daughter) expressed understanding and was in agreement with this plan.   Thank you for allowing the Palliative Medicine Team to assist in the care of this patient.  Time In: 1045 Time Out: 1150 Time Total: 56  Min.   Visit consisted of counseling and education dealing with the complex and emotionally intense issues of symptom management and palliative care in the setting of serious and potentially life-threatening illness.Greater than 50%  of this time was spent counseling and coordinating care related to the above assessment and plan.  Signed by:  Alda Lea, AGPCNP-BC Palliative Medicine Team  Phone:  [phone] Pager: [phone] Amion: Bjorn Pippin

## 2020-05-25 NOTE — Hospital Course (Signed)
From H&P by Dr. Blaine Hamper: "HPI: Stephanie Collins is a 86 y.o. female with medical history significant of hypertension, COPD on 2 L oxygen, lung cancer, dCHF, CKD stage III, hyponatremia, who presents with shortness of breath, diarrhea, discoloration of bilateral feet, increased urinary frequency, confusion, generalized weakness.   Per patient's daughter, patient normally is alert, oriented x3.  In the past 2 days, patient has been confused.  She is not oriented to time, but is still oriented to place and person.  She has shortness of breath which has been progressively worsening.  Denies chest pain, fever or chills.  She has lost 6 pounds recently.  Patient has had at least 8 times of watery diarrhea since yesterday.  No nausea, vomiting or abdominal pain.  Patient has increased urinary frequency, but no dysuria or burning on urination.  Patient moves all extremities.  No facial droop or slurred speech.  Per her daughter, patient had an episode of dark purplish discoloration of bilateral feet.  Left foot is cool. Her foot is mildly painful bilaterally.   ED Course: pt was found to have troponin 125 -->630, WBC 22.7, positive urinalysis (cloudy appearance, large amount of leukocyte, rare bacteria, WBC> 50), negative Covid PCR, lactic acid 1.3, sodium 128, worsening renal function, temperature normal, soft blood pressure, tachycardia with heart rate 96, tachypnea with RR 27, oxygen saturation 90% on room air, which improved to 96% on 2 L oxygen.  Chest x-ray showed bilateral basilar opacity.  Patient is admitted to [facility] bed as inpatient."  Despite maximal therapy for infections, patient continued to clinically decline.  Dr. Billie Ruddy met with patient and daughter on 10/5 and the decision was made to transition to comfort care measures.

## 2020-05-25 NOTE — Progress Notes (Signed)
PROGRESS NOTE    Stephanie Collins   TRV:[id number]  DOB: 06-Dec-1931  PCP: Baxter Hire, MD    DOA: 04/30/2020 LOS: 11   Brief Narrative   From H&P by Dr. Blaine Hamper: "HPI: Stephanie Collins is a 86 y.o. female with medical history significant of hypertension, COPD on 2 L oxygen, lung cancer, dCHF, CKD stage III, hyponatremia, who presents with shortness of breath, diarrhea, discoloration of bilateral feet, increased urinary frequency, confusion, generalized weakness.   Per patient's daughter, patient normally is alert, oriented x3.  In the past 2 days, patient has been confused.  She is not oriented to time, but is still oriented to place and person.  She has shortness of breath which has been progressively worsening.  Denies chest pain, fever or chills.  She has lost 6 pounds recently.  Patient has had at least 8 times of watery diarrhea since yesterday.  No nausea, vomiting or abdominal pain.  Patient has increased urinary frequency, but no dysuria or burning on urination.  Patient moves all extremities.  No facial droop or slurred speech.  Per her daughter, patient had an episode of dark purplish discoloration of bilateral feet.  Left foot is cool. Her foot is mildly painful bilaterally.   ED Course: pt was found to have troponin 125 -->630, WBC 22.7, positive urinalysis (cloudy appearance, large amount of leukocyte, rare bacteria, WBC> 50), negative Covid PCR, lactic acid 1.3, sodium 128, worsening renal function, temperature normal, soft blood pressure, tachycardia with heart rate 96, tachypnea with RR 27, oxygen saturation 90% on room air, which improved to 96% on 2 L oxygen.  Chest x-ray showed bilateral basilar opacity.  Patient is admitted to [place] bed as inpatient."  Despite maximal therapy for infections, patient continued to clinically decline.  Dr. Billie Ruddy met with patient and daughter on 10/5 and the decision was made to transition to comfort care measures.        Assessment & Plan    Principal Problem:   HCAP (healthcare-associated pneumonia) Active Problems:   Chronic obstructive pulmonary disease (HCC)   Hyponatremia   Sepsis ([place])   Chronic diastolic CHF (congestive heart failure) (HCC)   Acute renal failure superimposed on stage 3a chronic kidney disease (HCC)   UTI (urinary tract infection)   Elevated troponin   New onset atrial fibrillation (HCC)   Acute metabolic encephalopathy   HTN (hypertension)   Comfort care status Despite max medical therapy, pt continues with significant dyspnea even on scheduled morphine.   Pt also couldn't tolerate oral intake, and started having coughing spells even just with a few sips of Carn[place] boost.   Rapid call today and multiple alarms from the nursing on 10/5. Dr. Billie Ruddy on 10/5 evening - discussed with daughter and pt at the bedside around evening time.  Decision made to initiate comfort care on night of 10/5.   --IV morphine for air hunger --IV ativan for anxiety and sleep --Palliative consulted --awaiting available bed at residential hospice  Sepsis: secondary to UTI & CAP.  Met criteria w/ leukocytosis, tachycardia, tachypnea.  --completed 5 days of vanc/cefepime  CAP - completed 5 days of vanc/cefepime.   Chronic respiratory failure with hypoxia - on home 4L/min O2  UTI - urine cx is growing e.coli, proteus, both sensitive to cefepime.  Completed 5 days of cefepime.  Acute on chronic COPD exacerbation Chronic hypoxic respiratory failure on 4L O2 baseline --d/c breathing tx and chest PT due to comfort care status.  Severe dyspnea -  scheduled and PRN morphine for air hunger (started on 10/1), with improvement.    Hyperkalemia - unclear etiology.  Given Kayexalate x2.  No further labs due to comfort care status.  Hx of HTN, not currently active Hypotension --BP has been 100's-110's --cont to hold home coreg and ACEi and lasix --now comfort   Hyponatremia, improved  Chronic diastolic CHF -  Echo 7/56/4332 showed EF of 55 to 60% with grade II diastolic dysfunction.   AKI on CKDIIIa: resolved  A. fib: new onset D/c IV heparin drip. Not on anticoagulation secondary to fall risk.  --d/c tele due to comfort care status.  Elevated troponin - likely secondary to demand ischemia & a. fib.   Acute metabolic encephalopathy - resolved.  Was likely multifactorial, including CAP, UTI & hypoxia.   Discoloration of bilateralfeet - no ischemia as per vascular surg  Severe malnutrition in context of chronic illness - Comfort care status now.  PO intake for comfort.  Large esophageal hiatal hernia - puts pt at increased risk for aspiration of reflux material  Leukoplakia - grey patches found under the tongue and on oral mucosa, painless.  No tx available.     DVT prophylaxis: none - comfort care status   Diet:  Diet Orders (From admission, onward)    Start     Ordered   05/24/20 1541  DIET - DYS 1 Room service appropriate? Yes with Assist; Fluid consistency: Thin  Diet effective now       Comments: Extra Gravy on meats, potatoes. NO Straws. Pudding, Yogurt, Cream Soups, ice cream at meals. May have Oatmeal per Speech w/ butter and brown sugar.  Question Answer Comment  Room service appropriate? Yes with Assist   Fluid consistency: Thin      05/24/20 1545            Code Status: DNR    Subjective 05/25/20    Patient seen with daughter and ?granddaughter at bedside today.  Pt eating applesauce and tolerating, but family having to remind her to swallow.  She has a wet sounding cough.  Patient denies pain or discomfort at this time.    Disposition Plan & Communication   Status is: Inpatient  Remains inpatient appropriate because:comfort care status awaiting hospice bed.   Dispo: The patient is from: Home              Anticipated d/c is to: hospice              Anticipated d/c date is: 1 day              Patient currently is not medically stable to  d/c.        Family Communication: daughter at bedside on rounds    Consults, Procedures, Significant Events   Consultants:   Palliative care  Procedures:   none  Antimicrobials:  Anti-infectives (From admission, onward)   Start     Dose/Rate Route Frequency Ordered Stop   05/16/20 1600  vancomycin (VANCOREADY) IVPB 500 mg/100 mL  Status:  Discontinued        500 mg 100 mL/hr over 60 Minutes Intravenous Every 48 hours 05/19/2020 2340 05/15/20 0833   05/16/20 1600  vancomycin (VANCOREADY) IVPB 750 mg/150 mL  Status:  Discontinued        750 mg 150 mL/hr over 60 Minutes Intravenous Every 48 hours 05/15/20 0833 05/19/20 1010   05/15/20 2200  ceFEPIme (MAXIPIME) 2 g in sodium chloride 0.9 % 100 mL IVPB  Status:  Discontinued        2 g 200 mL/hr over 30 Minutes Intravenous Every 24 hours 05/19/2020 2158 05/19/20 1010   04/21/2020 2200  ceFEPIme (MAXIPIME) 1 g in sodium chloride 0.9 % 100 mL IVPB        1 g 200 mL/hr over 30 Minutes Intravenous  Once 04/29/2020 2158 05/08/2020 2332   05/03/2020 1530  vancomycin (VANCOCIN) IVPB 1000 mg/200 mL premix        1,000 mg 200 mL/hr over 60 Minutes Intravenous  Once 05/13/2020 1501 05/15/2020 1735   04/25/2020 1445  ceFEPIme (MAXIPIME) 1 g in sodium chloride 0.9 % 100 mL IVPB        1 g 200 mL/hr over 30 Minutes Intravenous  Once 04/22/2020 1440 05/06/2020 1607   04/29/2020 1400  cefTRIAXone (ROCEPHIN) 2 g in sodium chloride 0.9 % 100 mL IVPB  Status:  Discontinued        2 g 200 mL/hr over 30 Minutes Intravenous Every 24 hours 05/12/2020 1347 04/28/2020 1440   04/20/2020 1400  azithromycin (ZITHROMAX) 500 mg in sodium chloride 0.9 % 250 mL IVPB  Status:  Discontinued        500 mg 250 mL/hr over 60 Minutes Intravenous Every 24 hours 05/09/2020 1347 04/22/2020 1440        Objective   Vitals:   05/24/20 1325 05/24/20 1544 05/24/20 1747 05/24/20 2052  BP:  107/73 106/73 94/67  Pulse:  94 63 91  Resp:  (!) 28 (!) 24   Temp:  (!) 97.4 F (36.3 C) 97.9 F (36.6  C) 97.7 F (36.5 C)  TempSrc:  Oral Oral   SpO2: 96% 94% 96% 95%  Weight:      Height:        Intake/Output Summary (Last 24 hours) at 05/25/2020 1532 Last data filed at 05/25/2020 1026 Gross per 24 hour  Intake 120 ml  Output --  Net 120 ml   Filed Weights   05/15/2020 1259 04/25/2020 1523  Weight: 43.5 kg 42.2 kg    Physical Exam:  General exam: awake, alert, no acute distress Respiratory system: coarse secretion sounds, normal respiratory effort. Cardiovascular system: normal S1/S2, RRR, no pedal edema.   Gastrointestinal system: soft, NT, ND   Labs   Data Reviewed: I have personally reviewed following labs and imaging studies  CBC: Recent Labs  Lab 05/20/20 0859 05/21/20 0550 05/22/20 0504 05/23/20 0433 05/24/20 0534  WBC 7.9 8.7 8.3 9.1 17.0*  HGB 11.4* 10.7* 10.4* 11.0* 10.8*  HCT 33.2* 32.5* 30.2* 30.3* 31.9*  MCV 86.7 83.3 87.3 92.4 88.4  PLT 305 330 331 330 122   Basic Metabolic Panel: Recent Labs  Lab 05/20/20 0426 05/21/20 0550 05/22/20 0504 05/23/20 0433 05/24/20 0534  NA 130* 129* 133* 134* 132*  K 4.4 5.6* 5.4* 4.9 4.8  CL 98 96* 96* 96* 94*  CO2 24 26 32 29 29  GLUCOSE 162* 156* 148* 138* 131*  BUN 49* 46* 44* 46* 48*  CREATININE 0.89 0.97 0.86 0.78 0.87  CALCIUM 8.9 9.2 8.7* 8.7* 8.6*  MG 2.1 2.1 2.2 2.3 2.3   GFR: Estimated Creatinine Clearance: 30.9 mL/min (by C-G formula based on SCr of 0.87 mg/dL). Liver Function Tests: No results for input(s): AST, ALT, ALKPHOS, BILITOT, PROT, ALBUMIN in the last 168 hours. No results for input(s): LIPASE, AMYLASE in the last 168 hours. No results for input(s): AMMONIA in the last 168 hours. Coagulation Profile: No results for input(s): INR, PROTIME in  the last 168 hours. Cardiac Enzymes: No results for input(s): CKTOTAL, CKMB, CKMBINDEX, TROPONINI in the last 168 hours. BNP (last 3 results) No results for input(s): PROBNP in the last 8760 hours. HbA1C: No results for input(s): HGBA1C in the  last 72 hours. CBG: No results for input(s): GLUCAP in the last 168 hours. Lipid Profile: No results for input(s): CHOL, HDL, LDLCALC, TRIG, CHOLHDL, LDLDIRECT in the last 72 hours. Thyroid Function Tests: No results for input(s): TSH, T4TOTAL, FREET4, T3FREE, THYROIDAB in the last 72 hours. Anemia Panel: No results for input(s): VITAMINB12, FOLATE, FERRITIN, TIBC, IRON, RETICCTPCT in the last 72 hours. Sepsis Labs: Recent Labs  Lab 05/24/20 0534  PROCALCITON <0.10    No results found for this or any previous visit (from the past 240 hour(s)).    Imaging Studies   No results found.   Medications   Scheduled Meds: . glycopyrrolate  0.3 mg Intravenous Q4H  . mouth rinse  15 mL Mouth Rinse BID   Continuous Infusions:     LOS: 11 days    Time spent: 30 minutes with >50% spent in coordination of care and direct patient contact.   Ezekiel Slocumb, DO [facility]  05/25/2020, 3:32 PM    If 7PM-7AM, please contact night-coverage. How to contact the [facility] Attending or Consulting provider [place] or covering provider during after hours [place], for this patient?    1. Check the care team in [facility] and look for a) attending/consulting [facility] provider listed and b) the [facility] team listed 2. Log into [email or web address] and use [facility]'s universal password to access. If you do not have the password, please contact the hospital operator. 3. Locate the [facility] provider you are looking for under [facility] and page to a number that you can be directly reached. 4. If you still have difficulty reaching the provider, please page the [facility] (Director on Call) for the Hospitalists listed on amion for assistance.

## 2020-05-26 DIAGNOSIS — N179 Acute kidney failure, unspecified: Secondary | ICD-10-CM | POA: Diagnosis not present

## 2020-05-26 DIAGNOSIS — J449 Chronic obstructive pulmonary disease, unspecified: Secondary | ICD-10-CM | POA: Diagnosis not present

## 2020-05-26 DIAGNOSIS — Z515 Encounter for palliative care: Secondary | ICD-10-CM | POA: Diagnosis not present

## 2020-05-26 DIAGNOSIS — A419 Sepsis, unspecified organism: Secondary | ICD-10-CM | POA: Diagnosis not present

## 2020-05-26 MED ORDER — MORPHINE SULFATE (PF) 2 MG/ML IV SOLN
1.0000 mg | INTRAVENOUS | Status: DC | PRN
  Filled 2020-05-26: qty 1

## 2020-05-26 MED ORDER — ATROPINE SULFATE 1 % OP SOLN
2.0000 [drp] | Freq: Four times a day (QID) | OPHTHALMIC | Status: DC | PRN
  Administered 2020-05-26: 2 [drp] via SUBLINGUAL
  Filled 2020-05-26: qty 2

## 2020-05-26 MED ORDER — LORAZEPAM 2 MG/ML IJ SOLN
1.0000 mg | INTRAMUSCULAR | Status: DC
  Administered 2020-05-26: 1 mg via INTRAVENOUS
  Filled 2020-05-26: qty 1

## 2020-05-26 MED ORDER — GLYCOPYRROLATE 0.2 MG/ML IJ SOLN
0.4000 mg | INTRAMUSCULAR | Status: DC
  Administered 2020-05-26: 0.4 mg via INTRAVENOUS
  Filled 2020-05-26 (×6): qty 2

## 2020-05-26 MED ORDER — DIPHENHYDRAMINE HCL 50 MG/ML IJ SOLN: 12.5000 mg | Freq: Four times a day (QID) | INTRAMUSCULAR | Status: DC | PRN

## 2020-05-26 MED ORDER — SCOPOLAMINE 1 MG/3DAYS TD PT72
1.0000 | MEDICATED_PATCH | TRANSDERMAL | Status: DC
  Administered 2020-05-26: 1.5 mg via TRANSDERMAL
  Filled 2020-05-26: qty 1

## 2020-06-20 NOTE — Death Summary Note (Cosign Needed Addendum)
Death Summary  Stephanie Collins QTM:[id number] DOB: 1932/03/31 DOA: 05-23-20  PCP: Baxter Hire, MD PCP/Office notified:   Admit date: 05/23/20 Date of Death: Jun 04, 2020  Final Diagnoses:  Principal Problem:   HCAP (healthcare-associated pneumonia) Active Problems:   Chronic obstructive pulmonary disease ([place])   Hyponatremia   Sepsis ([place])   Chronic diastolic CHF (congestive heart failure) ([place])   Acute renal failure superimposed on stage 3a chronic kidney disease ([place])   UTI (urinary tract infection)   Elevated troponin   New onset atrial fibrillation ([place])   Acute metabolic encephalopathy   HTN (hypertension)   Comfort measures only status    1. Acute Respiratory failure with hypoxia secondary to HCAP  History of present illness:  From H&P by Dr. Blaine Hamper: "[name] a 86 y.o.femalewith medical history significant ofhypertension, COPD on 2 L oxygen, lung cancer,dCHF, CKD stage III, hyponatremia, who presents withshortness of breath, diarrhea, discoloration of bilateral feet, increased urinary frequency, confusion, generalized weakness.  Per patient's daughter, patient normally is alert, oriented x3. In the past 2 days, patient has been confused. She is not oriented to time, but is still oriented to place and person. She has shortness of breath which has been progressively worsening. Denies chest pain, fever or chills. She has lost 6 pounds recently. Patient has hadat least 8 times of watery diarrhea since yesterday. No nausea,vomiting or abdominal pain. Patient has increased urinary frequency, but no dysuria or burning on urination. Patient moves all extremities. No facial droop or slurred speech. Per her daughter, patient had anepisode of dark purplish discoloration of bilateral feet. Left foot is cool. Her foot is mildly painful bilaterally.  ED Course:pt was found to have troponin125 -->630,WBC 22.7, positive urinalysis (cloudy appearance, large  amount of leukocyte, rare bacteria, WBC>50), negative Covid PCR, lactic acid 1.3, sodium 128, worsening renal function, temperature normal, soft blood pressure, tachycardia with heart rate 96, tachypnea with RR 27, oxygen saturation 90% on room air, which improved to 96% on 2 L oxygen. Chest x-ray showed bilateral basilar opacity. Patient is admitted to [place] bed as inpatient."  Hospital Course:  HCAP and UTI were treated with IV Vancomycin and Cefepime, and completed 5 days of therapy. Despite maximal therapy with broad spectrum antibiotics, patient continued to clinically decline.  Decision was made by family on 10/5 to transition to comfort measures.  Patient passed away this afternoon with family at bedside.     Time: 3734  Signed:  Ezekiel Slocumb, DO  [facility] 06-04-20, 2:51 PM

## 2020-06-20 NOTE — Care Management Important Message (Signed)
Important Message  Patient Details  Name: Stephanie Collins MRN: [id number] Date of Birth: [birth year]   Medicare Important Message Given:  Yes     Dannette Barbara 06-09-2020, 2:08 PM

## 2020-06-20 NOTE — TOC Progression Note (Signed)
Transition of Care [facility]) - Progression Note    Patient Details  Name: Stephanie Collins MRN: [id number] Date of Birth: May 31, 1932  Transition of Care [facility]) CM/SW [place], LCSW Phone Number: 2020-06-19, 9:40 AM  Clinical Narrative:  No hospice house bed available yet.   Expected Discharge Plan: [place] Barriers to Discharge: Continued Medical Work up  Expected Discharge Plan and Services Expected Discharge Plan: [place] arrangements for the past 2 months: Single Family Home                                       Social Determinants of Health (SDOH) Interventions    Readmission Risk Interventions Readmission Risk Prevention Plan 05/17/2020  Transportation Screening Complete  Medication Review Press photographer) Complete  Some recent data might be hidden

## 2020-06-20 NOTE — Progress Notes (Signed)
Daily Progress Note   Patient Name: Stephanie Collins       Date: 06-21-20 DOB: September 20, 1931  Age: 86 y.o. MRN#: [id number] Attending Physician: Ezekiel Slocumb, DO Primary Care Physician: Baxter Hire, MD Admit Date: 05/05/2020  Reason for Consultation/Follow-up: Establishing goals of care  Subjective: Patient unresponsive. Some audible secretions. Mouth breathing. Family is at the bedside (daughter and grandchildren). Updates provided.   Shared with family adjustments needed to medications for comfort/secretion management. They verbalized understanding.   Discussed with family changes expected at end-of-life and changes in patient's breathing pattern. Advised patient most likely would not transfer to the hospice home due to frailty and symptom needs. Given noticeable changes shared with family would encouraged continued visits as patient most likely would pass away today given assessment of shallow breathing and unresponsiveness.   Family tearful and verbalized understanding. They are aware to anticipate a hospital death. Family requesting patient's grandchildren be allowed to visit. We were able to facetime but they are having a hard time and requesting to be allowed to come in a few minutes and give her kiss given the closeness of their relationship. I discussed with upper leadership and they have been given permission to come in for a limited time period to express their good byes with understanding that patient most likely will pass away at today. Family verbalized appreciation.   All questions answered and emotional support provided.   Length of Stay: 12 days  Vital Signs: BP 92/63 (BP Location: Left Arm)   Pulse 91   Temp 97.7 F (36.5 C)   Resp (!) 22   Ht 5' (1.524 m)   Wt 42.2 kg   SpO2 93%   BMI 18.16 kg/m  SpO2: SpO2: 93 % O2 Device: O2 Device: Nasal Cannula O2 Flow Rate: O2 Flow Rate (L/min): 4 L/min  Physical Exam: -unresponsive, actively dying -shallow breathing  pattern -mottling noted              Palliative Care Assessment & Plan  HPI: Palliative Care consult requested for goals of care discussion in this 86 y.o. female with multiple medical problems including hypertension, COPD (2L home oxygen), lung cancer s/p right lower lobectomy (2010),dCHF, CKD stage III, and hyponatremia.  Patient presented to the ED with complaints of shortness of breath, discoloration of bilateral feet, increased urinary frequency, confusion, and generalized weakness.  Daughter also reports diarrhea.  During work-up urinalysis positive for UTI, Covid PCR negative, WBC 22.7, chest x-ray showed bilateral basilar opacity.  Since admission patient continues to show minimal improvement.  Completed 5-day course of antibiotics.  Code Status:  DNR  Goals of Care/Recommendations:  Continue with full comfort measures  Patient is unstable in symptoms and actively dying. Anticipate hospital death. Unable to transfer to hospice home. Education and updates provided to family.   Great-grandchildren allowed to visit for limited time period. I have discussed and gained additional approval from upper leadership.   Scopolamine patch for secretions  Increase Robinul dosing  Scheduled ativan for restlessness/symptom management  Decreased oxygen to 2L/[place] or less   Increased PRN frequency of morphine for comfort/air hunger  PMT will continue to support    Prognosis: Hours - Days  Discharge Planning: Anticipated Hospital Death  Thank you for allowing the Palliative Medicine Team to assist in the care of this patient.  Time Total: 35 min.   Visit consisted of counseling and education dealing with the complex and emotionally intense issues of symptom management and palliative  care in the setting of serious and potentially life-threatening illness.Greater than 50%  of this time was spent counseling and coordinating care related to the above assessment and plan.  Alda Lea, AGPCNP-BC  Palliative Medicine Team [phone]

## 2020-06-20 NOTE — Progress Notes (Signed)
[facility] Liaison note:  Follow up visit made to new referral for [facility] home. Family at bedside. Palliative NP has adjusted comfort medications.  Emotional support and end of life education provided.  Family and hospital care team made aware that [facility] does not have bed availability today. . Will continue to follow and update regarding bed availability. Flo Shanks BSN, RN, [place] [phone]

## 2020-06-20 DEATH — deceased

## 2021-11-03 IMAGING — DX DG CHEST 1V PORT
1 series · 1 of 1 positions shown · non-contrast
Comparison: Chest radiograph April 29, 2020

CLINICAL DATA: Patient with weakness.  History of lung cancer.

EXAM:
PORTABLE CHEST 1 VIEW

[chest ap]
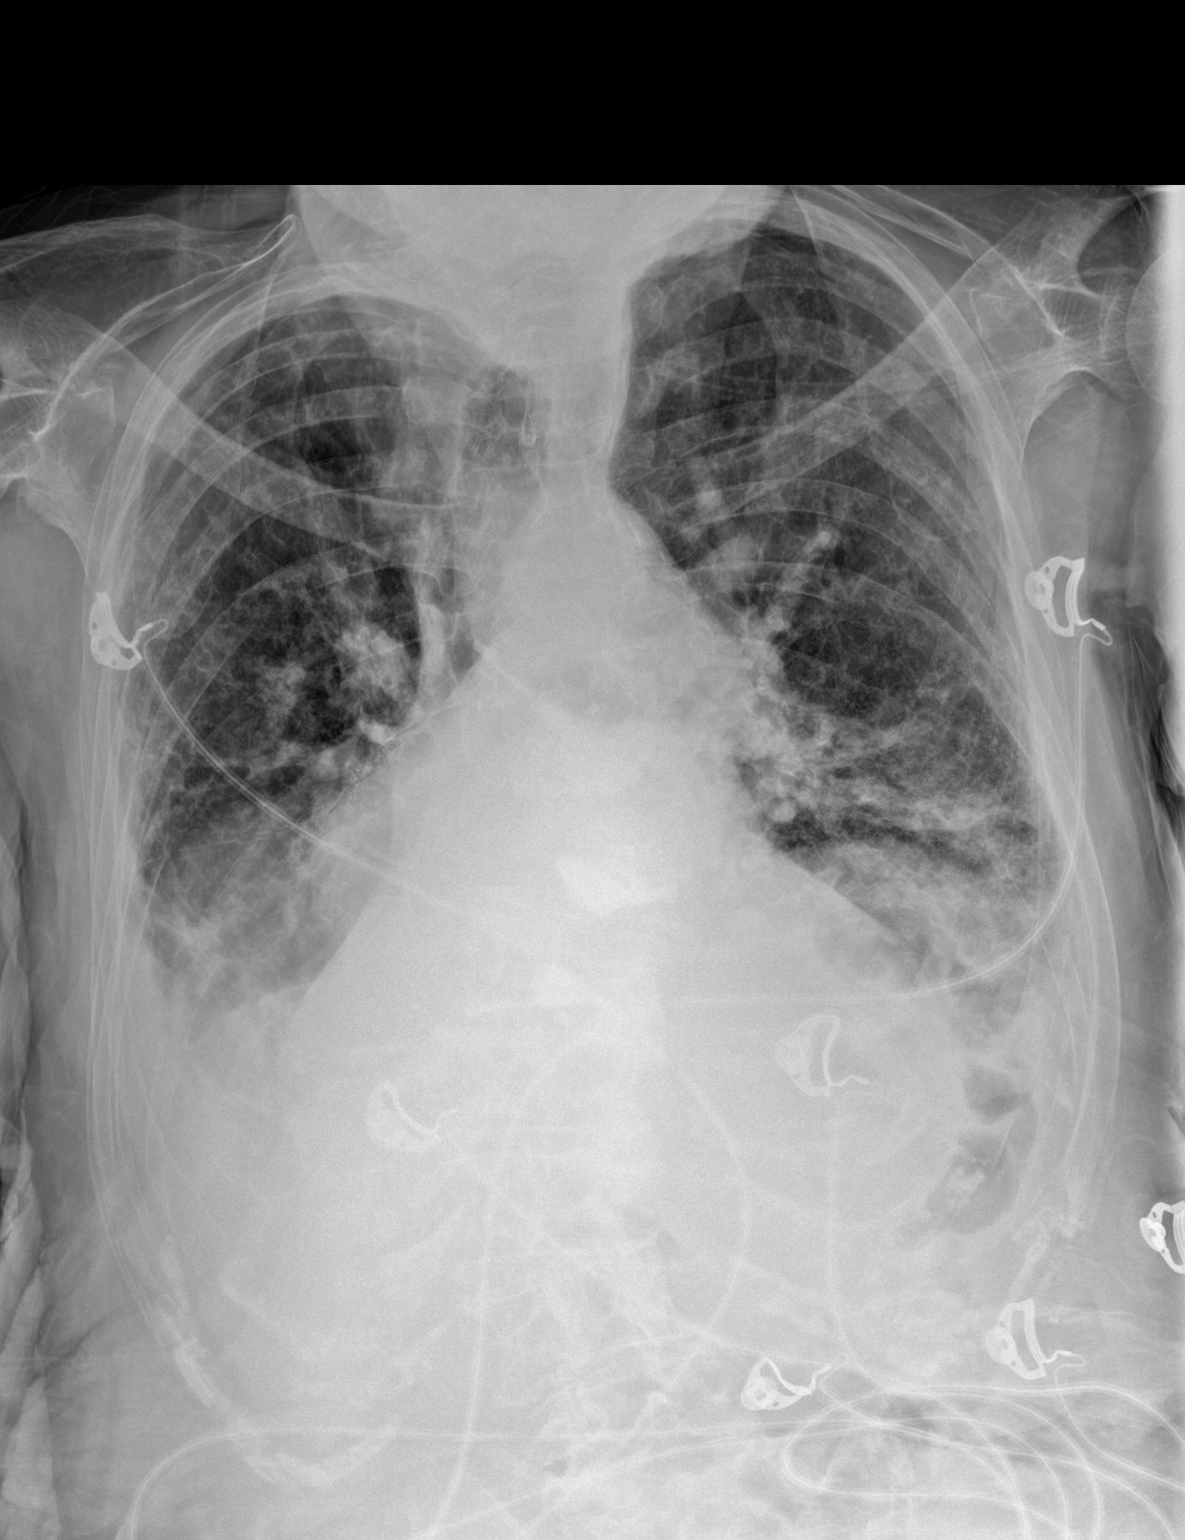

[1 of 1 positions shown; findings below may reference images not displayed]

FINDINGS: Monitoring leads overlie the patient. Stable cardiac and mediastinal
contours. Bilateral pulmonary vascular redistribution. Mild
interstitial opacities. Small bilateral pleural effusions.
IMPRESSION: Increased bibasilar airspace opacities which may represent
atelectasis or infection.

Probable small bilateral pleural effusions.

Bilateral interstitial opacities may represent edema.
